# Patient Record
Sex: Female | Born: 1995 | Race: Black or African American | Hispanic: No | Smoking: Current every day smoker
Health system: Southern US, Community
[De-identification: ages and names within clinical notes are randomized; demographics above are authoritative.]

## PROBLEM LIST (undated history)

## (undated) ENCOUNTER — Inpatient Hospital Stay: Payer: Self-pay

## (undated) DIAGNOSIS — Z789 Other specified health status: Secondary | ICD-10-CM

## (undated) DIAGNOSIS — Z8759 Personal history of other complications of pregnancy, childbirth and the puerperium: Secondary | ICD-10-CM

## (undated) DIAGNOSIS — F329 Major depressive disorder, single episode, unspecified: Secondary | ICD-10-CM

## (undated) DIAGNOSIS — F32A Depression, unspecified: Secondary | ICD-10-CM

## (undated) DIAGNOSIS — K219 Gastro-esophageal reflux disease without esophagitis: Secondary | ICD-10-CM

---
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
## 2015-02-19 ENCOUNTER — Emergency Department
Admission: EM | Admit: 2015-02-19 | Discharge: 2015-02-19 | Disposition: A | Discharge status: Home / Self Care | Payer: Medicaid Other | Attending: Emergency Medicine | Admitting: Emergency Medicine

## 2015-02-19 ENCOUNTER — Encounter: Payer: Self-pay | Admitting: *Deleted

## 2015-02-19 DIAGNOSIS — R1084 Generalized abdominal pain: Secondary | ICD-10-CM | POA: Diagnosis not present

## 2015-02-19 DIAGNOSIS — Z3202 Encounter for pregnancy test, result negative: Secondary | ICD-10-CM | POA: Insufficient documentation

## 2015-02-19 DIAGNOSIS — R1110 Vomiting, unspecified: Secondary | ICD-10-CM

## 2015-02-19 DIAGNOSIS — R197 Diarrhea, unspecified: Secondary | ICD-10-CM | POA: Insufficient documentation

## 2015-02-19 DIAGNOSIS — Z79899 Other long term (current) drug therapy: Secondary | ICD-10-CM | POA: Insufficient documentation

## 2015-02-19 LAB — URINALYSIS COMPLETE WITH MICROSCOPIC (ARMC ONLY)
Bilirubin Urine: NEGATIVE
GLUCOSE, UA: NEGATIVE mg/dL
Ketones, ur: NEGATIVE mg/dL
LEUKOCYTES UA: NEGATIVE
NITRITE: NEGATIVE
PROTEIN: 30 mg/dL — AB
SPECIFIC GRAVITY, URINE: 1.029 (ref 1.005–1.030)
pH: 6 (ref 5.0–8.0)

## 2015-02-19 LAB — COMPREHENSIVE METABOLIC PANEL
ALK PHOS: 59 U/L (ref 38–126)
ALT: 20 U/L (ref 14–54)
ANION GAP: 9 (ref 5–15)
AST: 25 U/L (ref 15–41)
Albumin: 4.4 g/dL (ref 3.5–5.0)
BILIRUBIN TOTAL: 0.9 mg/dL (ref 0.3–1.2)
BUN: 13 mg/dL (ref 6–20)
CO2: 22 mmol/L (ref 22–32)
Calcium: 9.2 mg/dL (ref 8.9–10.3)
Chloride: 106 mmol/L (ref 101–111)
Creatinine, Ser: 0.74 mg/dL (ref 0.44–1.00)
Glucose, Bld: 119 mg/dL — ABNORMAL HIGH (ref 65–99)
Potassium: 3.9 mmol/L (ref 3.5–5.1)
Sodium: 137 mmol/L (ref 135–145)
TOTAL PROTEIN: 8.1 g/dL (ref 6.5–8.1)

## 2015-02-19 LAB — LIPASE, BLOOD: Lipase: 20 U/L (ref 11–51)

## 2015-02-19 LAB — CBC
HCT: 42.5 % (ref 35.0–47.0)
HEMOGLOBIN: 14 g/dL (ref 12.0–16.0)
MCH: 27.3 pg (ref 26.0–34.0)
MCHC: 32.9 g/dL (ref 32.0–36.0)
MCV: 82.9 fL (ref 80.0–100.0)
Platelets: 177 10*3/uL (ref 150–440)
RBC: 5.12 MIL/uL (ref 3.80–5.20)
RDW: 15.5 % — AB (ref 11.5–14.5)
WBC: 5.2 10*3/uL (ref 3.6–11.0)

## 2015-02-19 LAB — POCT PREGNANCY, URINE: Preg Test, Ur: NEGATIVE

## 2015-02-19 MED ORDER — ONDANSETRON 4 MG PO TBDP
4.0000 mg | ORAL_TABLET | Freq: Once | ORAL | Status: AC
  Administered 2015-02-19: 4 mg via ORAL

## 2015-02-19 MED ORDER — ONDANSETRON 4 MG PO TBDP
4.0000 mg | ORAL_TABLET | Freq: Once | ORAL | Status: DC | PRN
  Filled 2015-02-19: qty 1

## 2015-02-19 MED ORDER — LOPERAMIDE HCL 2 MG PO CAPS
4.0000 mg | ORAL_CAPSULE | Freq: Once | ORAL | Status: AC
  Administered 2015-02-19: 4 mg via ORAL
  Filled 2015-02-19 (×2): qty 2
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
## 2015-02-19 NOTE — ED Notes (Signed)
AAOx3.  Skin warm and dry.  NAD.  Ambulates with easy and steady gait.  Patient goind home with Taxi.  Voucher given to First Nurse.

## 2015-02-19 NOTE — Discharge Instructions (Signed)

## 2015-02-19 NOTE — ED Notes (Signed)
Pt states vomiting and diaherra since last night, awake and alert in no acute distress

## 2015-02-20 ENCOUNTER — Emergency Department
Admission: EM | Admit: 2015-02-20 | Discharge: 2015-02-21 | Disposition: A | Discharge status: Home / Self Care | Payer: Medicaid Other

## 2015-04-09 ENCOUNTER — Encounter: Payer: Self-pay | Admitting: Emergency Medicine

## 2015-04-09 ENCOUNTER — Emergency Department
Admission: EM | Admit: 2015-04-09 | Discharge: 2015-04-09 | Disposition: A | Discharge status: Home / Self Care | Payer: Medicaid Other | Attending: Emergency Medicine | Admitting: Emergency Medicine

## 2015-04-09 DIAGNOSIS — J020 Streptococcal pharyngitis: Secondary | ICD-10-CM | POA: Insufficient documentation

## 2015-04-09 DIAGNOSIS — J029 Acute pharyngitis, unspecified: Secondary | ICD-10-CM | POA: Diagnosis present

## 2015-04-09 LAB — POCT RAPID STREP A: Streptococcus, Group A Screen (Direct): POSITIVE — AB

## 2015-04-09 MED ORDER — ACETAMINOPHEN 325 MG PO TABS
650.0000 mg | ORAL_TABLET | Freq: Once | ORAL | Status: AC | PRN
  Administered 2015-04-09: 650 mg via ORAL
  Filled 2015-04-09: qty 2

## 2015-04-09 MED ORDER — AMOXICILLIN 250 MG PO CHEW: 500.0000 mg | CHEWABLE_TABLET | Freq: Three times a day (TID) | ORAL | Status: DC

## 2015-04-09 MED ORDER — IBUPROFEN 600 MG PO TABS: 600.0000 mg | ORAL_TABLET | Freq: Four times a day (QID) | ORAL | Status: DC | PRN

## 2015-04-09 NOTE — ED Notes (Signed)
Pt to ed with c/o sore throat, cough, congestion since last night. Pt states body aches at this time.

## 2015-04-09 NOTE — ED Notes (Signed)
AAOx3.  Skin warm and dry.  NAD 

## 2015-04-09 NOTE — ED Notes (Signed)
Sore throat since yesterday.  Swollen throat.

## 2015-04-09 NOTE — ED Provider Notes (Signed)
CSN: [id number]     Arrival date & time 04/09/15  1331 History   First MD Initiated Contact with Patient 04/09/15 1517     Chief Complaint  Patient presents with  . Sore Throat     (Consider location/radiation/quality/duration/timing/severity/associated sxs/prior Treatment) HPI 20 year old female who presents to the emergency department with a sore throat, occasional cough, and congestion since last night. She is also complaining of generalized body aches. She has had no known sick exposures. She has not taken any medications to relieve her symptoms. History reviewed. No pertinent past medical history. History reviewed. No pertinent past surgical history. No family history on file. Social History  Substance Use Topics  . Smoking status: Never Smoker   . Smokeless tobacco: Never Used  . Alcohol Use: No   OB History    Gravida Para Term Preterm AB TAB SAB Ectopic Multiple Living   1              Review of Systems  Constitutional: Positive for fever and activity change.  HENT: Positive for sore throat. Negative for ear pain, rhinorrhea and trouble swallowing.   Eyes: Negative.   Respiratory: Positive for cough. Negative for shortness of breath.   Gastrointestinal: Negative for abdominal pain.  Musculoskeletal: Positive for myalgias.  Skin: Negative.   Neurological: Negative.       Allergies  Review of patient's allergies indicates no known allergies.  Home Medications   Prior to Admission medications   Medication Sig Start Date End Date Taking? Authorizing Provider  amoxicillin (AMOXIL) 250 MG chewable tablet Chew 2 tablets (500 mg total) by mouth 3 (three) times daily. 04/09/15   Chinita Pesterari B Shaleka Brines, FNP  ibuprofen (ADVIL,MOTRIN) 600 MG tablet Take 1 tablet (600 mg total) by mouth every 6 (six) hours as needed. 04/09/15   Chinita Pesterari B Kathie Posa, FNP  Prenatal Vit-Fe Fumarate-FA (PRENATAL MULTIVITAMIN) TABS tablet Take 1 tablet by mouth daily at 12 noon.    Historical Provider, MD   BP  125/74 mmHg  Pulse 110  Temp(Src) 100.5 F (38.1 C) (Oral)  Resp 16  Ht 5\' 2"  (1.575 m)  Wt 65.772 kg  BMI 26.51 kg/m2  SpO2 98%  LMP 03/15/2014 Physical Exam  Constitutional: She is oriented to person, place, and time. She appears well-developed and well-nourished.  HENT:  Head: Atraumatic.  Right Ear: External ear normal.  Left Ear: External ear normal.  Mouth/Throat: Uvula is midline and mucous membranes are normal. Oropharyngeal exudate and posterior oropharyngeal erythema present. No posterior oropharyngeal edema or tonsillar abscesses.  Neck: Normal range of motion.  Pulmonary/Chest: Effort normal and breath sounds normal.  Abdominal: Soft. There is no tenderness.  Musculoskeletal: Normal range of motion.  Lymphadenopathy:    She has no cervical adenopathy.  Neurological: She is alert and oriented to person, place, and time.  Skin: Skin is warm and dry.  Psychiatric: Her behavior is normal. Thought content normal.    ED Course  Procedures (including critical care time) Labs Review Labs Reviewed  POCT RAPID STREP A - Abnormal; Notable for the following:    Streptococcus, Group A Screen (Direct) POSITIVE (*)    All other components within normal limits    Imaging Review No results found. I have personally reviewed and evaluated these images and lab results as part of my medical decision-making.   EKG Interpretation None      MDM   Final diagnoses:  Strep pharyngitis    Patient was given a prescription for amoxicillin and ibuprofen.  She was encouraged to take the amoxicillin until-. She was advised to follow-up with her primary care provider for symptoms that are not improving over the next 48-72 hours. She was encouraged to return to the emergency department for symptoms that change or worsen if she is unable schedule an appointment.    Chinita Pester, FNP 04/09/15 1542  Phineas Semen, MD 04/09/15 [phone]

## 2015-04-15 ENCOUNTER — Emergency Department: Payer: Medicaid Other

## 2015-04-15 ENCOUNTER — Emergency Department
Admission: EM | Admit: 2015-04-15 | Discharge: 2015-04-15 | Disposition: A | Discharge status: Home / Self Care | Payer: Medicaid Other | Attending: Emergency Medicine | Admitting: Emergency Medicine

## 2015-04-15 ENCOUNTER — Encounter: Payer: Self-pay | Admitting: Emergency Medicine

## 2015-04-15 DIAGNOSIS — W230XXA Caught, crushed, jammed, or pinched between moving objects, initial encounter: Secondary | ICD-10-CM | POA: Insufficient documentation

## 2015-04-15 DIAGNOSIS — M25572 Pain in left ankle and joints of left foot: Secondary | ICD-10-CM | POA: Diagnosis present

## 2015-04-15 DIAGNOSIS — S9002XA Contusion of left ankle, initial encounter: Secondary | ICD-10-CM | POA: Insufficient documentation

## 2015-04-15 DIAGNOSIS — Y999 Unspecified external cause status: Secondary | ICD-10-CM | POA: Insufficient documentation

## 2015-04-15 DIAGNOSIS — Y929 Unspecified place or not applicable: Secondary | ICD-10-CM | POA: Insufficient documentation

## 2015-04-15 DIAGNOSIS — S9032XA Contusion of left foot, initial encounter: Secondary | ICD-10-CM

## 2015-04-15 DIAGNOSIS — Y9389 Activity, other specified: Secondary | ICD-10-CM | POA: Diagnosis not present

## 2015-04-15 MED ORDER — IBUPROFEN 600 MG PO TABS: 600.0000 mg | ORAL_TABLET | Freq: Three times a day (TID) | ORAL | Status: DC | PRN

## 2015-04-15 NOTE — Discharge Instructions (Signed)
Contusion A contusion is a deep bruise. Contusions happen when an injury causes bleeding under the skin. Symptoms of bruising include pain, swelling, and discolored skin. The skin may turn blue, purple, or yellow. HOME CARE   Rest the injured area.  If told, put ice on the injured area.  Put ice in a plastic bag.  Place a towel between your skin and the bag.  Leave the ice on for 20 minutes, 2-3 times per day.  If told, put light pressure (compression) on the injured area using an elastic bandage. Make sure the bandage is not too tight. Remove it and put it back on as told by your doctor.  If possible, raise (elevate) the injured area above the level of your heart while you are sitting or lying down.  Take over-the-counter and prescription medicines only as told by your doctor. GET HELP IF:  Your symptoms do not get better after several days of treatment.  Your symptoms get worse.  You have trouble moving the injured area. GET HELP RIGHT AWAY IF:   You have very bad pain.  You have a loss of feeling (numbness) in a hand or foot.  Your hand or foot turns pale or cold.   This information is not intended to replace advice given to you by your health care provider. Make sure you discuss any questions you have with your health care provider.   Document Released: 06/11/2007 Document Revised: 09/13/2014 Document Reviewed: 05/10/2014 Elsevier Interactive Patient Education 2016 [employer]vier Inc.  Cryotherapy Cryotherapy is when you put ice on your injury. Ice helps lessen pain and puffiness (swelling) after an injury. Ice works the best when you start using it in the first 24 to 48 hours after an injury. HOME CARE  Put a dry or damp towel between the ice pack and your skin.  You may press gently on the ice pack.  Leave the ice on for no more than 10 to 20 minutes at a time.  Check your skin after 5 minutes to make sure your skin is okay.  Rest at least 20 minutes between ice  pack uses.  Stop using ice when your skin loses feeling (numbness).  Do not use ice on someone who cannot tell you when it hurts. This includes small children and people with memory problems (dementia). GET HELP RIGHT AWAY IF:  You have white spots on your skin.  Your skin turns blue or pale.  Your skin feels waxy or hard.  Your puffiness gets worse. MAKE SURE YOU:   Understand these instructions.  Will watch your condition.  Will get help right away if you are not doing well or get worse.   This information is not intended to replace advice given to you by your health care provider. Make sure you discuss any questions you have with your health care provider.   Document Released: 06/11/2007 Document Revised: 03/17/2011 Document Reviewed: 08/15/2010 Elsevier Interactive Patient Education 2016 [employer]   Ice and elevate if needed for pain or swelling. Take ibuprofen as needed for pain and inflammation. Follow[facility] with your primary care doctor or [facility] if any continued problems.
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
## 2015-05-07 ENCOUNTER — Encounter: Payer: Self-pay | Admitting: Emergency Medicine

## 2015-05-07 ENCOUNTER — Emergency Department
Admission: EM | Admit: 2015-05-07 | Discharge: 2015-05-07 | Disposition: A | Discharge status: Home / Self Care | Payer: Medicaid Other | Attending: Emergency Medicine | Admitting: Emergency Medicine

## 2015-05-07 ENCOUNTER — Emergency Department: Payer: Medicaid Other

## 2015-05-07 DIAGNOSIS — N939 Abnormal uterine and vaginal bleeding, unspecified: Secondary | ICD-10-CM

## 2015-05-07 DIAGNOSIS — O039 Complete or unspecified spontaneous abortion without complication: Secondary | ICD-10-CM

## 2015-05-07 DIAGNOSIS — O209 Hemorrhage in early pregnancy, unspecified: Secondary | ICD-10-CM | POA: Diagnosis present

## 2015-05-07 LAB — CBC WITH DIFFERENTIAL/PLATELET
BASOS PCT: 1 %
Basophils Absolute: 0.1 10*3/uL (ref 0–0.1)
EOS PCT: 3 %
Eosinophils Absolute: 0.2 10*3/uL (ref 0–0.7)
HCT: 41.1 % (ref 35.0–47.0)
Hemoglobin: 13.7 g/dL (ref 12.0–16.0)
LYMPHS ABS: 3.2 10*3/uL (ref 1.0–3.6)
Lymphocytes Relative: 57 %
MCH: 28.3 pg (ref 26.0–34.0)
MCHC: 33.4 g/dL (ref 32.0–36.0)
MCV: 84.7 fL (ref 80.0–100.0)
MONOS PCT: 9 %
Monocytes Absolute: 0.5 10*3/uL (ref 0.2–0.9)
NEUTROS ABS: 1.7 10*3/uL (ref 1.4–6.5)
Neutrophils Relative %: 30 %
PLATELETS: 167 10*3/uL (ref 150–440)
RBC: 4.85 MIL/uL (ref 3.80–5.20)
RDW: 14.3 % (ref 11.5–14.5)
WBC: 5.7 10*3/uL (ref 3.6–11.0)

## 2015-05-07 LAB — ABO/RH: ABO/RH(D): O POS

## 2015-05-07 LAB — HCG, QUANTITATIVE, PREGNANCY: hCG, Beta Chain, Quant, S: <1 m[IU]/mL (ref <5)

## 2015-05-07 NOTE — ED Notes (Addendum)
Pt states she is [redacted] weeks pregnant has had light bleeding for the past week and woke up this morning with heavier vaginal bleeding and lower abd cramping.the patient is in NAD on arrival..

## 2015-05-07 NOTE — Discharge Instructions (Signed)
Miscarriage  A miscarriage is the sudden loss of an unborn baby (fetus) before the 20th week of pregnancy. Most miscarriages happen in the first 3 months of pregnancy. Sometimes, it happens before a woman even knows she is pregnant. A miscarriage is also called a "spontaneous miscarriage" or "early pregnancy loss." Having a miscarriage can be an emotional experience. Talk with your caregiver about any questions you may have about miscarrying, the grieving process, and your future pregnancy plans.  CAUSES    Problems with the fetal chromosomes that make it impossible for the baby to develop normally. Problems with the baby's genes or chromosomes are most often the result of errors that occur, by chance, as the embryo divides and grows. The problems are not inherited from the parents.   Infection of the cervix or uterus.    Hormone problems.    Problems with the cervix, such as having an incompetent cervix. This is when the tissue in the cervix is not strong enough to hold the pregnancy.    Problems with the uterus, such as an abnormally shaped uterus, uterine fibroids, or congenital abnormalities.    Certain medical conditions.    Smoking, drinking alcohol, or taking illegal drugs.    Trauma.   Often, the cause of a miscarriage is unknown.   SYMPTOMS    Vaginal bleeding or spotting, with or without cramps or pain.   Pain or cramping in the abdomen or lower back.   Passing fluid, tissue, or blood clots from the vagina.  DIAGNOSIS   Your caregiver will perform a physical exam. You may also have an ultrasound to confirm the miscarriage. Blood or urine tests may also be ordered.  TREATMENT    Sometimes, treatment is not necessary if you naturally pass all the fetal tissue that was in the uterus. If some of the fetus or placenta remains in the body (incomplete miscarriage), tissue left behind may become infected and must be removed. Usually, a dilation and curettage (D and C) procedure is performed.  During a D and C procedure, the cervix is widened (dilated) and any remaining fetal or placental tissue is gently removed from the uterus.   Antibiotic medicines are prescribed if there is an infection. Other medicines may be given to reduce the size of the uterus (contract) if there is a lot of bleeding.   If you have Rh negative blood and your baby was Rh positive, you will need a Rh immunoglobulin shot. This shot will protect any future baby from having Rh blood problems in future pregnancies.  HOME CARE INSTRUCTIONS    Your caregiver may order bed rest or may allow you to continue light activity. Resume activity as directed by your caregiver.   Have someone help with home and family responsibilities during this time.    Keep track of the number of sanitary pads you use each day and how soaked (saturated) they are. Write down this information.    Do not use tampons. Do not douche or have sexual intercourse until approved by your caregiver.    Only take over-the-counter or prescription medicines for pain or discomfort as directed by your caregiver.    Do not take aspirin. Aspirin can cause bleeding.    Keep all follow-up appointments with your caregiver.    If you or your partner have problems with grieving, talk to your caregiver or seek counseling to help cope with the pregnancy loss. Allow enough time to grieve before trying to get pregnant again.     SEEK IMMEDIATE MEDICAL CARE IF:    You have severe cramps or pain in your back or abdomen.   You have a fever.   You pass large blood clots (walnut-sized or larger) ortissue from your vagina. Save any tissue for your caregiver to inspect.    Your bleeding increases.    You have a thick, bad-smelling vaginal discharge.   You become lightheaded, weak, or you faint.    You have chills.   MAKE SURE YOU:   Understand these instructions.   Will watch your condition.   Will get help right away if you are not doing well or get worse.     This  information is not intended to replace advice given to you by your health care provider. Make sure you discuss any questions you have with your health care provider.     Document Released: 06/18/2000 Document Revised: 04/19/2012 Document Reviewed: 02/11/2011  Elsevier Interactive Patient Education 2016 [employer]

## 2015-05-07 NOTE — ED Provider Notes (Signed)
[facility] Emergency Department Provider Note  ____________________________________________  Time seen: 9:20 AM  I have reviewed the triage vital signs and the nursing notes.   HISTORY  Chief Complaint Vaginal Bleeding    HPI Maria Richmond is a 20 y.o. female who reports being about [redacted] weeks pregnant. She says she's had light vaginal bleeding for the past week, which today became heavy. She also had some abdominal cramping. No chest pain shortness of breath fevers chills or dizziness. As a history of placenta previa in a previous pregnancy one year ago.     History reviewed. No pertinent past medical history.   There are no active problems to display for this patient.    Past Surgical History  Procedure Laterality Date  . Cesarean section       No current outpatient prescriptions on file. None  Allergies Review of patient's allergies indicates no known allergies.   No family history on file.  Social History Social History  Substance Use Topics  . Smoking status: Never Smoker   . Smokeless tobacco: None  . Alcohol Use: No    Review of Systems  Constitutional:   No fever or chills.  Eyes:   No vision changes.  ENT:   No sore throat. No rhinorrhea. Cardiovascular:   No chest pain. Respiratory:   No dyspnea or cough. Gastrointestinal:   Negative for abdominal pain, vomiting and diarrhea.  Genitourinary:   Negative for dysuria or difficulty urinating. Musculoskeletal:   Negative for focal pain or swelling Neurological:   Negative for headaches 10-point ROS otherwise negative.  ____________________________________________   PHYSICAL EXAM:  VITAL SIGNS: ED Triage Vitals  Enc Vitals Group     BP 05/07/15 0744 131/109 mmHg     Pulse Rate 05/07/15 0744 82     Resp 05/07/15 0744 16     Temp 05/07/15 0744 98.1 F (36.7 C)     Temp Source 05/07/15 0744 Oral     SpO2 05/07/15 0744 100 %     Weight 05/07/15 0741 140 lb (63.504  kg)     Height 05/07/15 0741 5\' 2"  (1.575 m)     Head Cir --      Peak Flow --      Pain Score 05/07/15 0741 7     Pain Loc --      Pain Edu? --      Excl. in GC? --     Vital signs reviewed, nursing assessments reviewed.   Constitutional:   Alert and oriented. Well appearing and in no distress. Eyes:   No scleral icterus. No conjunctival pallor. PERRL. EOMI.  No nystagmus. ENT   Head:   Normocephalic and atraumatic.   Nose:   No congestion/rhinnorhea. No septal hematoma   Mouth/Throat:   MMM, no pharyngeal erythema. No peritonsillar mass.    Neck:   No stridor. No SubQ emphysema. No meningismus. Hematological/Lymphatic/Immunilogical:   No cervical lymphadenopathy. Cardiovascular:   RRR. Symmetric bilateral radial and DP pulses.  No murmurs.  Respiratory:   Normal respiratory effort without tachypnea nor retractions. Breath sounds are clear and equal bilaterally. No wheezes/rales/rhonchi. Gastrointestinal:   Soft and nontender. Non distended. There is no CVA tenderness.  No rebound, rigidity, or guarding. Genitourinary:   deferred Musculoskeletal:   Nontender with normal range of motion in all extremities. No joint effusions.  No lower extremity tenderness.  No edema. Neurologic:   Normal speech and language.  CN 2-10 normal. Motor grossly intact. No gross focal neurologic  deficits are appreciated.  Skin:    Skin is warm, dry and intact. No rash noted.  No petechiae, purpura, or bullae.  ____________________________________________    LABS (pertinent positives/negatives) (all labs ordered are listed, but only abnormal results are displayed) Labs Reviewed  HCG, QUANTITATIVE, PREGNANCY  CBC WITH DIFFERENTIAL/PLATELET  URINALYSIS COMPLETEWITH MICROSCOPIC ([facility] ONLY)  ABO/RH   ____________________________________________   EKG    ____________________________________________    RADIOLOGY  Ultrasound pelvis unremarkable. No retained POC's, no evidence of  ectopic.  ____________________________________________   PROCEDURES   ____________________________________________   INITIAL IMPRESSION / ASSESSMENT AND PLAN / ED COURSE  Pertinent labs & imaging results that were available during my care of the patient were reviewed by me and considered in my medical decision making (see chart for details).  Patient well appearing no acute distress. Reports pregnancy with vaginal bleeding. HCG is negative. Ultrasound unremarkable, blood type is Rh+. Follow-up with primary care, currently stable. No evidence of ectopic. Low suspicion for STI PID or torsion. No evidence of appendicitis.     ____________________________________________   FINAL CLINICAL IMPRESSION(S) / ED DIAGNOSES  Final diagnoses:  Vaginal bleeding  Spontaneous miscarriage       Portions of this note were generated with dragon dictation software. Dictation errors may occur despite best attempts at proofreading.   Sharman Cheek, MD 05/07/15 1137

## 2015-12-09 ENCOUNTER — Emergency Department
Admission: EM | Admit: 2015-12-09 | Discharge: 2015-12-09 | Disposition: A | Discharge status: Home / Self Care | Payer: Medicaid Other | Attending: Emergency Medicine | Admitting: Emergency Medicine

## 2015-12-09 ENCOUNTER — Encounter: Payer: Self-pay | Admitting: Emergency Medicine

## 2015-12-09 DIAGNOSIS — F17210 Nicotine dependence, cigarettes, uncomplicated: Secondary | ICD-10-CM | POA: Diagnosis not present

## 2015-12-09 DIAGNOSIS — Z791 Long term (current) use of non-steroidal anti-inflammatories (NSAID): Secondary | ICD-10-CM | POA: Diagnosis not present

## 2015-12-09 DIAGNOSIS — B349 Viral infection, unspecified: Secondary | ICD-10-CM | POA: Insufficient documentation

## 2015-12-09 DIAGNOSIS — J029 Acute pharyngitis, unspecified: Secondary | ICD-10-CM | POA: Diagnosis present

## 2015-12-09 DIAGNOSIS — Z792 Long term (current) use of antibiotics: Secondary | ICD-10-CM | POA: Insufficient documentation

## 2015-12-09 LAB — INFLUENZA PANEL BY PCR (TYPE A & B)
INFLAPCR: NEGATIVE
Influenza B By PCR: NEGATIVE

## 2015-12-09 MED ORDER — KETOROLAC TROMETHAMINE 60 MG/2ML IM SOLN: 60.0000 mg | Freq: Once | INTRAMUSCULAR | Status: DC

## 2015-12-09 MED ORDER — NAPROXEN 500 MG PO TBEC: 500.0000 mg | DELAYED_RELEASE_TABLET | Freq: Two times a day (BID) | ORAL | 0 refills | Status: DC

## 2015-12-09 NOTE — ED Provider Notes (Signed)
[facility] Emergency Department Provider Note  ____________________________________________  Time seen: Approximately 10:56 PM  I have reviewed the triage vital signs and the nursing notes.   HISTORY  Chief Complaint Influenza   HPI Maria Richmond is a 20 y.o. female resenting with pharyngitis, headache, nonproductive cough and rhinorrhea for one day. Patient states that her boyfriend has the exact same symptoms. She denies chest pain, shortness of breath, hematemesis, nausea, vomiting or abdominal pain. She denies attempting alleviating factors. She denies recent travel and fever.   History reviewed. No pertinent past medical history.  Patient Active Problem List   Diagnosis Date Noted  . Preterm premature rupture of membranes (PPROM) with unknown onset of labor 07/17/2014  . Decreased amniotic fluid 07/04/2014    Past Surgical History:  Procedure Laterality Date  . CESAREAN SECTION      Prior to Admission medications   Medication Sig Start Date End Date Taking? Authorizing Provider  amoxicillin (AMOXIL) 250 MG chewable tablet Chew 2 tablets (500 mg total) by mouth 3 (three) times daily. 04/09/15   Chinita Pesterari B Triplett, FNP  ibuprofen (ADVIL,MOTRIN) 600 MG tablet Take 1 tablet (600 mg total) by mouth every 8 (eight) hours as needed. 04/15/15   Tommi Rumpshonda L Summers, PA-C  Prenatal Vit-Fe Fumarate-FA (PRENATAL MULTIVITAMIN) TABS tablet Take 1 tablet by mouth daily at 12 noon.    Historical Provider, MD    Allergies Patient has no known allergies.  No family history on file.  Social History Social History  Substance Use Topics  . Smoking status: Current Every Day Smoker    Packs/day: 1.00    Types: Cigarettes  . Smokeless tobacco: Never Used  . Alcohol use Yes    Review of Systems Constitutional: Denies fever/chills ENT: Has sore throat. Cardiovascular: Denies chest pain. Respiratory: Denies shortness of breath. Has cough. Gastrointestinal: Denies  nausea,  denies vomiting.  denies diarrhea.  Musculoskeletal: Postive for body aches Skin: Negative for rash. Neurological: Positive for headaches ____________________________________________   PHYSICAL EXAM:  VITAL SIGNS: ED Triage Vitals  Enc Vitals Group     BP 12/09/15 2115 (!) 132/94     Pulse Rate 12/09/15 2115 (!) 108     Resp 12/09/15 2115 18     Temp 12/09/15 2115 99.3 F (37.4 C)     Temp Source 12/09/15 2115 Oral     SpO2 12/09/15 2115 100 %     Weight 12/09/15 2116 140 lb (63.5 kg)     Height 12/09/15 2116 5\' 2"  (1.575 m)     Head Circumference --      Peak Flow --      Pain Score 12/09/15 2120 6     Pain Loc --      Pain Edu? --      Excl. in GC? --     Constitutional: Alert and oriented. Supine on bed. Eyes: Conjunctivae are normal. EOMI. Ears: Right tympanic membrane is injected without exudate. Left tympanic membrane is without erythema or exudate. Nose: Patient's nasal turbinates are boggy with rhinorrhea visualized on physical exam. Mouth/Throat: Posterior pharynx is mildly erythematous without petechiae or tonsillar exudate. Tonsils 2+. No deviation of the uvula. Neck: No stridor.  Lymphatic: Patient has palpable anterior cervical lymphadenopathy.  Cardiovascular: Normal rate, regular rhythm. Grossly normal heart sounds.  Good peripheral circulation. Respiratory: Normal respiratory effort.  No retractions. CTAB. Gastrointestinal: Soft and nontender.  Musculoskeletal: FROM x 4 extremities.  Neurologic:  Normal speech and language.  Skin:  Skin is warm,  dry and intact. No rash noted. Psychiatric: Mood and affect are normal. Speech and behavior are normal.  ____________________________________________   LABS (all labs ordered are listed, but only abnormal results are displayed)  Labs Reviewed  INFLUENZA PANEL BY PCR (TYPE A & B, H1N1)    PROCEDURES  Procedure(s) performed:    Critical Care performed:  No  ____________________________________________   INITIAL IMPRESSION / ASSESSMENT AND PLAN / ED COURSE  Clinical Course     Pertinent labs & imaging results that were available during my care of the patient were reviewed by me and considered in my medical decision making (see chart for details).  Assessment and Plan:  Upper respiratory tract infection:  Patient has had headache, rhinorrhea and cough for one day. Patient's rapid influenza testing was negative. An upper respiratory tract infection is likely as patient's boyfriend has similar symptoms. Patient was advised to take Tylenol as needed for discomfort. Rest and hydration were encouraged. Vital signs are stable at this time. Strict return precautions were given. ____________________________________________   FINAL CLINICAL IMPRESSION(S) / ED DIAGNOSES  Final diagnoses:  Viral syndrome    Note:  This document was prepared using Dragon voice recognition software and may include unintentional dictation errors.     Orvil FeilJaclyn M Woods, PA-C 12/09/15 2318    Sharman CheekPhillip Stafford, MD 12/13/15 [phone]

## 2015-12-09 NOTE — ED Triage Notes (Signed)
Pt presents with boyfriend to be checked for possible flu. Pt is congested at this time. Pt ambulatory to triage with NAD noted at this time.

## 2015-12-20 ENCOUNTER — Emergency Department: Payer: Medicaid Other

## 2015-12-20 ENCOUNTER — Emergency Department
Admission: EM | Admit: 2015-12-20 | Discharge: 2015-12-20 | Disposition: A | Discharge status: Home / Self Care | Payer: Medicaid Other | Attending: Emergency Medicine | Admitting: Emergency Medicine

## 2015-12-20 DIAGNOSIS — B9689 Other specified bacterial agents as the cause of diseases classified elsewhere: Secondary | ICD-10-CM

## 2015-12-20 DIAGNOSIS — F17210 Nicotine dependence, cigarettes, uncomplicated: Secondary | ICD-10-CM | POA: Insufficient documentation

## 2015-12-20 DIAGNOSIS — Z3A01 Less than 8 weeks gestation of pregnancy: Secondary | ICD-10-CM | POA: Insufficient documentation

## 2015-12-20 DIAGNOSIS — N898 Other specified noninflammatory disorders of vagina: Secondary | ICD-10-CM | POA: Diagnosis not present

## 2015-12-20 DIAGNOSIS — R102 Pelvic and perineal pain: Secondary | ICD-10-CM

## 2015-12-20 DIAGNOSIS — O418X10 Other specified disorders of amniotic fluid and membranes, first trimester, not applicable or unspecified: Secondary | ICD-10-CM

## 2015-12-20 DIAGNOSIS — O99331 Smoking (tobacco) complicating pregnancy, first trimester: Secondary | ICD-10-CM | POA: Diagnosis not present

## 2015-12-20 DIAGNOSIS — O209 Hemorrhage in early pregnancy, unspecified: Secondary | ICD-10-CM | POA: Diagnosis present

## 2015-12-20 DIAGNOSIS — O23591 Infection of other part of genital tract in pregnancy, first trimester: Secondary | ICD-10-CM | POA: Diagnosis not present

## 2015-12-20 DIAGNOSIS — O208 Other hemorrhage in early pregnancy: Secondary | ICD-10-CM | POA: Diagnosis not present

## 2015-12-20 DIAGNOSIS — N760 Acute vaginitis: Secondary | ICD-10-CM

## 2015-12-20 DIAGNOSIS — O468X1 Other antepartum hemorrhage, first trimester: Secondary | ICD-10-CM

## 2015-12-20 LAB — CBC
HCT: 42.8 % (ref 35.0–47.0)
HEMOGLOBIN: 14.3 g/dL (ref 12.0–16.0)
MCH: 30 pg (ref 26.0–34.0)
MCHC: 33.5 g/dL (ref 32.0–36.0)
MCV: 89.8 fL (ref 80.0–100.0)
Platelets: 182 10*3/uL (ref 150–440)
RBC: 4.77 MIL/uL (ref 3.80–5.20)
RDW: 12.9 % (ref 11.5–14.5)
WBC: 6.5 10*3/uL (ref 3.6–11.0)

## 2015-12-20 LAB — URINALYSIS, COMPLETE (UACMP) WITH MICROSCOPIC
BILIRUBIN URINE: NEGATIVE
Bacteria, UA: NONE SEEN
GLUCOSE, UA: NEGATIVE mg/dL
Hgb urine dipstick: NEGATIVE
KETONES UR: NEGATIVE mg/dL
LEUKOCYTES UA: NEGATIVE
Nitrite: NEGATIVE
PH: 6 (ref 5.0–8.0)
Protein, ur: NEGATIVE mg/dL
SPECIFIC GRAVITY, URINE: 1.019 (ref 1.005–1.030)

## 2015-12-20 LAB — BASIC METABOLIC PANEL
Anion gap: 8 (ref 5–15)
BUN: 9 mg/dL (ref 6–20)
CHLORIDE: 104 mmol/L (ref 101–111)
CO2: 23 mmol/L (ref 22–32)
CREATININE: 0.7 mg/dL (ref 0.44–1.00)
Calcium: 9.4 mg/dL (ref 8.9–10.3)
GFR calc Af Amer: >60 mL/min (ref >60)
GFR calc non Af Amer: >60 mL/min (ref >60)
GLUCOSE: 81 mg/dL (ref 65–99)
Potassium: 3.7 mmol/L (ref 3.5–5.1)
SODIUM: 135 mmol/L (ref 135–145)

## 2015-12-20 LAB — WET PREP, GENITAL
SPERM: NONE SEEN
Trich, Wet Prep: NONE SEEN
YEAST WET PREP: NONE SEEN

## 2015-12-20 LAB — HCG, QUANTITATIVE, PREGNANCY: HCG, BETA CHAIN, QUANT, S: 50138 m[IU]/mL — AB (ref <5)

## 2015-12-20 MED ORDER — METRONIDAZOLE 500 MG PO TABS: 500.0000 mg | ORAL_TABLET | Freq: Two times a day (BID) | ORAL | 0 refills | Status: DC

## 2015-12-20 MED ORDER — ACETAMINOPHEN 325 MG PO TABS
650.0000 mg | ORAL_TABLET | Freq: Once | ORAL | Status: AC
  Administered 2015-12-20: 650 mg via ORAL
  Filled 2015-12-20: qty 2

## 2015-12-20 NOTE — Discharge Instructions (Addendum)
Please make an appointment to follow up with Dr. [name], the OB/GYN on call.  Continue to take prenatal vitamins, drink plenty of fluid, and get plenty of rest.  Return to the emergency department if you develop severe pain, increased bleeding, lightheadedness, shortness of breath or fainting, fever, or any other symptoms concerning to you.

## 2015-12-20 NOTE — ED Notes (Addendum)
Patient transported to Ultrasound 

## 2015-12-20 NOTE — ED Provider Notes (Signed)
[facility] Emergency Department Provider Note  ____________________________________________  Time seen: Approximately 7:18 PM  I have reviewed the triage vital signs and the nursing notes.   HISTORY  Chief Complaint Vaginal Bleeding    HPI Maria Richmond is a 20 y.o. female G3 A1 P0 (one early term labor loss) approximately [redacted] weeks pregnant presenting with vaginal bleeding. The patient reports that she was napping earlier today when she awoke with pelvic cramping and associated bleeding. She describes clots but no passage of tissue. She denies lightheadedness, fever or shortness of breath. No syncope.   History reviewed. No pertinent past medical history.  Patient Active Problem List   Diagnosis Date Noted  . Preterm premature rupture of membranes (PPROM) with unknown onset of labor 07/17/2014  . Decreased amniotic fluid 07/04/2014    Past Surgical History:  Procedure Laterality Date  . CESAREAN SECTION      Current Outpatient Rx  . Order #: [id number] Class: Print  . Order #: [id number] Class: Print  . Order #: [id number] Class: Print  . Order #: [id number] Class: Historical Med    Allergies Patient has no known allergies.  No family history on file.  Social History Social History  Substance Use Topics  . Smoking status: Current Every Day Smoker    Packs/day: 1.00    Types: Cigarettes  . Smokeless tobacco: Never Used  . Alcohol use Yes    Review of Systems Constitutional: No fever/chills.No lightheadedness or syncope. Eyes: No visual changes. ENT: No sore throat. No congestion or rhinorrhea. Cardiovascular: Denies chest pain. Denies palpitations. Respiratory: Denies shortness of breath.  No cough. Gastrointestinal: Positive abdominal cramping.  No nausea, no vomiting.  No diarrhea.  No constipation. Genitourinary: Negative for dysuria. Positive vaginal bleeding. Musculoskeletal: Negative for back pain. Skin: Negative for  rash. Neurological: Negative for headaches. No focal numbness, tingling or weakness.   10-point ROS otherwise negative.  ____________________________________________   PHYSICAL EXAM:  VITAL SIGNS: ED Triage Vitals  Enc Vitals Group     BP 12/20/15 1620 123/69     Pulse Rate 12/20/15 1620 86     Resp 12/20/15 1620 16     Temp 12/20/15 1620 98.5 F (36.9 C)     Temp Source 12/20/15 1620 Oral     SpO2 12/20/15 1620 100 %     Weight 12/20/15 1622 140 lb (63.5 kg)     Height 12/20/15 1622 5\' 2"  (1.575 m)     Head Circumference --      Peak Flow --      Pain Score 12/20/15 1623 8     Pain Loc --      Pain Edu? --      Excl. in GC? --     Constitutional: Alert and oriented. Well appearing and in no acute distress. Answers questions appropriately. Eyes: Conjunctivae are normalWithout pallor.  EOMI. No scleral icterus. Head: Atraumatic. Nose: No congestion/rhinnorhea. Mouth/Throat: Mucous membranes are moist.  Neck: No stridor.  Supple.   Cardiovascular: Normal rate, regular rhythm. No murmurs, rubs or gallops.  Respiratory: Normal respiratory effort.  No accessory muscle use or retractions. Lungs CTAB.  No wheezes, rales or ronchi. Gastrointestinal: Soft, nontender and nondistended.  No guarding or rebound.  No peritoneal signs. Genitourinary: Normal-appearing external genitalia without lesions. Vaginal exam with Moderate white discharge, normal-appearing cervix, normal vaginal wall tissue. Bimanual exam is negative for CMT, adnexal tenderness to palpation, no palpable masses. Cervix is closed Musculoskeletal: No LE edema.  Neurologic:  A&Ox3.  Speech is  clear.  Face and smile are symmetric.  EOMI.  Moves all extremities well. Skin:  Skin is warm, dry and intact. No rash noted. Psychiatric: Mood and affect are normal. Speech and behavior are normal.  Normal judgement.  ____________________________________________   LABS (all labs ordered are listed, but only abnormal results  are displayed)  Labs Reviewed  WET PREP, GENITAL - Abnormal; Notable for the following:       Result Value   Clue Cells Wet Prep HPF POC PRESENT (*)    WBC, Wet Prep HPF POC FEW (*)    All other components within normal limits  HCG, QUANTITATIVE, PREGNANCY - Abnormal; Notable for the following:    hCG, Beta Chain, Quant, S 50,138 (*)    All other components within normal limits  URINALYSIS, COMPLETE (UACMP) WITH MICROSCOPIC - Abnormal; Notable for the following:    Color, Urine YELLOW (*)    APPearance CLEAR (*)    Squamous Epithelial / LPF 0-5 (*)    All other components within normal limits  CHLAMYDIA/NGC RT PCR ([facility] ONLY)  CBC  BASIC METABOLIC PANEL   ____________________________________________  EKG  Not indicated ____________________________________________  RADIOLOGY  US Ob Comp Less 14 Wks  Result Date: 12/20/2015 CLINICAL DATA:  20 year old pregnant female presents with 1 day of vaginal bleeding. Quantitative beta HCG 50,138. EDC by LMP: 07/22/2016, projecting to an expected gestational age of [redacted] weeks 2 days. EXAM: OBSTETRIC <14 WK [place] AND TRANSVAGINAL OB US TECHNIQUE: Both transabdominal and transvaginal ultrasound examinations were performed for complete evaluation of the gestation as well as the maternal uterus, adnexal regions, and pelvic cul-de-sac. Transvaginal technique was performed to assess early pregnancy. COMPARISON:  No prior scans from this gestation. FINDINGS: Intrauterine gestational sac: Single intrauterine gestational sac appears normal in size, shape and position. Yolk sac:  Not discretely visualized. Embryo: Present. Nonspecific round structure is seen protruding centrally from the embryo. Embryonic Cardiac Activity: Regular rate and rhythm. Embryonic Heart Rate: 121  bpm CRL:  7.3  mm   6 w   4 d                  [place] EDC: 08/10/2016 Subchorionic hemorrhage: Small to moderate perigestational bleed involving approximately 40% of the gestational sac  circumference. Maternal uterus/adnexae: Right ovary measures 2.4 x 2.0 x 2.0 cm. Left ovary measures 3.3 x 1.5 x 3.6 cm and contains a corpus luteum. No suspicious ovarian or adnexal masses. No uterine fibroids are demonstrated. No abnormal free fluid in the pelvis. IMPRESSION: 1. Single living intrauterine gestation at 6 weeks 4 days by crown-rump length, which is discordant with the expected gestational age of [redacted] weeks 2 days by provided menstrual dating. Normal embryonic cardiac activity. Small to moderate perigestational bleed. Consider a follow-up obstetric scan in 4 weeks to document appropriate fetal growth. 2. Nonspecific round structure protrudes centrally from the embryo. Physiologic gut herniation cannot be distinguished from an abdominal wall defect at this early gestational age and attention on follow-up obstetric scans is necessary. 3. No suspicious ovarian or adnexal findings. Electronically Signed   By: Delbert Phenix M.D.   On: 12/20/2015 19:16   US Ob Transvaginal  Result Date: 12/20/2015 CLINICAL DATA:  20 year old pregnant female presents with 1 day of vaginal bleeding. Quantitative beta HCG 50,138. EDC by LMP: 07/22/2016, projecting to an expected gestational age of [redacted] weeks 2 days. EXAM: OBSTETRIC <14 WK [place] AND TRANSVAGINAL OB US TECHNIQUE: Both transabdominal and transvaginal ultrasound examinations were performed for  complete evaluation of the gestation as well as the maternal uterus, adnexal regions, and pelvic cul-de-sac. Transvaginal technique was performed to assess early pregnancy. COMPARISON:  No prior scans from this gestation. FINDINGS: Intrauterine gestational sac: Single intrauterine gestational sac appears normal in size, shape and position. Yolk sac:  Not discretely visualized. Embryo: Present. Nonspecific round structure is seen protruding centrally from the embryo. Embryonic Cardiac Activity: Regular rate and rhythm. Embryonic Heart Rate: 121  bpm CRL:  7.3  mm   6 w   4 d                   [place] EDC: 08/10/2016 Subchorionic hemorrhage: Small to moderate perigestational bleed involving approximately 40% of the gestational sac circumference. Maternal uterus/adnexae: Right ovary measures 2.4 x 2.0 x 2.0 cm. Left ovary measures 3.3 x 1.5 x 3.6 cm and contains a corpus luteum. No suspicious ovarian or adnexal masses. No uterine fibroids are demonstrated. No abnormal free fluid in the pelvis. IMPRESSION: 1. Single living intrauterine gestation at 6 weeks 4 days by crown-rump length, which is discordant with the expected gestational age of [redacted] weeks 2 days by provided menstrual dating. Normal embryonic cardiac activity. Small to moderate perigestational bleed. Consider a follow-up obstetric scan in 4 weeks to document appropriate fetal growth. 2. Nonspecific round structure protrudes centrally from the embryo. Physiologic gut herniation cannot be distinguished from an abdominal wall defect at this early gestational age and attention on follow-up obstetric scans is necessary. 3. No suspicious ovarian or adnexal findings. Electronically Signed   By: Delbert PhenixJason A Poff M.D.   On: 12/20/2015 19:16    ____________________________________________   PROCEDURES  Procedure(s) performed: None  Procedures  Critical Care performed: No ____________________________________________   INITIAL IMPRESSION / ASSESSMENT AND PLAN / ED COURSE  Pertinent labs & imaging results that were available during my care of the patient were reviewed by me and considered in my medical decision making (see chart for details).  20 y.o. patient who is [redacted] weeks pregnant presenting with pelvic cramping and vaginal bleeding area did the patient does not have any signs or symptoms of acute abdomen or hypovolemia. Her hCG is 50,000 and I'm awaiting the results of her ultrasound. We'll perform a pelvic examination, and reevaluate the patient. I'm concerned about ectopic versus spontaneous AB, or first trimester bleeding.  The  patient is Rh+ from previous lab work, several gamma is not indicated today.  ----------------------------------------- 7:30 PM on 12/20/2015 -----------------------------------------  Patient's ultrasound has resulted with the following findings: 1. Single living intrauterine gestation at 6 weeks 4 days by crown-rump length, which is discordant with the expected gestational age of [redacted] weeks 2 days by provided menstrual dating. Normal embryonic cardiac activity. Small to moderate perigestational bleed. Consider a follow-up obstetric scan in 4 weeks to document appropriate fetal growth. 2. Nonspecific round structure protrudes centrally from the embryo. Physiologic gut herniation cannot be distinguished from an abdominal wall defect at this early gestational age and attention on follow-up obstetric scans is necessary. 3. No suspicious ovarian or adnexal findings.  I will speak to Dr. [name], the OB/GYN on-call, to ensure close follow-up.  ----------------------------------------- 8:25 PM on 12/20/2015 -----------------------------------------  I've spoken with Dr. [name], who agrees with the plan for discharge and close follow-up. He will see the patient in the clinic. The patient did test positive for bacterial vaginosis, so I have sent her home with a prescription for 7 day course of Flagyl. She understands return precautions as well as  follow-up instructions. ____________________________________________  FINAL CLINICAL IMPRESSION(S) / ED DIAGNOSES  Final diagnoses:  Subchorionic hemorrhage of placenta in first trimester, single or unspecified fetus  Pelvic cramping  Bacterial vaginosis    Clinical Course       NEW MEDICATIONS STARTED DURING THIS VISIT:  New Prescriptions   METRONIDAZOLE (FLAGYL) 500 MG TABLET    Take 1 tablet (500 mg total) by mouth 2 (two) times daily.      Rockne MenghiniAnne-Caroline Kellianne Ek, MD 12/20/15 2025

## 2015-12-20 NOTE — ED Triage Notes (Signed)
Pt c/o large amount of bright red blood from vagina associated with cramping to lower abdomen today. PT approx [redacted] weeks pregnant.

## 2015-12-21 LAB — CHLAMYDIA/NGC RT PCR (ARMC ONLY)

## 2016-01-11 ENCOUNTER — Other Ambulatory Visit: Payer: Self-pay | Admitting: Nurse Practitioner

## 2016-01-11 DIAGNOSIS — Z369 Encounter for antenatal screening, unspecified: Secondary | ICD-10-CM

## 2016-01-31 ENCOUNTER — Ambulatory Visit
Admission: RE | Admit: 2016-01-31 | Discharge: 2016-01-31 | Disposition: A | Discharge status: Home / Self Care | Payer: Medicaid Other | Source: Ambulatory Visit | Attending: Obstetrics and Gynecology | Admitting: Obstetrics and Gynecology

## 2016-01-31 ENCOUNTER — Encounter: Payer: Self-pay | Admitting: *Deleted

## 2016-01-31 ENCOUNTER — Other Ambulatory Visit: Payer: Self-pay | Admitting: *Deleted

## 2016-01-31 VITALS — BP 130/74 | HR 95 | Temp 98.7°F | Wt 139.0 lb

## 2016-01-31 DIAGNOSIS — Z369 Encounter for antenatal screening, unspecified: Secondary | ICD-10-CM

## 2016-01-31 DIAGNOSIS — Z3A13 13 weeks gestation of pregnancy: Secondary | ICD-10-CM | POA: Diagnosis not present

## 2016-01-31 DIAGNOSIS — Z3689 Encounter for other specified antenatal screening: Secondary | ICD-10-CM | POA: Insufficient documentation

## 2016-01-31 DIAGNOSIS — Z98891 History of uterine scar from previous surgery: Secondary | ICD-10-CM | POA: Diagnosis not present

## 2016-01-31 DIAGNOSIS — O42919 Preterm premature rupture of membranes, unspecified as to length of time between rupture and onset of labor, unspecified trimester: Secondary | ICD-10-CM

## 2016-01-31 HISTORY — DX: Other specified health status: Z78.9

## 2016-01-31 NOTE — Progress Notes (Signed)
[facility] Consultation   Chief Complaint: h/o perinatal loss   HPI: [name] is a 21 y.o. [phone]  Here with parnter Maria Richmond at 225w4d who presents in consultation from [facility]   for h/o preterm loss in last pregnancy . Pt says she was noted to have low fluid last pregnancy and was eventually found to have ruptured her membranes  " 21 yo G1P0 admitted at 61106w5d after PPROM.  She was started on latency antibiotics.  Received ICN consult.  She had previously received BMZ and did not received rescue course on admission.  On HD#4 she began having vaginal bleeding.  She was placed on fetal heart rate monitor to ensure fetal well-being.  She continued to have bleeding and new onset abdominal pain.  Placental abruption was suspected and she underwent urgent Classical cesarean section.  See procedure note for details.    Findings at time of delivery: A female infant in breech presentation.  Amniotic fluid - Clear.  Birth weight 870 g.  Apgars of 6 and 8. Arterial pH = 7.31, Venous pH = 7.32.  Placenta with a three-vessel cord, approximately 30% abruption with evidence of old clots in the uterus. Grossly normal uterus, tubes and ovaries bilaterally. Postpartum course complicated by neonatal demise.  Patient was taken to ICN at the time of coding and elected coding to stop and rather, to hold her baby"   Past Medical History: Patient  has a past medical history of Medical history non-contributory.  Past Surgical History: She  has a past surgical history that includes Cesarean section.  Obstetric History:  P0110  26 week loss at /[facility] 08/2014 classical cesarean  15 week loss ? Gynecologic History:  No LMP recorded (lmp unknown). Patient is pregnant.   Medications vitamins refused flu shot  Allergies: Patient has No Known Allergies.  Social History: Patient  reports that she has been smoking Cigarettes.  She has been smoking about 1.00 pack per day. She has never used smokeless  tobacco. She reports that she does not drink alcohol or use drugs.  Family History: family history is not on file.  Review of Systems A full 12 point review of systems was negative or as noted in the History of Present Illness.  Physical Exam: BP 130/74   Pulse 95   Temp 98.7 F (37.1 C)   Wt 139 lb (63 kg)   LMP  (LMP Unknown)   BMI 25.42 kg/m  U/s today   Asessement: 1. First trimester screening   2. Premature baby   3. Preterm premature rupture of membranes (PPROM) with unknown onset of labor   4 h/o classical cesarean 5 pt reports a h/o 15 week loss though no identifiable parts ?    Plan: Start baby aspirin  Request 17 P injections via [facility]  RTC in 3 weeks for cervical length though history is not typical for incompetent cervix  Consider APS work up   repeat cesarean at 37 weeks given h/o classical incision  H/o GC in 2017 repeat culture in third trimester     Total time spent with the patient was 30 minutes with greater than 50% spent in counseling and coordination of care. We appreciate this interesting consult and will be happy to be involved in the ongoing care of [name] in anyway her obstetricians desire.  Jimmey RalphLivingston, Devine Klingel, MD Maternal-Fetal Medicine [facility]

## 2016-01-31 NOTE — Progress Notes (Addendum)
Department, [place] [facility] Length of Consultation: 40 minutes   [name]  was referred to [facility] for genetic counseling to review prenatal screening and testing options.  This note summarizes the information we discussed.  [name] also had an MFM consultation and ultrasound, which are under separate cover.  We offered the following routine screening tests for this pregnancy:  First trimester screening, which includes nuchal translucency ultrasound screen and first trimester maternal serum marker screening.  The nuchal translucency has approximately an 80% detection rate for Down syndrome and can be positive for other chromosome abnormalities as well as congenital heart defects.  When combined with a maternal serum marker screening, the detection rate is up to 90% for Down syndrome and up to 97% for trisomy 18.     Maternal serum marker screening, a blood test that measures pregnancy proteins, can provide risk assessments for Down syndrome, trisomy 18, and open neural tube defects (spina bifida, anencephaly). Because it does not directly examine the fetus, it cannot positively diagnose or rule out these problems.  Targeted ultrasound uses high frequency sound waves to create an image of the developing fetus.  An ultrasound is often recommended as a routine means of evaluating the pregnancy.  It is also used to screen for fetal anatomy problems (for example, a heart defect) that might be suggestive of a chromosomal or other abnormality.   Should these screening tests indicate an increased concern, then the following additional testing options would be offered:  The chorionic villus sampling procedure is available for first trimester chromosome analysis.  This involves the withdrawal of a small amount of chorionic villi (tissue from the developing placenta).  Risk of pregnancy loss is estimated to be approximately 1 in 200 to 1 in 100 (0.5 to 1%).  There is approximately a 1% (1  in 100) chance that the CVS chromosome results will be unclear.  Chorionic villi cannot be tested for neural tube defects.     Amniocentesis involves the removal of a small amount of amniotic fluid from the sac surrounding the fetus with the use of a thin needle inserted through the maternal abdomen and uterus.  Ultrasound guidance is used throughout the procedure.  Fetal cells from amniotic fluid are directly evaluated and > 99.5% of chromosome problems and > 98% of open neural tube defects can be detected. This procedure is generally performed after the 15th week of pregnancy.  The main risks to this procedure include complications leading to miscarriage in less than 1 in 200 cases (0.5%).  As another option for information if the pregnancy is suspected to be an an increased chance for certain chromosome conditions, we also reviewed the availability of cell free fetal DNA testing from maternal blood to determine whether or not the baby may have either Down syndrome, trisomy 42, or trisomy 69.  This test utilizes a maternal blood sample and DNA sequencing technology to isolate circulating cell free fetal DNA from maternal plasma.  The fetal DNA can then be analyzed for DNA sequences that are derived from the three most common chromosomes involved in aneuploidy, chromosomes 13, 18, and 21.  If the overall amount of DNA is greater than the expected level for any of these chromosomes, aneuploidy is suspected.  While we do not consider it a replacement for invasive testing and karyotype analysis, a negative result from this testing would be reassuring, though not a guarantee of a normal chromosome complement for the baby.  An abnormal result  is certainly suggestive of an abnormal chromosome complement, though we would still recommend CVS or amniocentesis to confirm any findings from this testing.  Cystic Fibrosis and Spinal Muscular Atrophy (SMA) screening were also discussed with the patient. Both conditions are  recessive, which means that both parents must be carriers in order to have a child with the disease.  Cystic fibrosis (CF) is one of the most common genetic conditions in persons of Caucasian ancestry.  This condition occurs in approximately 1 in 2,500 Caucasian persons and results in thickened secretions in the lungs, digestive, and reproductive systems.  For a baby to be at risk for having CF, both of the parents must be carriers for this condition.  Approximately 1 in 64 Caucasian persons is a carrier for CF.  Current carrier testing looks for the most common mutations in the gene for CF and can detect approximately 90% of carriers in the Caucasian population.  This means that the carrier screening can greatly reduce, but cannot eliminate, the chance for an individual to have a child with CF.  If an individual is found to be a carrier for CF, then carrier testing would be available for the partner. As part of [place]'s newborn screening profile, all babies born in the state of [place] will have a two-tier screening process.  Specimens are first tested to determine the concentration of immunoreactive trypsinogen (IRT).  The top 5% of specimens with the highest IRT values then undergo DNA testing using a panel of over 40 common CF mutations. SMA is a neurodegenerative disorder that leads to atrophy of skeletal muscle and overall weakness.  This condition is also more prevalent in the Caucasian population, with 1 in 40-1 in 60 persons being a carrier and 1 in 6,000-1 in 10,000 children being affected.  There are multiple forms of the disease, with some causing death in infancy to other forms with survival into adulthood.  The genetics of SMA is complex, but carrier screening can detect up to 95% of carriers in the Caucasian population.  Similar to CF, a negative result can greatly reduce, but cannot eliminate, the chance to have a child with SMA.  We obtained a detailed family history and pregnancy  history.  This is the third pregnancy for [name].  Her first pregnancy was complicated by oligohydramnios and ruptured membranes which resulted in a csection at 26 weeks.  The baby passed away due to pulmonary hypoplasia.  She was followed at [facility] during the latter part of that pregnancy.  Since that time, she reports a miscarriage at approximately 15 weeks.  In the current pregnancy, there have been no complications.  See Dr. Vivien Rossetti note regarding surveillance in this pregnancy given her history.  The father of the baby is reported to have a maternal half sister with sickle cell disease.  He would have a 1/2 chance for being a carrier for sickle cell disease, which means that this pregnancy is at 1/4 chance for being a carrier.  [name] had hemoglobinopathy screening at [facility], which is reported to be negative in her records.  Her MCV is also normal (91).   Because her testing is negative, we do not expect this pregnancy to be at risk for a clinically significant hemoglobinopathy.  The remainder of the family history was reported to be unremarkable for birth defects, mental retardation, recurrent pregnancy loss or known chromosome abnormalities.  In this pregnancy, [name] had both alcohol and marijuana before she learned that she  was pregnant.  We encouraged her to continue to avoid both of these during the pregnancy, as marijuana is known to be associated with low birth weight, preterm delivery and poor pregnancy outcomes.  Alcohol consumption during pregnancy has been associated with a number of birth defects including growth delays, small head size, heart defects, eye anomalies and facial differences as well as learning disabilities and behavioral problems.  The risk of these to occur tends to increase with the amount of alcohol consumed, however, malformations have been seen with as little as two drinks per day.  Because there is no safe amount of alcohol consumption during pregnancy, we suggest  women completely avoid alcohol while pregnant.    After consideration of the options, [name] elected to proceed with first trimester screening and an ultrasound.  She declined CF and SMA carrier testing.  However, she left without having her blood drawn.  We have contacted her and she plans to come by on Monday, 02/04/2016 for the biochemical portion of the screening to be drawn.  An ultrasound was performed at the time of the visit.  The gestational age was consistent with 13 weeks.  Fetal anatomy could not be assessed due to early gestational age.  Please refer to the ultrasound report for details of that study.  [name] was encouraged to call with questions or concerns.  We can be contacted at [phone].   Cherly Andersoneborah F. Jett Kulzer, MS, CGC

## 2016-02-04 ENCOUNTER — Ambulatory Visit
Admission: RE | Admit: 2016-02-04 | Discharge: 2016-02-04 | Disposition: A | Discharge status: Home / Self Care | Payer: Medicaid Other | Source: Ambulatory Visit | Attending: Obstetrics and Gynecology | Admitting: Obstetrics and Gynecology

## 2016-02-07 ENCOUNTER — Telehealth: Payer: Self-pay | Admitting: Obstetrics and Gynecology

## 2016-02-07 NOTE — Telephone Encounter (Signed)
The results of the First Trimester Nuchal Translucency and Biochemical Screening are now available.  These results showed an increased risk for Down Syndrome.  Specifically, the risk for Down syndrome is now 1 in 79190 (the prior risk based upon age alone was 1 in 1,108).  The risk for Trisomy 13/18 is estimated to be less than 1 in 10,000.  As we discussed when we called with these results, if more definitive information is desired, we would offer chorionic villus sampling or amniocentesis.  Because we do not yet know the effectiveness of combined first and second trimester screening, we do not recommend a maternal serum screen to assess the chance for chromosome conditions.  However, if screening for neural tube defects is desired, maternal serum screening for AFP only can be performed between 15 and [redacted] weeks gestation.    We also reviewed the availability of cell free fetal DNA testing from maternal blood to determine whether or not the baby may have either Down syndrome, trisomy 6213, or trisomy 5418.  This test utilizes a maternal blood sample and DNA sequencing technology to isolate circulating cell free fetal DNA from maternal plasma.  The fetal DNA can then be analyzed for DNA sequences that are derived from the three most common chromosomes involved in aneuploidy, chromosomes 13, 18, and 21.  If the overall amount of DNA is greater than the expected level for any of these chromosomes, aneuploidy is suspected.  While we do not consider it a replacement for invasive testing and karyotype analysis, a negative result from this testing would be reassuring, though not a guarantee of a normal chromosome complement for the baby.  An abnormal result is certainly suggestive of an abnormal chromosome complement, though we would still recommend CVS or amniocentesis to confirm any findings from this testing  Of note, the PAPP-A level was at the 2.5 percentile.  PAPP-A results at or below the 5th percentile have been  associated with and increased chance for adverse obstetrical outcomes, including preeclampsia, fetal loss and preterm delivery.  For this reason, a third trimester ultrasound for growth could be considered.  Given Ms. Gillison's prior pregnancy history, increased surveillance is already planned.  The patient elected to return to clinic on 02/11/2016 for cell free DNA testing.  The patient was encouraged to contact us with any questions.  We may be reached at [phone].   Cherly Andersoneborah F. Shandreka Dante, MS, CGC

## 2016-02-14 ENCOUNTER — Encounter: Payer: Self-pay | Admitting: Emergency Medicine

## 2016-02-14 ENCOUNTER — Emergency Department
Admission: EM | Admit: 2016-02-14 | Discharge: 2016-02-14 | Disposition: A | Discharge status: Home / Self Care | Payer: Medicaid Other | Attending: Emergency Medicine | Admitting: Emergency Medicine

## 2016-02-14 DIAGNOSIS — O219 Vomiting of pregnancy, unspecified: Secondary | ICD-10-CM | POA: Diagnosis not present

## 2016-02-14 DIAGNOSIS — R112 Nausea with vomiting, unspecified: Secondary | ICD-10-CM

## 2016-02-14 DIAGNOSIS — Z79899 Other long term (current) drug therapy: Secondary | ICD-10-CM | POA: Diagnosis not present

## 2016-02-14 DIAGNOSIS — O99332 Smoking (tobacco) complicating pregnancy, second trimester: Secondary | ICD-10-CM | POA: Diagnosis not present

## 2016-02-14 DIAGNOSIS — F17210 Nicotine dependence, cigarettes, uncomplicated: Secondary | ICD-10-CM | POA: Diagnosis not present

## 2016-02-14 DIAGNOSIS — R1013 Epigastric pain: Secondary | ICD-10-CM | POA: Diagnosis not present

## 2016-02-14 DIAGNOSIS — Z3A15 15 weeks gestation of pregnancy: Secondary | ICD-10-CM | POA: Diagnosis not present

## 2016-02-14 DIAGNOSIS — Z791 Long term (current) use of non-steroidal anti-inflammatories (NSAID): Secondary | ICD-10-CM | POA: Diagnosis not present

## 2016-02-14 LAB — COMPREHENSIVE METABOLIC PANEL
ALBUMIN: 3.9 g/dL (ref 3.5–5.0)
ALT: 12 U/L — AB (ref 14–54)
AST: 18 U/L (ref 15–41)
Alkaline Phosphatase: 43 U/L (ref 38–126)
Anion gap: 6 (ref 5–15)
BILIRUBIN TOTAL: 0.5 mg/dL (ref 0.3–1.2)
BUN: 8 mg/dL (ref 6–20)
CO2: 26 mmol/L (ref 22–32)
CREATININE: 0.57 mg/dL (ref 0.44–1.00)
Calcium: 9.5 mg/dL (ref 8.9–10.3)
Chloride: 104 mmol/L (ref 101–111)
GFR calc Af Amer: >60 mL/min (ref >60)
GLUCOSE: 92 mg/dL (ref 65–99)
Potassium: 3.8 mmol/L (ref 3.5–5.1)
Sodium: 136 mmol/L (ref 135–145)
TOTAL PROTEIN: 7.8 g/dL (ref 6.5–8.1)

## 2016-02-14 LAB — CBC
HEMATOCRIT: 39.1 % (ref 35.0–47.0)
HEMOGLOBIN: 13.6 g/dL (ref 12.0–16.0)
MCH: 30.3 pg (ref 26.0–34.0)
MCHC: 34.9 g/dL (ref 32.0–36.0)
MCV: 87 fL (ref 80.0–100.0)
Platelets: 224 10*3/uL (ref 150–440)
RBC: 4.5 MIL/uL (ref 3.80–5.20)
RDW: 12.4 % (ref 11.5–14.5)
WBC: 6 10*3/uL (ref 3.6–11.0)

## 2016-02-14 LAB — LIPASE, BLOOD: Lipase: 22 U/L (ref 11–51)

## 2016-02-14 LAB — URINALYSIS, COMPLETE (UACMP) WITH MICROSCOPIC
BACTERIA UA: NONE SEEN
Bilirubin Urine: NEGATIVE
Glucose, UA: NEGATIVE mg/dL
Hgb urine dipstick: NEGATIVE
KETONES UR: 5 mg/dL — AB
Nitrite: NEGATIVE
PROTEIN: NEGATIVE mg/dL
Specific Gravity, Urine: 1.023 (ref 1.005–1.030)
pH: 6 (ref 5.0–8.0)

## 2016-02-14 LAB — INFLUENZA PANEL BY PCR (TYPE A & B)
INFLBPCR: NEGATIVE
Influenza A By PCR: NEGATIVE

## 2016-02-14 MED ORDER — SODIUM CHLORIDE 0.9 % IV BOLUS (SEPSIS)
1000.0000 mL | Freq: Once | INTRAVENOUS | Status: AC
  Administered 2016-02-14: 1000 mL via INTRAVENOUS

## 2016-02-14 MED ORDER — ONDANSETRON HCL 4 MG/2ML IJ SOLN
INTRAMUSCULAR | Status: AC
  Administered 2016-02-14: 4 mg
  Filled 2016-02-14: qty 2

## 2016-02-14 MED ORDER — ONDANSETRON 4 MG PO TBDP: 4.0000 mg | ORAL_TABLET | Freq: Three times a day (TID) | ORAL | 0 refills | Status: DC | PRN

## 2016-02-14 MED ORDER — ONDANSETRON HCL 4 MG/2ML IJ SOLN
INTRAMUSCULAR | Status: AC
  Filled 2016-02-14: qty 2

## 2016-02-14 NOTE — ED Triage Notes (Signed)
Pt reports abdominal pain and vomiting that began yesterday. Pt denies diarrhea. Pt states she is [redacted] weeks pregnant.

## 2016-02-14 NOTE — Discharge Instructions (Signed)
Please take a clear liquid diet for the next 24 hours, then advance to a bland diet as tolerated. You may take Zofran for nausea.  Make an [facility] for [facility]-up.  Return to the emergency department if you develop inability to keep down fluids, abdominal pain, fever, diarrhea, shortness of breath, vaginal bleeding, or any other symptoms concerning to you.

## 2016-02-14 NOTE — ED Notes (Signed)
Attempted twice to locate fetal heart tones without success. MD made aware and ultrasound at bedside.

## 2016-02-14 NOTE — ED Provider Notes (Signed)
[facility] Emergency Department Provider Note  ____________________________________________  Time seen: Approximately 11:47 AM  I have reviewed the triage vital signs and the nursing notes.   HISTORY  Chief Complaint Emesis and Abdominal Pain    HPI Maria Richmond is a 21 y.o. female G3 P2 approximately 15 weeks presenting with nausea and vomiting. The patient reports that since yesterday while she was at work, she has had multiple episodes of nausea and vomiting and is unable to keep anything down. After the vomiting began, she developed some epigastric discomfort. She has not had any diarrhea or constipation, fever or chills. She has had some mild cough without congestion or rhinorrhea, sore throat or ear pain. No vaginal bleeding, change in vaginal discharge. No dysuria.  No known sick contacts. Did not have flu vaccine this year.   Past Medical History:  Diagnosis Date  . Medical history non-contributory     Patient Active Problem List   Diagnosis Date Noted  . History of classical cesarean section 01/31/2016  . First trimester screening   . Preterm premature rupture of membranes (PPROM) with unknown onset of labor 07/17/2014    Past Surgical History:  Procedure Laterality Date  . CESAREAN SECTION      Current Outpatient Rx  . Order #: [id number] Class: Print  . Order #: [id number] Class: Print  . Order #: [id number] Class: Print  . Order #: [id number] Class: Print  . Order #: [id number] Class: Historical Med    Allergies Patient has no known allergies.  No family history on file.  Social History Social History  Substance Use Topics  . Smoking status: Current Every Day Smoker    Packs/day: 1.00    Types: Cigarettes  . Smokeless tobacco: Never Used  . Alcohol use No    Review of Systems Constitutional: No fever/chills.No lightheadedness or syncope. Eyes: No visual changes. ENT: No sore throat. No congestion or rhinorrhea. Cardiovascular:  Denies chest pain. Denies palpitations. Respiratory: Denies shortness of breath.  Positive dry cough. Gastrointestinal: Positive epigastric abdominal pain.  Positive nausea, positive vomiting.  No diarrhea.  No constipation. Genitourinary: Negative for dysuria. No change in vaginal discharge or gush of fluid. No vaginal bleeding. Musculoskeletal: Negative for back pain. Skin: Negative for rash. Neurological: Negative for headaches. No focal numbness, tingling or weakness.   10-point ROS otherwise negative.  ____________________________________________   PHYSICAL EXAM:  VITAL SIGNS: ED Triage Vitals [02/14/16 0958]  Enc Vitals Group     BP 126/81     Pulse Rate 88     Resp 18     Temp 98.4 F (36.9 C)     Temp Source Oral     SpO2 100 %     Weight 143 lb (64.9 kg)     Height 5\' 2"  (1.575 m)     Head Circumference      Peak Flow      Pain Score 7     Pain Loc      Pain Edu?      Excl. in GC?     Constitutional: Alert and oriented. Well appearing and in no acute distress. Answers questions appropriately. Eyes: Conjunctivae are normal.  EOMI. No scleral icterus. Head: Atraumatic. Nose: No congestion/rhinnorhea. Mouth/Throat: Mucous membranes are moist.  Neck: No stridor.  Supple.   Cardiovascular: Normal rate, regular rhythm. No murmurs, rubs or gallops.  Respiratory: Normal respiratory effort.  No accessory muscle use or retractions. Lungs CTAB.  No wheezes, rales or ronchi. Gastrointestinal: Soft, nontender and nondistended.No reproducible  tenderness on my examination. The patient has a palpable fundus approximately 5 or 6 on a meters below the umbilicus.  No guarding or rebound.  No peritoneal signs. GU: Deferred as the patient is not having any vaginal bleeding, change in vaginal discharge or other vaginal complaints. Musculoskeletal: No LE edema. No ttp in the calves or palpable cords.  Negative Homan's sign. Neurologic:  A&Ox3.  Speech is clear.  Face and smile are  symmetric.  EOMI.  Moves all extremities well. Skin:  Skin is warm, dry and intact. No rash noted. Psychiatric: Mood and affect are normal. Speech and behavior are normal.  Normal judgement.  ____________________________________________   LABS (all labs ordered are listed, but only abnormal results are displayed)  Labs Reviewed  COMPREHENSIVE METABOLIC PANEL - Abnormal; Notable for the following:       Result Value   ALT 12 (*)    All other components within normal limits  URINALYSIS, COMPLETE (UACMP) WITH MICROSCOPIC - Abnormal; Notable for the following:    Color, Urine YELLOW (*)    APPearance HAZY (*)    Ketones, ur 5 (*)    Leukocytes, UA TRACE (*)    Squamous Epithelial / LPF 6-30 (*)    All other components within normal limits  LIPASE, BLOOD  CBC  INFLUENZA PANEL BY PCR (TYPE A & B)   ____________________________________________  EKG  Not indicated ____________________________________________  RADIOLOGY  No results found.  ____________________________________________   PROCEDURES  Procedure(s) performed: None  Procedures  Critical Care performed: No ____________________________________________   INITIAL IMPRESSION / ASSESSMENT AND PLAN / ED COURSE  Pertinent labs & imaging results that were available during my care of the patient were reviewed by me and considered in my medical decision making (see chart for details).  21 y.o. female approximately [redacted] weeks pregnant presenting with 2 days of nausea and vomiting with mild epigastric discomfort. The patient also has some mild cough. We will get an influenza test, she may have a flu or flulike illness. She has a benign abdomen on examination and has not had any vaginal complaints, so an acute intra-abdominal or GU process or infection is much less likely and no imaging is warranted at this time. Will get fetal heart tones, and treat the patient symptomatically. At this time, the patient's auditory studies are  reassuring with normal electrolytes, normal lipase, normal white blood cell count, and a urinalysis which does not show any evidence of UTI. If the patient is feeling better and we are able to control her symptoms, I'll plan to discharge her home with close OB/GYN follow-up.  ----------------------------------------- 1:17 PM on 02/14/2016 -----------------------------------------  The patient is feeling much better and able to tolerate liquid. Ultrasound was performed at the bedside with normal fetal movement, and fetal heart tones around 130.  The patient's influenza testing is negative, and her blood work is reassuring. She continues to have stable vital signs and has not had any further episodes of vomiting in the ER. Plan discharge. Return precautions as well as follow-up instructions were discussed. ____________________________________________  FINAL CLINICAL IMPRESSION(S) / ED DIAGNOSES  Final diagnoses:  Nausea and vomiting, intractability of vomiting not specified, unspecified vomiting type  Epigastric pain         NEW MEDICATIONS STARTED DURING THIS VISIT:  New Prescriptions   ONDANSETRON (ZOFRAN ODT) 4 MG DISINTEGRATING TABLET    Take 1 tablet (4 mg total) by mouth every 8 (eight) hours as needed for nausea or vomiting.  Rockne MenghiniAnne-Caroline Harleyquinn Gasser, MD 02/14/16 1329

## 2016-02-14 NOTE — ED Notes (Signed)
Pt drinking gingerale w/o issue

## 2016-02-18 ENCOUNTER — Other Ambulatory Visit: Payer: Self-pay | Admitting: *Deleted

## 2016-02-18 DIAGNOSIS — Z8751 Personal history of pre-term labor: Secondary | ICD-10-CM

## 2016-02-21 ENCOUNTER — Ambulatory Visit
Admission: RE | Admit: 2016-02-21 | Discharge: 2016-02-21 | Disposition: A | Discharge status: Home / Self Care | Payer: Medicaid Other | Source: Ambulatory Visit | Attending: Obstetrics and Gynecology | Admitting: Obstetrics and Gynecology

## 2016-02-21 ENCOUNTER — Other Ambulatory Visit: Payer: Self-pay | Admitting: *Deleted

## 2016-02-21 VITALS — BP 119/74 | HR 92 | Temp 99.6°F | Wt 143.6 lb

## 2016-02-21 DIAGNOSIS — Z87891 Personal history of nicotine dependence: Secondary | ICD-10-CM | POA: Insufficient documentation

## 2016-02-21 DIAGNOSIS — Z3A16 16 weeks gestation of pregnancy: Secondary | ICD-10-CM | POA: Insufficient documentation

## 2016-02-21 DIAGNOSIS — O42919 Preterm premature rupture of membranes, unspecified as to length of time between rupture and onset of labor, unspecified trimester: Secondary | ICD-10-CM | POA: Diagnosis present

## 2016-02-21 DIAGNOSIS — Z369 Encounter for antenatal screening, unspecified: Secondary | ICD-10-CM

## 2016-02-21 DIAGNOSIS — Z8751 Personal history of pre-term labor: Secondary | ICD-10-CM

## 2016-02-27 ENCOUNTER — Encounter: Payer: Self-pay | Admitting: Emergency Medicine

## 2016-02-27 ENCOUNTER — Emergency Department
Admission: EM | Admit: 2016-02-27 | Discharge: 2016-02-27 | Disposition: A | Discharge status: Home / Self Care | Payer: Medicaid Other | Attending: Emergency Medicine | Admitting: Emergency Medicine

## 2016-02-27 DIAGNOSIS — Z79899 Other long term (current) drug therapy: Secondary | ICD-10-CM | POA: Diagnosis not present

## 2016-02-27 DIAGNOSIS — Z3A16 16 weeks gestation of pregnancy: Secondary | ICD-10-CM | POA: Diagnosis not present

## 2016-02-27 DIAGNOSIS — N760 Acute vaginitis: Secondary | ICD-10-CM

## 2016-02-27 DIAGNOSIS — O200 Threatened abortion: Secondary | ICD-10-CM | POA: Diagnosis not present

## 2016-02-27 DIAGNOSIS — O209 Hemorrhage in early pregnancy, unspecified: Secondary | ICD-10-CM | POA: Diagnosis present

## 2016-02-27 DIAGNOSIS — O23592 Infection of other part of genital tract in pregnancy, second trimester: Secondary | ICD-10-CM | POA: Insufficient documentation

## 2016-02-27 DIAGNOSIS — O4690 Antepartum hemorrhage, unspecified, unspecified trimester: Secondary | ICD-10-CM

## 2016-02-27 DIAGNOSIS — Z87891 Personal history of nicotine dependence: Secondary | ICD-10-CM | POA: Diagnosis not present

## 2016-02-27 DIAGNOSIS — B9689 Other specified bacterial agents as the cause of diseases classified elsewhere: Secondary | ICD-10-CM

## 2016-02-27 LAB — INFORMASEQ(SM) WITH XY ANALYSIS
FETAL NUMBER: 1
Fetal Fraction (%):: 9.8
Gestational Age at Collection: 16.1 weeks
WEIGHT: 143 [lb_av]

## 2016-02-27 LAB — WET PREP, GENITAL
SPERM: NONE SEEN
TRICH WET PREP: NONE SEEN
YEAST WET PREP: NONE SEEN

## 2016-02-27 LAB — CHLAMYDIA/NGC RT PCR (ARMC ONLY)
Chlamydia Tr: NOT DETECTED
N gonorrhoeae: NOT DETECTED

## 2016-02-27 LAB — URINALYSIS, COMPLETE (UACMP) WITH MICROSCOPIC
Bacteria, UA: NONE SEEN
Bilirubin Urine: NEGATIVE
GLUCOSE, UA: NEGATIVE mg/dL
Hgb urine dipstick: NEGATIVE
Ketones, ur: 20 mg/dL — AB
Leukocytes, UA: NEGATIVE
Nitrite: NEGATIVE
PROTEIN: NEGATIVE mg/dL
Specific Gravity, Urine: 1.021 (ref 1.005–1.030)
pH: 6 (ref 5.0–8.0)

## 2016-02-27 LAB — HCG, QUANTITATIVE, PREGNANCY: hCG, Beta Chain, Quant, S: 53061 m[IU]/mL — ABNORMAL HIGH (ref <5)

## 2016-02-27 LAB — ABO/RH: ABO/RH(D): O POS

## 2016-02-27 MED ORDER — METRONIDAZOLE 500 MG PO TABS: 500.0000 mg | ORAL_TABLET | Freq: Two times a day (BID) | ORAL | 0 refills | Status: DC

## 2016-02-27 MED ORDER — METRONIDAZOLE 500 MG PO TABS
500.0000 mg | ORAL_TABLET | Freq: Once | ORAL | Status: AC
  Administered 2016-02-27: 500 mg via ORAL

## 2016-02-27 MED ORDER — METRONIDAZOLE 500 MG PO TABS
ORAL_TABLET | ORAL | Status: AC
  Filled 2016-02-27: qty 1

## 2016-02-27 NOTE — ED Triage Notes (Signed)
Pt with abd cramping last night and some spotting today, light in nature. Pt is 16 weeks. Pregnant.

## 2016-02-27 NOTE — ED Provider Notes (Signed)
[facility] Emergency Department Provider Note  ____________________________________________   First MD Initiated Contact with Patient 02/27/16 1516     (approximate)  I have reviewed the triage vital signs and the nursing notes.   HISTORY  Chief Complaint Abdominal Cramping and Vaginal Bleeding   HPI Davin Sharon SellerLloyd is a 21 y.o. female who is about [redacted] weeks gestation who is presented to the emergency department today with a small amount of bleeding that occurred in the shower this morning. She says that she is also having a mild amount of left lower quadrant abdominal pain also with nausea vomiting and diarrhea this morning. Does not report any blood in the vomit or diarrhea. History of preterm labor. Is also getting hormone injections to prevent repeat preterm labor. Currently taking prenatal vitamins. When she had a similar episode previously she was diagnosed with bacterial vaginosis.   Past Medical History:  Diagnosis Date  . Medical history non-contributory     Patient Active Problem List   Diagnosis Date Noted  . Quit smoking within past year 02/21/2016  . History of classical cesarean section 01/31/2016  . First trimester screening elevated DSR    . Preterm premature rupture of membranes (PPROM) with unknown onset of labor 07/17/2014    Past Surgical History:  Procedure Laterality Date  . CESAREAN SECTION      Prior to Admission medications   Medication Sig Start Date End Date Taking? Authorizing Provider  ondansetron (ZOFRAN ODT) 4 MG disintegrating tablet Take 1 tablet (4 mg total) by mouth every 8 (eight) hours as needed for nausea or vomiting. 02/14/16   Rockne MenghiniAnne-Caroline Norman, MD  Prenatal Vit-Fe Fumarate-FA (PRENATAL MULTIVITAMIN) TABS tablet Take 1 tablet by mouth daily at 12 noon.    Historical Provider, MD    Allergies Patient has no known allergies.  No family history on file.  Social History Social History  Substance Use  Topics  . Smoking status: Former Smoker    Packs/day: 1.00    Types: Cigarettes  . Smokeless tobacco: Never Used  . Alcohol use No    Review of Systems Constitutional: No fever/chills Eyes: No visual changes. ENT: No sore throat. Cardiovascular: Denies chest pain. Respiratory: Denies shortness of breath. Gastrointestinal:  No constipation. Genitourinary: Negative for dysuria. Musculoskeletal: Negative for back pain. Skin: Negative for rash. Neurological: Negative for headaches, focal weakness or numbness.  10-point ROS otherwise negative.  ____________________________________________   PHYSICAL EXAM:  VITAL SIGNS: ED Triage Vitals  Enc Vitals Group     BP 02/27/16 1424 108/68     Pulse Rate 02/27/16 1424 92     Resp 02/27/16 1424 18     Temp 02/27/16 1424 98.8 F (37.1 C)     Temp Source 02/27/16 1424 Oral     SpO2 02/27/16 1424 99 %     Weight 02/27/16 1425 143 lb (64.9 kg)     Height --      Head Circumference --      Peak Flow --      Pain Score 02/27/16 1425 7     Pain Loc --      Pain Edu? --      Excl. in GC? --     Constitutional: Alert and oriented. Well appearing and in no acute distress. Eyes: Conjunctivae are normal. PERRL. EOMI. Head: Atraumatic. Nose: No congestion/rhinnorhea. Mouth/Throat: Mucous membranes are moist.   Neck: No stridor.   Cardiovascular: Normal rate, regular rhythm. Grossly normal heart sounds.   Respiratory:  Normal respiratory effort.  No retractions. Lungs CTAB. Gastrointestinal: Soft and nontender. No distention. Genitourinary: Normal external exams. Speculum exam a small amount of white discharge. Bimanual exam without cervical motion tenderness palpation. Mild suprapubic as well as medial left adnexal tenderness palpation without any masses palpated. No right adnexal tenderness nor masses palpable. Musculoskeletal: No lower extremity tenderness nor edema.   Neurologic:  Normal speech and language. No gross focal neurologic  deficits are appreciated.  Skin:  Skin is warm, dry and intact. No rash noted. Psychiatric: Mood and affect are normal. Speech and behavior are normal.  ____________________________________________   LABS (all labs ordered are listed, but only abnormal results are displayed)  Labs Reviewed  WET PREP, GENITAL - Abnormal; Notable for the following:       Result Value   Clue Cells Wet Prep HPF POC PRESENT (*)    WBC, Wet Prep HPF POC MODERATE (*)    All other components within normal limits  HCG, QUANTITATIVE, PREGNANCY - Abnormal; Notable for the following:    hCG, Beta Chain, Quant, S 53,061 (*)    All other components within normal limits  URINALYSIS, COMPLETE (UACMP) WITH MICROSCOPIC - Abnormal; Notable for the following:    Color, Urine YELLOW (*)    APPearance HAZY (*)    Ketones, ur 20 (*)    Squamous Epithelial / LPF 6-30 (*)    All other components within normal limits  CHLAMYDIA/NGC RT PCR ([facility] ONLY)  ABO/RH   ____________________________________________  EKG   ____________________________________________  RADIOLOGY   ____________________________________________   PROCEDURES  Procedure(s) performed:   Procedures  Critical Care performed:   ____________________________________________   INITIAL IMPRESSION / ASSESSMENT AND PLAN / ED COURSE  Pertinent labs & imaging results that were available during my care of the patient were reviewed by me and considered in my medical decision making (see chart for details).  ----------------------------------------- 5:48 PM on 02/27/2016 -----------------------------------------  Fetal heart tones in the 150s. Patient resting of the lid this time. No further episodes of nausea and vomiting. Says that she has not done any prenatal vitamins and will be following up with [facility] side OB[facility]. We will treat her for bacterial vaginosis. The patient understands not to do any lifting and also understands to practice  pelvic rest without any sex or anything in the vagina. We following up with her OB/GYN. Will be discharged home.      ____________________________________________   FINAL CLINICAL IMPRESSION(S) / ED DIAGNOSES  bacterial vaginosis. Threatened abortion.    NEW MEDICATIONS STARTED DURING THIS VISIT:  New Prescriptions   No medications on file     Note:  This document was prepared using Dragon voice recognition software and may include unintentional dictation errors.    Myrna Blazer, MD 02/27/16 [phone]

## 2016-02-27 NOTE — ED Notes (Signed)
Pt states lower abd cramping that began last night, states some vaginal bleeding in the shower this AM, at present states lower abd discomfort, states she is [redacted] weeks pregnant, awake and alert in no acute distress

## 2016-02-28 ENCOUNTER — Telehealth: Payer: Self-pay | Admitting: Obstetrics and Gynecology

## 2016-02-28 NOTE — Telephone Encounter (Signed)
[name] had cell free DNA testing [facility] on 02/21/2016 due to an abnormal first trimester screening result earlier in this pregnancy which showed a 1 in 190 chance for Down syndrome.  The patient was informed of the results of her recent InformaSeq testing (performed at [facility]) which yielded NEGATIVE results.  The patient's specimen showed DNA consistent with two copies of chromosomes 21, 18 and 13.  The sensitivity for trisomy 9321, trisomy 8918 and trisomy 7213 using this testing are reported as 99.1%, 98.3% and 98.1% respectively.  Thus, while the results of this testing are highly accurate, they are not considered diagnostic at this time.  Should more definitive information be desired, the patient may still consider amniocentesis.   As requested to know by the patient, sex chromosome analysis was included for this sample.  Results was consistent with a female (XY) fetus. This is predicted with >97% accuracy.  A maternal serum AFP only should be considered if screening for neural tube defects is desired.  We can be reached at [phone] with any questions.  Cherly Andersoneborah F. Neesa Knapik, MS, CGC

## 2016-03-06 ENCOUNTER — Other Ambulatory Visit: Payer: Self-pay | Admitting: *Deleted

## 2016-03-06 ENCOUNTER — Inpatient Hospital Stay: Admission: RE | Admit: 2016-03-06 | Payer: Medicaid Other | Source: Ambulatory Visit

## 2016-03-06 DIAGNOSIS — Z8751 Personal history of pre-term labor: Secondary | ICD-10-CM

## 2016-03-10 ENCOUNTER — Ambulatory Visit
Admission: RE | Admit: 2016-03-10 | Discharge: 2016-03-10 | Disposition: A | Discharge status: Home / Self Care | Payer: Medicaid Other | Source: Ambulatory Visit | Attending: Maternal & Fetal Medicine | Admitting: Maternal & Fetal Medicine

## 2016-03-10 DIAGNOSIS — Z3A18 18 weeks gestation of pregnancy: Secondary | ICD-10-CM | POA: Diagnosis not present

## 2016-03-10 DIAGNOSIS — Z8751 Personal history of pre-term labor: Secondary | ICD-10-CM | POA: Diagnosis not present

## 2016-03-20 ENCOUNTER — Other Ambulatory Visit: Payer: Self-pay | Admitting: *Deleted

## 2016-03-20 DIAGNOSIS — Z8751 Personal history of pre-term labor: Secondary | ICD-10-CM

## 2016-03-24 ENCOUNTER — Ambulatory Visit: Payer: Medicaid Other

## 2016-04-03 ENCOUNTER — Ambulatory Visit
Admission: RE | Admit: 2016-04-03 | Discharge: 2016-04-03 | Disposition: A | Discharge status: Home / Self Care | Payer: Medicaid Other | Source: Ambulatory Visit | Attending: Maternal & Fetal Medicine | Admitting: Maternal & Fetal Medicine

## 2016-04-03 DIAGNOSIS — Z8751 Personal history of pre-term labor: Secondary | ICD-10-CM | POA: Diagnosis present

## 2016-04-03 DIAGNOSIS — Z3A22 22 weeks gestation of pregnancy: Secondary | ICD-10-CM | POA: Diagnosis not present

## 2016-04-03 DIAGNOSIS — O42919 Preterm premature rupture of membranes, unspecified as to length of time between rupture and onset of labor, unspecified trimester: Secondary | ICD-10-CM

## 2016-04-04 ENCOUNTER — Observation Stay
Admission: EM | Admit: 2016-04-04 | Discharge: 2016-04-04 | Disposition: A | Discharge status: Home / Self Care | Payer: Medicaid Other | Attending: Obstetrics & Gynecology | Admitting: Obstetrics & Gynecology

## 2016-04-04 ENCOUNTER — Inpatient Hospital Stay: Admission: EM | Admit: 2016-04-04 | Payer: Self-pay | Admitting: Obstetrics & Gynecology

## 2016-04-04 DIAGNOSIS — O42912 Preterm premature rupture of membranes, unspecified as to length of time between rupture and onset of labor, second trimester: Secondary | ICD-10-CM

## 2016-04-04 DIAGNOSIS — R32 Unspecified urinary incontinence: Secondary | ICD-10-CM

## 2016-04-04 DIAGNOSIS — Z3A21 21 weeks gestation of pregnancy: Secondary | ICD-10-CM | POA: Insufficient documentation

## 2016-04-04 DIAGNOSIS — O26892 Other specified pregnancy related conditions, second trimester: Secondary | ICD-10-CM

## 2016-04-04 DIAGNOSIS — Z87891 Personal history of nicotine dependence: Secondary | ICD-10-CM | POA: Diagnosis not present

## 2016-04-04 DIAGNOSIS — O4290 Premature rupture of membranes, unspecified as to length of time between rupture and onset of labor, unspecified weeks of gestation: Secondary | ICD-10-CM | POA: Diagnosis present

## 2016-04-04 LAB — URINALYSIS, COMPLETE (UACMP) WITH MICROSCOPIC
BILIRUBIN URINE: NEGATIVE
Bacteria, UA: NONE SEEN
GLUCOSE, UA: NEGATIVE mg/dL
Hgb urine dipstick: NEGATIVE
KETONES UR: NEGATIVE mg/dL
LEUKOCYTES UA: NEGATIVE
Nitrite: NEGATIVE
PH: 7 (ref 5.0–8.0)
PROTEIN: NEGATIVE mg/dL
RBC / HPF: NONE SEEN RBC/hpf (ref 0–5)
Specific Gravity, Urine: 1.017 (ref 1.005–1.030)

## 2016-04-04 MED ORDER — ONDANSETRON HCL 4 MG/2ML IJ SOLN: 4.0000 mg | Freq: Four times a day (QID) | INTRAMUSCULAR | Status: DC | PRN

## 2016-04-04 MED ORDER — ACETAMINOPHEN 325 MG PO TABS: 650.0000 mg | ORAL_TABLET | ORAL | Status: DC | PRN

## 2016-04-04 NOTE — Discharge Instructions (Signed)
Premature Rupture and Preterm Premature Rupture of Membranes °A sac made up of membranes surrounds your baby in the womb (uterus). Rupture of membranes is when this sac breaks open. This is also known as your "water breaking." When this sac breaks before labor starts, it is called premature rupture of membranes (PROM). If this happens before 37 weeks of being pregnant, it is called preterm premature rupture of membranes (PPROM). PPROM is serious. It needs medical care right away. °What increases the risk of PPROM? °PPROM is more likely to happen in women who: °· Have an infection. °· Have had PPROM before. °· Have a cervix that is short. °· Have bleeding during the second or third trimester. °· Have a low BMI. This is a measure of body fat. °· Smoke. °· Use drugs. °· Have a low socioeconomic status. ° °What problems can be caused by PROM and PPROM? °This condition creates health dangers for the mother and the baby. These include: °· Giving birth to the baby too early (prematurely). °· Getting a serious infection of the placenta (chorioamnionitis). °· Having the placenta detach from the uterus early (placental abruption). °· Squeezing of the umbilical cord. °· Getting a serious infection after delivery. ° °What are the signs of PROM and PPROM? °· A sudden gush of fluid from the vagina. °· A slow leak of fluid from the vagina. °· Your underwear is wet. °What should I do if I think my water broke? °Call your doctor right away. You will need to go to the hospital to get checked right away. °What happens if I am told that I have PROM or PPROM? °You will have tests done at the hospital. °· If you have PROM, you may be given medicine to start labor (be induced). This may be done if you are not having contractions during the 24 hours after your water broke. °· If you have PPROM and are not having contractions, you may be given medicine to start labor. It will depend on how far along you are in your pregnancy. ° °If you have  PPROM: °· You and your baby will be watched closely to see if you have infections or other problems. °· You may be given: °? An antibiotic medicine. This can stop an infection from starting. °? A steroid medicine. This can help your baby's lungs develop faster. °? A medicine to help prevent cerebral palsy in your baby. °? A medicine to stop early labor (preterm labor). °· You may be told to stay in bed except to use the bathroom (bed rest). °· You may be given medicine to start labor. This may be done if there are problems with you or the baby. ° °Your treatment will depend on many factors. °Contact a doctor if: °· Your water breaks and you are not having contractions. °Get help right away if: °· Your water breaks before you are [redacted] weeks pregnant. °Summary °· When your water breaks before labor starts, it is called premature rupture of membranes (PROM). °· When PROM happens before 37 weeks of pregnancy, it is called preterm premature rupture of membranes (PPROM). °· If you are not having contractions, your labor may be started for you. °This information is not intended to replace advice given to you by your health care provider. Make sure you discuss any questions you have with your health care provider. °Document Released: 03/21/2008 Document Revised: 09/13/2015 Document Reviewed: 09/13/2015 °Elsevier Interactive Patient Education © 2017 [employer] ° °

## 2016-04-04 NOTE — Final Progress Note (Signed)
Physician Final Progress Note  Patient ID: Maria Richmond MRN: [id number] DOB/AGE: [birth year] 21 y.o.  Admit date: 04/04/2016 Admitting provider: Nadara Mustard, MD Discharge date: 04/04/2016  Admission Diagnoses: Leakage of luid  Discharge Diagnoses:  Active Problems:   Leakage of amniotic fluid   Urinary Incontinence  Consults: None  Significant Findings/ Diagnostic Studies: 21 yo G3P0110 AAF at [redacted] weeks EGA with LOF today, followed by some mild lower abdomnal pains yet not preceded by pain, fever, trauma, infection, n/v, or other sx's. No ctxs.  No VB.  Prior h/o PPROM w classical CS at 27 weeks, later infant demise.  PNC at [facility].  17P for prevention weekly. PMHx: She  has a past medical history of Medical history non-contributory. Also,  has a past surgical history that includes Cesarean section., family history is not on file.,  reports that she has quit smoking. Her smoking use included Cigarettes. She smoked 1.00 pack per day. She has never used smokeless tobacco. She reports that she does not drink alcohol or use drugs.  She takes PNV and 17P; Also, has No Known Allergies.  Review of Systems  Constitutional: Negative for chills, fever and malaise/fatigue.  HENT: Negative for congestion, sinus pain and sore throat.   Eyes: Negative for blurred vision and pain.  Respiratory: Negative for cough and wheezing.   Cardiovascular: Negative for chest pain and leg swelling.  Gastrointestinal: Negative for abdominal pain, constipation, diarrhea, heartburn, nausea and vomiting.  Genitourinary: Negative for dysuria, frequency, hematuria and urgency.  Musculoskeletal: Negative for back pain, joint pain, myalgias and neck pain.  Skin: Negative for itching and rash.  Neurological: Negative for dizziness, tremors and weakness.  Endo/Heme/Allergies: Does not bruise/bleed easily.  Psychiatric/Behavioral: Negative for depression. The patient is not nervous/anxious and does not have insomnia.    Physical Exam  Constitutional: She is oriented to person, place, and time. She appears well-developed and well-nourished. No distress.  Genitourinary: Rectum normal and vagina normal. Pelvic exam was performed with patient supine. There is no rash or lesion on the right labia. There is no rash or lesion on the left labia. Vagina exhibits no lesion. No bleeding in the vagina. Cervix does not exhibit motion tenderness or lesion.  Genitourinary Comments: Cervix not dilated  Cardiovascular: Normal rate.   Pulmonary/Chest: Effort normal.  Abdominal: Soft. Bowel sounds are normal. She exhibits no distension. There is no tenderness. There is no rebound.  Musculoskeletal: Normal range of motion.  Neurological: She is alert and oriented to person, place, and time.  Skin: Skin is warm and dry.  Psychiatric: She has a normal mood and affect.  Vitals reviewed.  Procedures: FERN Neg..  Nitrazine Neg.  Results for orders placed or performed during the hospital encounter of 04/04/16  Urinalysis, Complete w Microscopic  Result Value Ref Range   Color, Urine YELLOW (A) YELLOW   APPearance HAZY (A) CLEAR   Specific Gravity, Urine 1.017 1.005 - 1.030   pH 7.0 5.0 - 8.0   Glucose, UA NEGATIVE NEGATIVE mg/dL   Hgb urine dipstick NEGATIVE NEGATIVE   Bilirubin Urine NEGATIVE NEGATIVE   Ketones, ur NEGATIVE NEGATIVE mg/dL   Protein, ur NEGATIVE NEGATIVE mg/dL   Nitrite NEGATIVE NEGATIVE   Leukocytes, UA NEGATIVE NEGATIVE   RBC / HPF NONE SEEN 0 - 5 RBC/hpf   WBC, UA 0-5 0 - 5 WBC/hpf   Bacteria, UA NONE SEEN NONE SEEN   Squamous Epithelial / LPF 6-30 (A) NONE SEEN   Mucous PRESENT  No s/sx PPROM; home with precautions discussed.  Discharge Condition: good  Disposition: 01-Home or Self Care  Diet: Regular diet  Discharge Activity: Activity as tolerated   Allergies as of 04/04/2016   No Known Allergies     Medication List    TAKE these medications   ondansetron 4 MG disintegrating  tablet Commonly known as:  ZOFRAN ODT Take 1 tablet (4 mg total) by mouth every 8 (eight) hours as needed for nausea or vomiting.   prenatal multivitamin Tabs tablet Take 1 tablet by mouth daily at 12 noon.       Total time spent taking care of this patient: 30 minutes  Signed: Letitia Libra 04/04/2016, 3:30 PM

## 2016-04-04 NOTE — Discharge Summary (Signed)
  See FPN 

## 2016-04-14 ENCOUNTER — Observation Stay
Admission: EM | Admit: 2016-04-14 | Discharge: 2016-04-14 | Disposition: A | Discharge status: Home / Self Care | Payer: Medicaid Other | Attending: Obstetrics & Gynecology | Admitting: Obstetrics & Gynecology

## 2016-04-14 ENCOUNTER — Ambulatory Visit
Admission: RE | Admit: 2016-04-14 | Discharge: 2016-04-14 | Disposition: A | Discharge status: Home / Self Care | Payer: Medicaid Other | Source: Ambulatory Visit | Attending: Maternal & Fetal Medicine | Admitting: Maternal & Fetal Medicine

## 2016-04-14 DIAGNOSIS — Z3A23 23 weeks gestation of pregnancy: Secondary | ICD-10-CM | POA: Insufficient documentation

## 2016-04-14 DIAGNOSIS — O26892 Other specified pregnancy related conditions, second trimester: Secondary | ICD-10-CM

## 2016-04-14 DIAGNOSIS — O42919 Preterm premature rupture of membranes, unspecified as to length of time between rupture and onset of labor, unspecified trimester: Secondary | ICD-10-CM

## 2016-04-14 DIAGNOSIS — R1030 Lower abdominal pain, unspecified: Secondary | ICD-10-CM | POA: Insufficient documentation

## 2016-04-14 DIAGNOSIS — Z87891 Personal history of nicotine dependence: Secondary | ICD-10-CM | POA: Insufficient documentation

## 2016-04-14 DIAGNOSIS — R1111 Vomiting without nausea: Secondary | ICD-10-CM

## 2016-04-14 LAB — URINALYSIS, COMPLETE (UACMP) WITH MICROSCOPIC
BILIRUBIN URINE: NEGATIVE
Glucose, UA: NEGATIVE mg/dL
KETONES UR: NEGATIVE mg/dL
Nitrite: NEGATIVE
Protein, ur: NEGATIVE mg/dL
Specific Gravity, Urine: 1.011 (ref 1.005–1.030)
pH: 6 (ref 5.0–8.0)

## 2016-04-14 LAB — CHLAMYDIA/NGC RT PCR (ARMC ONLY)
Chlamydia Tr: NOT DETECTED
N gonorrhoeae: NOT DETECTED

## 2016-04-14 MED ORDER — CEPHALEXIN 500 MG PO CAPS
500.0000 mg | ORAL_CAPSULE | Freq: Once | ORAL | Status: AC
  Administered 2016-04-14: 500 mg via ORAL
  Filled 2016-04-14: qty 1

## 2016-04-14 MED ORDER — LACTATED RINGERS IV BOLUS (SEPSIS)
1000.0000 mL | Freq: Once | INTRAVENOUS | Status: AC
  Administered 2016-04-14: 1000 mL via INTRAVENOUS

## 2016-04-14 MED ORDER — ONDANSETRON HCL 4 MG/2ML IJ SOLN
4.0000 mg | Freq: Four times a day (QID) | INTRAMUSCULAR | Status: DC | PRN
  Administered 2016-04-14: 4 mg via INTRAVENOUS
  Filled 2016-04-14 (×2): qty 2

## 2016-04-14 MED ORDER — CEPHALEXIN 500 MG PO CAPS: 500.0000 mg | ORAL_CAPSULE | Freq: Two times a day (BID) | ORAL | 0 refills | Status: DC

## 2016-04-14 NOTE — OB Triage Note (Addendum)
Patient presented to L&D complaining of lower abdominal pain that "feels like contractions" Patient denies leaking of fluid or vaginal bleeding.  Patient reports she has a history of preterm labor and a fetal demise at 29 weeks and a miscarriage. Previous c-section delivery at 26 weeks due to placental abruption.

## 2016-04-14 NOTE — Discharge Summary (Signed)
Physician Final Progress Note  Patient ID: Maria Richmond MRN: [id number] DOB/AGE: [birth year] 21 y.o.  Admit date: 04/14/2016 Admitting provider: Tresea Mall, CNM Discharge date: 04/14/2016   Admission Diagnoses: lower abdominal pain, vomiting   Discharge Diagnoses:  Active Problems:   Indication for care in labor and delivery, antepartum IUP at [redacted]w[redacted]d with positive fetal heart tones, acute uti  History of Present Illness: The patient is a 21 y.o. female G3P0110 at [redacted]w[redacted]d who presents for lower abdominal pain since last night and vomiting today. She states the pain happens every 8 to 20 minutes and lasts for 5 minutes. She has been vomiting throughout today and having difficulty keeping anything down. She admits to frequent urination but denies dysuria. She admits positive fetal movement. She denies LOF, VB.   Past Medical History:  Diagnosis Date  . Medical history non-contributory     Past Surgical History:  Procedure Laterality Date  . CESAREAN SECTION      No current facility-administered medications on file prior to encounter.    Current Outpatient Prescriptions on File Prior to Encounter  Medication Sig Dispense Refill  . ondansetron (ZOFRAN ODT) 4 MG disintegrating tablet Take 1 tablet (4 mg total) by mouth every 8 (eight) hours as needed for nausea or vomiting. 15 tablet 0  . Prenatal Vit-Fe Fumarate-FA (PRENATAL MULTIVITAMIN) TABS tablet Take 1 tablet by mouth daily at 12 noon.      No Known Allergies  Social History   Social History  . Marital status: Single    Spouse name: N/A  . Number of children: N/A  . Years of education: N/A   Occupational History  . Not on file.   Social History Main Topics  . Smoking status: Former Smoker    Packs/day: 1.00    Types: Cigarettes  . Smokeless tobacco: Never Used  . Alcohol use No  . Drug use: No  . Sexual activity: Yes   Other Topics Concern  . Not on file   Social History Narrative   ** Merged History  Encounter **        Physical Exam: BP 117/62   Pulse 92   Temp 98.3 F (36.8 C) (Oral)   Resp 17   Ht 5\' 2"  (1.575 m)   Wt 153 lb (69.4 kg)   LMP  (LMP Unknown)   BMI 27.98 kg/m   Gen: NAD CV: RRR Pulm: CTAB Pelvic: deferred Toco: negative Fetal Well Being: 150 bpm with 10x10 accelerations Ext: no evidence of DVT  Consults: None  Significant Findings/ Diagnostic Studies: labs:   Results for LUISAFERNANDA, HELINSKI (MRN [id number]) as of 04/14/2016 23:16  Ref. Range 04/14/2016 20:55  Specimen source GC/Chlam Unknown URINE, RANDOM  Chlamydia Tr Latest Ref Range: NOT DETECTED  NOT DETECTED  N gonorrhoeae Latest Ref Range: NOT DETECTED  NOT DETECTED  Appearance Latest Ref Range: CLEAR  CLOUDY (A)  Bacteria, UA Latest Ref Range: NONE SEEN  RARE (A)  Bilirubin Urine Latest Ref Range: NEGATIVE  NEGATIVE  Color, Urine Latest Ref Range: YELLOW  YELLOW (A)  Glucose Latest Ref Range: NEGATIVE mg/dL NEGATIVE  Hgb urine dipstick Latest Ref Range: NEGATIVE  SMALL (A)  Ketones, ur Latest Ref Range: NEGATIVE mg/dL NEGATIVE  Leukocytes, UA Latest Ref Range: NEGATIVE  LARGE (A)  Mucous Unknown PRESENT  Nitrite Latest Ref Range: NEGATIVE  NEGATIVE  pH Latest Ref Range: 5.0 - 8.0  6.0  Protein Latest Ref Range: NEGATIVE mg/dL NEGATIVE  RBC / HPF Latest Ref Range: 0 -  5 RBC/hpf 6-30  Specific Gravity, Urine Latest Ref Range: 1.005 - 1.030  1.011  Squamous Epithelial / LPF Latest Ref Range: NONE SEEN  TOO NUMEROUS TO C... (A)  WBC, UA Latest Ref Range: 0 - 5 WBC/hpf 6-30  URINE CULTURE Unknown Rpt    Procedures: NST  Discharge Condition: good  Disposition: 01-Home or Self Care  Diet: Regular diet  Discharge Activity: Activity as tolerated  Discharge Instructions    Discharge activity:  No Restrictions    Complete by:  As directed    Discharge diet:  No restrictions    Complete by:  As directed    No sexual activity restrictions    Complete by:  As directed    Notify physician for a  general feeling that "something is not right"    Complete by:  As directed    Notify physician for increase or change in vaginal discharge    Complete by:  As directed    Notify physician for intestinal cramps, with or without diarrhea, sometimes described as "gas pain"    Complete by:  As directed    Notify physician for leaking of fluid    Complete by:  As directed    Notify physician for low, dull backache, unrelieved by heat or Tylenol    Complete by:  As directed    Notify physician for menstrual like cramps    Complete by:  As directed    Notify physician for pelvic pressure    Complete by:  As directed    Notify physician for uterine contractions.  These may be painless and feel like the uterus is tightening or the baby is  "balling up"    Complete by:  As directed    Notify physician for vaginal bleeding    Complete by:  As directed    PRETERM LABOR:  Includes any of the follwing symptoms that occur between 20 - [redacted] weeks gestation.  If these symptoms are not stopped, preterm labor can result in preterm delivery, placing your baby at risk    Complete by:  As directed      Allergies as of 04/14/2016   No Known Allergies     Medication List    TAKE these medications   cephALEXin 500 MG capsule Commonly known as:  KEFLEX Take 1 capsule (500 mg total) by mouth 2 (two) times daily.   ondansetron 4 MG disintegrating tablet Commonly known as:  ZOFRAN ODT Take 1 tablet (4 mg total) by mouth every 8 (eight) hours as needed for nausea or vomiting.   prenatal multivitamin Tabs tablet Take 1 tablet by mouth daily at 12 noon.      Follow-up Information    [facility] Department Follow up.   Why:  go to regular scheduled prenatal appointment Contact information: [address] [place] [address] [place] [address] [phone]           Total time spent taking care of this patient: 20 minutes  Signed: Tresea Mall, CNM  04/14/2016, 11:09 PM

## 2016-04-16 LAB — URINE CULTURE

## 2016-04-24 ENCOUNTER — Other Ambulatory Visit: Payer: Self-pay | Admitting: *Deleted

## 2016-04-24 DIAGNOSIS — Z8751 Personal history of pre-term labor: Secondary | ICD-10-CM

## 2016-04-28 ENCOUNTER — Ambulatory Visit: Payer: Medicaid Other

## 2016-05-08 ENCOUNTER — Other Ambulatory Visit: Payer: Self-pay | Admitting: *Deleted

## 2016-05-08 DIAGNOSIS — Z8751 Personal history of pre-term labor: Secondary | ICD-10-CM

## 2016-05-10 ENCOUNTER — Encounter: Payer: Self-pay | Admitting: *Deleted

## 2016-05-10 ENCOUNTER — Observation Stay
Admission: EM | Admit: 2016-05-10 | Discharge: 2016-05-10 | Disposition: A | Discharge status: Home / Self Care | Payer: Medicaid Other | Attending: Obstetrics & Gynecology | Admitting: Obstetrics & Gynecology

## 2016-05-10 DIAGNOSIS — R109 Unspecified abdominal pain: Secondary | ICD-10-CM | POA: Diagnosis not present

## 2016-05-10 DIAGNOSIS — Z3A26 26 weeks gestation of pregnancy: Secondary | ICD-10-CM | POA: Insufficient documentation

## 2016-05-10 DIAGNOSIS — O26899 Other specified pregnancy related conditions, unspecified trimester: Secondary | ICD-10-CM | POA: Diagnosis present

## 2016-05-10 DIAGNOSIS — O26892 Other specified pregnancy related conditions, second trimester: Principal | ICD-10-CM | POA: Insufficient documentation

## 2016-05-10 DIAGNOSIS — Z87891 Personal history of nicotine dependence: Secondary | ICD-10-CM | POA: Insufficient documentation

## 2016-05-10 MED ORDER — ACETAMINOPHEN 325 MG PO TABS: 650.0000 mg | ORAL_TABLET | ORAL | Status: DC | PRN

## 2016-05-10 MED ORDER — ONDANSETRON HCL 4 MG/2ML IJ SOLN: 4.0000 mg | Freq: Four times a day (QID) | INTRAMUSCULAR | Status: DC | PRN

## 2016-05-10 NOTE — Discharge Instructions (Signed)
Please keep your next scheduled appointment with the [facility].  If you have any questions or concerns you may call the on call physician.  You may also call the nurses' desk at the [facility] at [phone].   If you have urgent concerns please go to the nearest emergency department for evaluation.

## 2016-05-10 NOTE — OB Triage Note (Signed)
Patient to OBS 1 via EMS.  Patient complains of right side pain 6/10 that is constant.  Denies any nausea or vomiting.  Last BM 05/09/16 patient reports was normal.  [facility].  Patient states that her blood pressure was 154/96.  Today her blood pressure is 126/71 upon arrival.  EMS reported blood pressure as 124/81.

## 2016-05-10 NOTE — Final Progress Note (Signed)
Physician Final Progress Note  Patient ID: Maria Richmond MRN: [id number] DOB/AGE: 1995-10-05 21 y.o.  Admit date: 05/10/2016 Admitting provider: Nadara Mustard, MD Discharge date: 05/10/2016  Admission Diagnoses: Abdominal pain in pregnancy  Discharge Diagnoses:  Active Problems:   Abdominal pain affecting pregnancy   Consults: None  Significant Findings/ Diagnostic Studies: 21 yo G3P0110 AA F at [redacted] weeks EGA with c/o lower abdominal pain noted this am, felt weak and dizzy as well. No VB.  Good FM.  Prior classical CS with neonatal demise.  PNC at [facility].  Takes 17P this pregnancy. PMHx: She  has a past medical history of Medical history non-contributory. Also,  has a past surgical history that includes Cesarean section., family history is not on file.,  reports that she has quit smoking. Her smoking use included Cigarettes. She smoked 1.00 pack per day. She has never used smokeless tobacco. She reports that she does not drink alcohol or use drugs.  She takes 17P and PNV. Also, has No Known Allergies.  Review of Systems  Constitutional: Negative for chills, fever and malaise/fatigue.  HENT: Negative for congestion, sinus pain and sore throat.   Eyes: Negative for blurred vision and pain.  Respiratory: Negative for cough and wheezing.   Cardiovascular: Negative for chest pain and leg swelling.  Gastrointestinal: Negative for abdominal pain, constipation, diarrhea, heartburn, nausea and vomiting.  Genitourinary: Negative for dysuria, frequency, hematuria and urgency.  Musculoskeletal: Negative for back pain, joint pain, myalgias and neck pain.  Skin: Negative for itching and rash.  Neurological: Negative for dizziness, tremors and weakness.  Endo/Heme/Allergies: Does not bruise/bleed easily.  Psychiatric/Behavioral: Negative for depression. The patient is not nervous/anxious and does not have insomnia.   Physical examination Constitutional NAD, Conversant  Skin No rashes, lesions or  ulceration. Normal palpated skin turgor. No nodularity.  Lungs: Clear to auscultation.No rales or wheezes. Normal Respiratory effort, no retractions.  Heart: NSR.  No murmurs or rubs appreciated. No periferal edema  Abdomen: GRAVID no mass or distension. Scar well healed. Non-tender.  No masses.  No HSM. No hernia  Extremities: Moves all appropriately.  Normal ROM for age. No lymphadenopathy.  Neuro: Grossly intact  Psych: Oriented to PPT.  Normal mood. Normal affect.     Pelvic:   Vulva: Normal appearance.  No lesions.  Vagina: No lesions or abnormalities noted.  Support: Normal pelvic support.  Urethra No masses tenderness or scarring.  Meatus Normal size without lesions or prolapse.  Cervix: CLOSED THICK HIGH  Anus: Normal exam.  No lesions.  Perineum: Normal exam.  No lesions.        Bimanual   Uterus: ENLARGED.  Adnexae: NOT EXAMINED  Cul-de-sac: Negative for abnormality.    Procedures: FHT 140s, no decels.  Discharge Condition: good  Disposition: 01-Home or Self Care  Diet: Regular diet  Discharge Activity: Activity as tolerated   Allergies as of 05/10/2016   No Known Allergies     Medication List    TAKE these medications   cephALEXin 500 MG capsule Commonly known as:  KEFLEX Take 1 capsule (500 mg total) by mouth 2 (two) times daily.   ondansetron 4 MG disintegrating tablet Commonly known as:  ZOFRAN ODT Take 1 tablet (4 mg total) by mouth every 8 (eight) hours as needed for nausea or vomiting.   prenatal multivitamin Tabs tablet Take 1 tablet by mouth daily at 12 noon.        Total time spent taking care of this patient: 15 minutes  Signed: Letitia Libra 05/10/2016, 10:58 AM

## 2016-05-10 NOTE — Discharge Summary (Signed)
  See FPN 

## 2016-05-12 ENCOUNTER — Ambulatory Visit
Admission: RE | Admit: 2016-05-12 | Discharge: 2016-05-12 | Disposition: A | Discharge status: Home / Self Care | Payer: Medicaid Other | Source: Ambulatory Visit | Attending: Maternal & Fetal Medicine | Admitting: Maternal & Fetal Medicine

## 2016-05-12 DIAGNOSIS — Z3A27 27 weeks gestation of pregnancy: Secondary | ICD-10-CM | POA: Diagnosis not present

## 2016-05-12 DIAGNOSIS — Z8751 Personal history of pre-term labor: Secondary | ICD-10-CM | POA: Insufficient documentation

## 2016-05-12 DIAGNOSIS — O42919 Preterm premature rupture of membranes, unspecified as to length of time between rupture and onset of labor, unspecified trimester: Secondary | ICD-10-CM

## 2016-05-25 ENCOUNTER — Observation Stay
Admission: EM | Admit: 2016-05-25 | Discharge: 2016-05-26 | Disposition: A | Discharge status: Home / Self Care | Payer: Medicaid Other | Attending: Obstetrics and Gynecology | Admitting: Obstetrics and Gynecology

## 2016-05-25 DIAGNOSIS — Z3490 Encounter for supervision of normal pregnancy, unspecified, unspecified trimester: Secondary | ICD-10-CM

## 2016-05-25 DIAGNOSIS — O26893 Other specified pregnancy related conditions, third trimester: Principal | ICD-10-CM | POA: Insufficient documentation

## 2016-05-25 DIAGNOSIS — N760 Acute vaginitis: Secondary | ICD-10-CM

## 2016-05-25 DIAGNOSIS — Z3A29 29 weeks gestation of pregnancy: Secondary | ICD-10-CM | POA: Insufficient documentation

## 2016-05-25 DIAGNOSIS — Z87891 Personal history of nicotine dependence: Secondary | ICD-10-CM | POA: Diagnosis not present

## 2016-05-25 DIAGNOSIS — R1030 Lower abdominal pain, unspecified: Secondary | ICD-10-CM | POA: Insufficient documentation

## 2016-05-25 DIAGNOSIS — B9689 Other specified bacterial agents as the cause of diseases classified elsewhere: Secondary | ICD-10-CM

## 2016-05-25 LAB — URINALYSIS, ROUTINE W REFLEX MICROSCOPIC
Bilirubin Urine: NEGATIVE
Glucose, UA: NEGATIVE mg/dL
Hgb urine dipstick: NEGATIVE
Ketones, ur: 20 mg/dL — AB
LEUKOCYTES UA: NEGATIVE
NITRITE: NEGATIVE
PH: 6 (ref 5.0–8.0)
Protein, ur: NEGATIVE mg/dL
SPECIFIC GRAVITY, URINE: 1.016 (ref 1.005–1.030)

## 2016-05-25 LAB — FETAL FIBRONECTIN: Fetal Fibronectin: NEGATIVE

## 2016-05-25 LAB — COMPREHENSIVE METABOLIC PANEL
ALBUMIN: 3.1 g/dL — AB (ref 3.5–5.0)
ALK PHOS: 65 U/L (ref 38–126)
ALT: 12 U/L — ABNORMAL LOW (ref 14–54)
ANION GAP: 7 (ref 5–15)
AST: 21 U/L (ref 15–41)
BILIRUBIN TOTAL: 0.3 mg/dL (ref 0.3–1.2)
BUN: 7 mg/dL (ref 6–20)
CO2: 24 mmol/L (ref 22–32)
CREATININE: 0.48 mg/dL (ref 0.44–1.00)
Calcium: 9.1 mg/dL (ref 8.9–10.3)
Chloride: 102 mmol/L (ref 101–111)
GFR calc Af Amer: >60 mL/min (ref >60)
Glucose, Bld: 82 mg/dL (ref 65–99)
Potassium: 3.2 mmol/L — ABNORMAL LOW (ref 3.5–5.1)
Sodium: 133 mmol/L — ABNORMAL LOW (ref 135–145)
Total Protein: 6.9 g/dL (ref 6.5–8.1)

## 2016-05-25 LAB — WET PREP, GENITAL
Sperm: NONE SEEN
Trich, Wet Prep: NONE SEEN
Yeast Wet Prep HPF POC: NONE SEEN

## 2016-05-25 LAB — CBC
HEMATOCRIT: 34.4 % — AB (ref 35.0–47.0)
HEMOGLOBIN: 11.9 g/dL — AB (ref 12.0–16.0)
MCH: 29.8 pg (ref 26.0–34.0)
MCHC: 34.4 g/dL (ref 32.0–36.0)
MCV: 86.5 fL (ref 80.0–100.0)
Platelets: 163 10*3/uL (ref 150–440)
RBC: 3.98 MIL/uL (ref 3.80–5.20)
RDW: 13.2 % (ref 11.5–14.5)
WBC: 7 10*3/uL (ref 3.6–11.0)

## 2016-05-25 LAB — LIPASE, BLOOD: LIPASE: 25 U/L (ref 11–51)

## 2016-05-25 MED ORDER — ACETAMINOPHEN 325 MG PO TABS: 650.0000 mg | ORAL_TABLET | Freq: Four times a day (QID) | ORAL | Status: DC | PRN

## 2016-05-25 MED ORDER — CYCLOBENZAPRINE HCL 10 MG PO TABS
5.0000 mg | ORAL_TABLET | Freq: Once | ORAL | Status: AC
  Administered 2016-05-25: 5 mg via ORAL
  Filled 2016-05-25: qty 1

## 2016-05-25 MED ORDER — ZOLPIDEM TARTRATE 5 MG PO TABS: 5.0000 mg | ORAL_TABLET | Freq: Once | ORAL | Status: DC

## 2016-05-25 MED ORDER — FAMOTIDINE IN NACL 20-0.9 MG/50ML-% IV SOLN
20.0000 mg | Freq: Once | INTRAVENOUS | Status: AC
  Administered 2016-05-25: 20 mg via INTRAVENOUS
  Filled 2016-05-25: qty 50

## 2016-05-25 MED ORDER — LACTATED RINGERS IV SOLN
INTRAVENOUS | Status: DC
  Administered 2016-05-25: 500 mL/h via INTRAVENOUS
  Administered 2016-05-26: 03:00:00 via INTRAVENOUS

## 2016-05-25 MED ORDER — ONDANSETRON HCL 4 MG/2ML IJ SOLN: 4.0000 mg | Freq: Four times a day (QID) | INTRAMUSCULAR | Status: DC | PRN

## 2016-05-26 DIAGNOSIS — O26893 Other specified pregnancy related conditions, third trimester: Secondary | ICD-10-CM | POA: Diagnosis not present

## 2016-05-26 MED ORDER — METRONIDAZOLE 500 MG PO TABS: 500.0000 mg | ORAL_TABLET | Freq: Two times a day (BID) | ORAL | 0 refills | Status: DC

## 2016-05-26 MED ORDER — METRONIDAZOLE 500 MG PO TABS: 500.0000 mg | ORAL_TABLET | Freq: Two times a day (BID) | ORAL | Status: DC

## 2016-05-26 NOTE — Discharge Instructions (Signed)

## 2016-05-26 NOTE — Discharge Summary (Signed)
TRIAGE NOTE to rule out Preterm Labor   History of Present Illness: Maria Richmond is a 21 y.o. G3P0110 at [redacted]w[redacted]d presenting to triage for preterm pain.  Prior classical c/s for PPROM and abruption at 27wks with subsequent infant demise.  Lower abdominal pain started last night at 4pm, no dysuria, mid lower back pain. No vaginal discharge, bleeding, upper abdominal pain. Good fetal movement.   Patient Active Problem List   Diagnosis Date Noted  . Pregnancy 05/25/2016  . Abdominal pain affecting pregnancy 05/10/2016  . Indication for care in labor and delivery, antepartum 04/14/2016  . Leakage of amniotic fluid 04/04/2016  . Quit smoking within past year 02/21/2016  . History of classical cesarean section 01/31/2016  . First trimester screening elevated DSR    . Preterm premature rupture of membranes (PPROM) with unknown onset of labor 07/17/2014    Past Medical History:  Diagnosis Date  . Medical history non-contributory     Past Surgical History:  Procedure Laterality Date  . CESAREAN SECTION      OB History  Gravida Para Term Preterm AB Living  3 1 0 1 1 0  SAB TAB Ectopic Multiple Live Births  1 0 0   1    # Outcome Date GA Lbr Len/2nd Weight Sex Delivery Anes PTL Lv  3 Current           2 Preterm 07/20/14     CS-LTranv   ND     Complications: Abruptio Placenta,Preterm premature rupture of membranes (PPROM) delivered, current hospitalization  1 SAB               Social History   Social History  . Marital status: Single    Spouse name: N/A  . Number of children: N/A  . Years of education: N/A   Social History Main Topics  . Smoking status: Former Smoker    Packs/day: 1.00    Types: Cigarettes  . Smokeless tobacco: Never Used  . Alcohol use No  . Drug use: No  . Sexual activity: Yes   Other Topics Concern  . None   Social History Narrative   ** Merged History Encounter **        No family history on file.  No Known Allergies  Prescriptions  Prior to Admission  Medication Sig Dispense Refill Last Dose  . cephALEXin (KEFLEX) 500 MG capsule Take 1 capsule (500 mg total) by mouth 2 (two) times daily. (Patient not taking: Reported on 05/10/2016) 14 capsule 0 Not Taking  . ondansetron (ZOFRAN ODT) 4 MG disintegrating tablet Take 1 tablet (4 mg total) by mouth every 8 (eight) hours as needed for nausea or vomiting. 15 tablet 0 Taking  . Prenatal Vit-Fe Fumarate-FA (PRENATAL MULTIVITAMIN) TABS tablet Take 1 tablet by mouth daily at 12 noon.   Taking    Review of Systems - See HPI for OB specific ROS.   Vitals:  BP 114/72 (BP Location: Left Arm)   Pulse 94   Temp 98.2 F (36.8 C) (Oral)   Resp 16   LMP  (LMP Unknown)  Physical Examination: CONSTITUTIONAL: Well-developed, well-nourished female in no acute distress.  HENT:  Normocephalic, atraumatic EYES: Conjunctivae and EOM are normal. No scleral icterus.  NECK: Normal range of motion, supple, SKIN: Skin is warm and dry. No rash noted. Not diaphoretic. No erythema. No pallor. NEUROLGIC: Alert and oriented to person, place, and time. No gross cranial nerve deficit noted. PSYCHIATRIC: Normal mood and affect. Normal behavior. Normal judgment  and thought content. CARDIOVASCULAR: Normal heart rate noted, regular rhythm RESPIRATORY: Effort and breath sounds normal, no problems with respiration noted ABDOMEN: Soft, nontender, nondistended, gravid.  Cervix:  Membranes:intact Fetal Monitoring:Baseline: 145 bpm, Variability: Good {> 6 bpm), Accelerations: Reactive and Decelerations: Absent Tocometer: Flat  Labs:  Results for orders placed or performed during the hospital encounter of 05/25/16 (from the past 24 hour(s))  Wet prep, genital   Collection Time: 05/25/16 10:16 PM  Result Value Ref Range   Yeast Wet Prep HPF POC NONE SEEN NONE SEEN   Trich, Wet Prep NONE SEEN NONE SEEN   Clue Cells Wet Prep HPF POC PRESENT (A) NONE SEEN   WBC, Wet Prep HPF POC FEW (A) NONE SEEN   Sperm  NONE SEEN   Fetal fibronectin   Collection Time: 05/25/16 10:16 PM  Result Value Ref Range   Fetal Fibronectin NEGATIVE NEGATIVE   Appearance, FETFIB CLEAR CLEAR  CBC   Collection Time: 05/25/16 10:16 PM  Result Value Ref Range   WBC 7.0 3.6 - 11.0 K/uL   RBC 3.98 3.80 - 5.20 MIL/uL   Hemoglobin 11.9 (L) 12.0 - 16.0 g/dL   HCT 98.1 (L) 19.1 - 47.8 %   MCV 86.5 80.0 - 100.0 fL   MCH 29.8 26.0 - 34.0 pg   MCHC 34.4 32.0 - 36.0 g/dL   RDW 29.5 62.1 - 30.8 %   Platelets 163 150 - 440 K/uL  Comprehensive metabolic panel   Collection Time: 05/25/16 10:16 PM  Result Value Ref Range   Sodium 133 (L) 135 - 145 mmol/L   Potassium 3.2 (L) 3.5 - 5.1 mmol/L   Chloride 102 101 - 111 mmol/L   CO2 24 22 - 32 mmol/L   Glucose, Bld 82 65 - 99 mg/dL   BUN 7 6 - 20 mg/dL   Creatinine, Ser 6.57 0.44 - 1.00 mg/dL   Calcium 9.1 8.9 - 84.6 mg/dL   Total Protein 6.9 6.5 - 8.1 g/dL   Albumin 3.1 (L) 3.5 - 5.0 g/dL   AST 21 15 - 41 U/L   ALT 12 (L) 14 - 54 U/L   Alkaline Phosphatase 65 38 - 126 U/L   Total Bilirubin 0.3 0.3 - 1.2 mg/dL   GFR calc non Af Amer >60 >60 mL/min   GFR calc Af Amer >60 >60 mL/min   Anion gap 7 5 - 15  Urinalysis, Routine w reflex microscopic   Collection Time: 05/25/16 10:16 PM  Result Value Ref Range   Color, Urine YELLOW (A) YELLOW   APPearance CLEAR (A) CLEAR   Specific Gravity, Urine 1.016 1.005 - 1.030   pH 6.0 5.0 - 8.0   Glucose, UA NEGATIVE NEGATIVE mg/dL   Hgb urine dipstick NEGATIVE NEGATIVE   Bilirubin Urine NEGATIVE NEGATIVE   Ketones, ur 20 (A) NEGATIVE mg/dL   Protein, ur NEGATIVE NEGATIVE mg/dL   Nitrite NEGATIVE NEGATIVE   Leukocytes, UA NEGATIVE NEGATIVE  Lipase, blood   Collection Time: 05/25/16 10:16 PM  Result Value Ref Range   Lipase 25 11 - 51 U/L    Imaging Studies: [place] Mfm Ob Follow Up  Result Date: 05/12/2016 ----------------------------------------------------------------------  OBSTETRICS REPORT                      (Signed Final  05/12/2016 01:57 pm) ---------------------------------------------------------------------- PATIENT INFO:  ID #:       [id number]  D.O.B.:  [birth year] (21 yrs)  Name:       Maria Richmond                   Visit Date: 05/12/2016 12:09 pm ---------------------------------------------------------------------- PERFORMED BY:  Performed By:     Revonda Humphrey RDMS    Referred By:       Donnamarie Poag                                                              LEATH ---------------------------------------------------------------------- SERVICE(S) PROVIDED:   [place] MFM OB FOLLOW UP                                   76816.01  ---------------------------------------------------------------------- INDICATIONS:   [redacted] weeks gestation of pregnancy                Z3A.27  ---------------------------------------------------------------------- FETAL EVALUATION:  Num Of Fetuses:     1  Fetal Heart         153  Rate(bpm):  Cardiac Activity:   Present  Presentation:       Vertex  Placenta:           Posterior Grade 1, No previa  AFI Sum(cm)     %Tile       Largest Pocket(cm)  13.26           38          4.71  RUQ(cm)       RLQ(cm)       LUQ(cm)        LLQ(cm)  3.92          1.68          4.71           2.95 ---------------------------------------------------------------------- BIOMETRY:  BPD:      67.1  mm     G. Age:  27w 0d         18  %    CI:        73.33   %    70 - 86                                                          FL/HC:       20.9  %    18.8 - 20.6  HC:       249   mm     G. Age:  27w 0d          8  %    HC/AC:       1.05       1.05 - 1.21  AC:      236.4  mm     G. Age:  28w 0d         50  %    FL/BPD:      77.6  %    71 - 87  FL:       52.1  mm     G. Age:  27w 5d  37  %    FL/AC:       22.0  %    20 - 24  HUM:      49.3  mm     G. Age:  29w 0d         71  %  Est. FW:    1120   gm     2 lb 8 oz     41  % ---------------------------------------------------------------------- GESTATIONAL AGE:  LMP:            34w 0d        Date:  09/17/15                 EDD:   06/23/16  U/S Today:     27w 3d                                        EDD:   08/08/16  Best:          27w 5d     Det. By:  U/S C R L  (01/31/16)    EDD:   08/06/16 ---------------------------------------------------------------------- ANATOMY:  Cavum:                 Visualized             Aortic Arch:            Within normal limits                         previously  Ventricles:            Normal appearance      Stomach:                Seen  Cerebellum:            Visualized             Abdominal Wall:         Visualized                         previously                                     previously  Posterior Fossa:       Visualized             Cord Vessels:           3 vessels,                         previously                                     visualized previously  Face:                  Orbits visualized      Kidneys:                Normal appearance                         previously  Lips:  Visualized             Bladder:                Seen                         previously  Heart:                 4-Chamber view         Spine:                  Visualized                         appears normal                                 previously  RVOT:                  Normal appearance      Upper Extremities:      Visualized                                                                        previously  LVOT:                  Normal appearance      Lower Extremities:      Visualized                                                                        previously ---------------------------------------------------------------------- IMPRESSION:  Dear Dr. Karilyn Cota,  Thank you for referring your patient to for a fetal growth  evalaution.  Her history is significant for a 25 week preterm  delivery.  Dating is by ultrasound performed at [facility]  on 01/31/16; measurements were consitent with 13 weeks 1  day.   At 23/[redacted] weeks  gestation (in the current pregnancy), her  transvaginal cervical length measured 4.1cm.  The fetal biometry correlates with established dating.  The  estimated fetal weight is at the 41   percentile. AGA.  The  amniotic fluid volume is normal and active fetal movements  are seen. .  Thank you for allowing [place] to participate in your patient's care.  Please do not hesitate to contact us if we can be of further  assistance. ----------------------------------------------------------------------                   Consuelo Pandy, MD Electronically Signed Final Report   05/12/2016 01:57 pm ----------------------------------------------------------------------    Assessment and Plan: Patient Active Problem List   Diagnosis Date Noted  . Pregnancy 05/25/2016  . Abdominal pain affecting pregnancy 05/10/2016  . Indication for care in labor and delivery, antepartum 04/14/2016  . Leakage of amniotic fluid 04/04/2016  . Quit smoking within past year 02/21/2016  . History of classical cesarean  section 01/31/2016  . First trimester screening elevated DSR    . Preterm premature rupture of membranes (PPROM) with unknown onset of labor 07/17/2014    1. Clue cells: Flagyl 2. D.c home in stable condition, appointment tomorrow at [facility]  Cline Cools, MD, MPH

## 2016-05-27 LAB — URINE CULTURE

## 2016-06-12 ENCOUNTER — Observation Stay
Admission: EM | Admit: 2016-06-12 | Discharge: 2016-06-12 | Disposition: A | Discharge status: Home / Self Care | Payer: Medicaid Other | Attending: Obstetrics & Gynecology | Admitting: Obstetrics & Gynecology

## 2016-06-12 DIAGNOSIS — O4702 False labor before 37 completed weeks of gestation, second trimester: Secondary | ICD-10-CM | POA: Diagnosis present

## 2016-06-12 DIAGNOSIS — Z3A27 27 weeks gestation of pregnancy: Secondary | ICD-10-CM | POA: Diagnosis not present

## 2016-06-12 DIAGNOSIS — R109 Unspecified abdominal pain: Secondary | ICD-10-CM

## 2016-06-12 DIAGNOSIS — O26899 Other specified pregnancy related conditions, unspecified trimester: Secondary | ICD-10-CM | POA: Diagnosis present

## 2016-06-12 LAB — URINALYSIS, ROUTINE W REFLEX MICROSCOPIC
Bilirubin Urine: NEGATIVE
GLUCOSE, UA: NEGATIVE mg/dL
HGB URINE DIPSTICK: NEGATIVE
Ketones, ur: NEGATIVE mg/dL
Nitrite: NEGATIVE
PH: 7 (ref 5.0–8.0)
Protein, ur: NEGATIVE mg/dL
Specific Gravity, Urine: 1.013 (ref 1.005–1.030)

## 2016-06-12 LAB — FETAL FIBRONECTIN: Fetal Fibronectin: NEGATIVE

## 2016-06-12 MED ORDER — ONDANSETRON HCL 4 MG/2ML IJ SOLN: 4.0000 mg | Freq: Four times a day (QID) | INTRAMUSCULAR | Status: DC | PRN

## 2016-06-12 MED ORDER — ACETAMINOPHEN 325 MG PO TABS: 650.0000 mg | ORAL_TABLET | ORAL | Status: DC | PRN

## 2016-06-12 NOTE — Discharge Instructions (Signed)
Discharge instructions given, all questions answered. Follow up instructions given.

## 2016-06-12 NOTE — OB Triage Note (Signed)
Recvd to OBS 2 with c/o abdominal cramping and contractions.  States history of preterm delivery at 27 weeks with previous pregnancy.  States that the baby is "balled up" all day and has begun to be painful.  Changed to gown and to bed.  EFM applied.  Plan of care discussed and oriented to room.   Agrees with plan and verbalizes understanding.

## 2016-06-14 NOTE — Discharge Summary (Signed)
Patient presented for evaluation of labor.  Patient had cervical exam by RN and this was reported to me. I reviewed her vital signs and fetal tracing, both of which were reassuring.  Patient was discharge as she was not laboring.  

## 2016-06-25 ENCOUNTER — Observation Stay
Admission: EM | Admit: 2016-06-25 | Discharge: 2016-06-26 | Disposition: A | Discharge status: Home / Self Care | Payer: Medicaid Other | Attending: Obstetrics and Gynecology | Admitting: Obstetrics and Gynecology

## 2016-06-25 ENCOUNTER — Observation Stay: Payer: Medicaid Other

## 2016-06-25 DIAGNOSIS — O99343 Other mental disorders complicating pregnancy, third trimester: Secondary | ICD-10-CM | POA: Diagnosis not present

## 2016-06-25 DIAGNOSIS — M7989 Other specified soft tissue disorders: Secondary | ICD-10-CM

## 2016-06-25 DIAGNOSIS — O1203 Gestational edema, third trimester: Secondary | ICD-10-CM | POA: Diagnosis not present

## 2016-06-25 DIAGNOSIS — O26893 Other specified pregnancy related conditions, third trimester: Secondary | ICD-10-CM | POA: Diagnosis not present

## 2016-06-25 DIAGNOSIS — F329 Major depressive disorder, single episode, unspecified: Secondary | ICD-10-CM | POA: Diagnosis not present

## 2016-06-25 DIAGNOSIS — Z87891 Personal history of nicotine dependence: Secondary | ICD-10-CM | POA: Insufficient documentation

## 2016-06-25 DIAGNOSIS — M79606 Pain in leg, unspecified: Secondary | ICD-10-CM

## 2016-06-25 DIAGNOSIS — O09899 Supervision of other high risk pregnancies, unspecified trimester: Secondary | ICD-10-CM

## 2016-06-25 DIAGNOSIS — O09213 Supervision of pregnancy with history of pre-term labor, third trimester: Secondary | ICD-10-CM

## 2016-06-25 DIAGNOSIS — Z7982 Long term (current) use of aspirin: Secondary | ICD-10-CM | POA: Diagnosis not present

## 2016-06-25 DIAGNOSIS — R600 Localized edema: Secondary | ICD-10-CM | POA: Diagnosis not present

## 2016-06-25 DIAGNOSIS — Z8759 Personal history of other complications of pregnancy, childbirth and the puerperium: Secondary | ICD-10-CM

## 2016-06-25 DIAGNOSIS — O280 Abnormal hematological finding on antenatal screening of mother: Secondary | ICD-10-CM

## 2016-06-25 DIAGNOSIS — Z3A34 34 weeks gestation of pregnancy: Secondary | ICD-10-CM

## 2016-06-25 HISTORY — DX: Depression, unspecified: F32.A

## 2016-06-25 HISTORY — DX: Major depressive disorder, single episode, unspecified: F32.9

## 2016-06-25 LAB — URINALYSIS, COMPLETE (UACMP) WITH MICROSCOPIC
BACTERIA UA: NONE SEEN
BILIRUBIN URINE: NEGATIVE
Glucose, UA: NEGATIVE mg/dL
Hgb urine dipstick: NEGATIVE
KETONES UR: NEGATIVE mg/dL
LEUKOCYTES UA: NEGATIVE
Nitrite: NEGATIVE
PROTEIN: NEGATIVE mg/dL
RBC / HPF: NONE SEEN RBC/hpf (ref 0–5)
Specific Gravity, Urine: 1.015 (ref 1.005–1.030)
pH: 7 (ref 5.0–8.0)

## 2016-06-25 LAB — PROTEIN / CREATININE RATIO, URINE
Creatinine, Urine: 79 mg/dL
Protein Creatinine Ratio: 0.09 mg/mg{Cre} (ref 0.00–0.15)
Total Protein, Urine: 7 mg/dL

## 2016-06-25 LAB — CBC
HCT: 31.8 % — ABNORMAL LOW (ref 35.0–47.0)
HEMOGLOBIN: 10.9 g/dL — AB (ref 12.0–16.0)
MCH: 29.4 pg (ref 26.0–34.0)
MCHC: 34.3 g/dL (ref 32.0–36.0)
MCV: 85.8 fL (ref 80.0–100.0)
PLATELETS: 168 10*3/uL (ref 150–440)
RBC: 3.71 MIL/uL — AB (ref 3.80–5.20)
RDW: 13.1 % (ref 11.5–14.5)
WBC: 8.3 10*3/uL (ref 3.6–11.0)

## 2016-06-25 LAB — COMPREHENSIVE METABOLIC PANEL
ALBUMIN: 3.1 g/dL — AB (ref 3.5–5.0)
ALT: 15 U/L (ref 14–54)
ANION GAP: 6 (ref 5–15)
AST: 23 U/L (ref 15–41)
Alkaline Phosphatase: 84 U/L (ref 38–126)
BUN: 6 mg/dL (ref 6–20)
CHLORIDE: 106 mmol/L (ref 101–111)
CO2: 24 mmol/L (ref 22–32)
Calcium: 9 mg/dL (ref 8.9–10.3)
Creatinine, Ser: 0.51 mg/dL (ref 0.44–1.00)
GFR calc Af Amer: >60 mL/min (ref >60)
GFR calc non Af Amer: >60 mL/min (ref >60)
GLUCOSE: 104 mg/dL — AB (ref 65–99)
Potassium: 3.5 mmol/L (ref 3.5–5.1)
SODIUM: 136 mmol/L (ref 135–145)
Total Bilirubin: 0.2 mg/dL — ABNORMAL LOW (ref 0.3–1.2)
Total Protein: 6.7 g/dL (ref 6.5–8.1)

## 2016-06-25 MED ORDER — ACETAMINOPHEN 325 MG PO TABS
650.0000 mg | ORAL_TABLET | ORAL | Status: DC | PRN
  Administered 2016-06-25: 650 mg via ORAL
  Filled 2016-06-25: qty 2

## 2016-06-25 MED ORDER — ONDANSETRON 4 MG PO TBDP
ORAL_TABLET | ORAL | Status: AC
  Administered 2016-06-25: 4 mg via ORAL
  Filled 2016-06-25: qty 1

## 2016-06-25 MED ORDER — ONDANSETRON 4 MG PO TBDP
4.0000 mg | ORAL_TABLET | Freq: Four times a day (QID) | ORAL | Status: DC | PRN
  Administered 2016-06-25: 4 mg via ORAL

## 2016-06-25 NOTE — OB Triage Note (Signed)
Patient arrived by EMS to OBS 4 for evaluation of swelling in bilateral hands and feet x 2days.  Denies headache, visual disturbances, epigastric pain. Reports positive fetal movement, denies leaking of fluid, vaginal bleeding or contractions. Abdomen palpates soft, fetal movement palpated.  EFM explained and applied. Discussed plan of care. Patient verbalized understanding.

## 2016-06-25 NOTE — H&P (Addendum)
Obstetric H&P   Chief Complaint: Swelling  Prenatal Care Provider: [facility] - Delivery 07/16/16 [facility]   History of Present Illness: 21 y.o. G3P0110 [redacted]w[redacted]d by 13 week [place] derived EDC of  8/12018, presenting to L&D via EMS for generalized edema, and lower extremity pain bilaterally.  She states the swelling is not dependent on time of day, does not improve with elevation of her extremities.  She does report occasional scotomata, no headaches, RUQ or epigastric pain (although she was seen for abdominal pain with negative work up on 05/25/16).  She reports +FM, no LOF, no VB, no ctx.  Does have sharp bilateral and positional groin pain consistent with round ligament pain.   The patient obstetric history is notable for a prior 26 week loss secondary to PPROM and subsequent abruption resulting in demise of the infant.  Delivery was via classical cesarean section and she is scheduled for repeat C-section at 37 week this pregnancy.  1st trimester screening this pregnancy revealed an elevated DSR 1:190 with decreased PAPP-a level <2.5%ile.   The patient is receiving weekly Makena [facility] secondary to her history of PPROM.  Last growth scan 05/12/16 at DP 27 weeks AFI 13.26cm, EFW 2lbs 8oz c/w 41%ile  Clinic [facility] Prenatal Labs  Dating 13 week [place] Blood type: --/--/O POS (02/21 1428)   Genetic Screen 1 Screen: POS 1/190   NIPS: Negative 02/12/16 Antibody:negative  Anatomic [place]  Rubella: Immune Varicella: Non-Immune  GTT Third trimester: 88 RPR: NR  Rhogam N/A HBsAg: negative  TDaP vaccine 05/13/16   Flu Shot: Declined HIV: negative  Baby Food Breast                            GBS: unknown  Contraception Depo Provera Pap:   Patient Active Problem List   Diagnosis Date Noted  . Labor and delivery indication for care or intervention 06/25/2016  . High risk pregnancy with low PAPPA 06/25/2016  . High risk pregnancy due to history of preterm labor, third trimester 06/25/2016  . History of  placenta abruption 06/25/2016  . Quit smoking within past year 02/21/2016  . History of classical cesarean section 01/31/2016  . First trimester screening elevated DSR    . Preterm premature rupture of membranes (PPROM) with unknown onset of labor 07/17/2014   Review of Systems: 10 point review of systems negative unless otherwise noted in HPI  Past Medical History: Past Medical History:  Diagnosis Date  . Depression   . Medical history non-contributory   . Preterm labor     Past Surgical History: Past Surgical History:  Procedure Laterality Date  . CESAREAN SECTION     Family History: History reviewed. No pertinent family history.  Social History: Social History   Social History  . Marital status: Single    Spouse name: N/A  . Number of children: N/A  . Years of education: N/A   Occupational History  . Not on file.   Social History Main Topics  . Smoking status: Former Smoker    Packs/day: 1.00    Types: Cigarettes  . Smokeless tobacco: Never Used  . Alcohol use No  . Drug use: No     Comment: positive uds for MJ, states no use recently  . Sexual activity: Yes   Other Topics Concern  . Not on file   Social History Narrative   ** Merged History Encounter **  Medications: Prior to Admission medications   Medication Sig Start Date End Date Taking? Authorizing Provider  aspirin EC 81 MG tablet Take 81 mg by mouth daily.   Yes [provider]  hydroxyprogesterone caproate (MAKENA) 250 mg/mL OIL injection Inject 250 mg into the muscle once.   Yes [provider]  Prenatal Vit-Fe Fumarate-FA (PRENATAL MULTIVITAMIN) TABS tablet Take 1 tablet by mouth daily at 12 noon.   Yes [provider]  metroNIDAZOLE (FLAGYL) 500 MG tablet Take 1 tablet (500 mg total) by mouth every 12 (twelve) hours. Patient not taking: Reported on 06/25/2016 05/26/16   Christeen DouglasBeasley, Bethany, MD  ondansetron (ZOFRAN ODT) 4 MG disintegrating tablet Take 1 tablet (4  mg total) by mouth every 8 (eight) hours as needed for nausea or vomiting. Patient not taking: Reported on 06/25/2016 02/14/16   Rockne MenghiniNorman, Anne-Caroline, MD    Allergies: No Known Allergies  Physical Exam: Vitals: Blood pressure 129/82, pulse (!) 109, temperature 98.4 F (36.9 C), temperature source Oral, resp. rate 18, height 5\' 2"  (1.575 m), weight 165 lb (74.8 kg), unknown if currently breastfeeding.  Urine Dip Protein: negative on UA  FHT: 140, moderate, +accels, no decels Toco: occasional irritability   General: NAD HEENT: normocephalic, anicteric Pulmonary: No increased work of breathing Cardiovascular: RRR, distal pulses 2+ Abdomen: Gravid, non-tender Genitourinary: deferred Extremities: no edema, erythema, or tenderness Neurologic: Grossly intact Psychiatric: mood appropriate, affect full  Labs: Results for orders placed or performed during the hospital encounter of 06/25/16 (from the past 24 hour(s))  Urinalysis, Complete w Microscopic     Status: Abnormal   Collection Time: 06/25/16  8:02 PM  Result Value Ref Range   Color, Urine YELLOW YELLOW   APPearance CLEAR CLEAR   Specific Gravity, Urine 1.015 1.005 - 1.030   pH 7.0 5.0 - 8.0   Glucose, UA NEGATIVE NEGATIVE mg/dL   Hgb urine dipstick NEGATIVE NEGATIVE   Bilirubin Urine NEGATIVE NEGATIVE   Ketones, ur NEGATIVE NEGATIVE mg/dL   Protein, ur NEGATIVE NEGATIVE mg/dL   Nitrite NEGATIVE NEGATIVE   Leukocytes, UA NEGATIVE NEGATIVE   Squamous Epithelial / LPF 0-5 (A) NONE SEEN   WBC, UA 0-5 0 - 5 WBC/hpf   RBC / HPF NONE SEEN 0 - 5 RBC/hpf   Bacteria, UA NONE SEEN NONE SEEN   Mucous PRESENT     Assessment: 21 y.o. G3P0110 6574w0d by 13 week US derived EDC of  8/12018, generalized swelling  Plan: 1)Lower extremity swelling - normotensive, bilateral.  Weight has been stable in the third trimester per [facility] records.  Given increased risk of preeclampsia with low PAPP-a will obtain baseline preeclampsia labs.  Given  symmetric I have overall low concern for  2) Fetus - cat I tracing  3) PNL - see above  4) TDAP - UTD  5) Low PAPP-A - associated with poor fetal outcome including growth restriction, IUFD, aburption and preeclampsia.  One of the highest risk factors for abruption is a history of prior abruption, although her underlying etiology seems to have been PPROM which increased risk of abruption.  She has not had a repeat growth scan in the third trimester, and she would likely benefit from starting at least weekly antepartum testing now that she is 34 weeks.  She has follow up at [facility] next week will add on growth scan. - continue low dose ASA at >[redacted] weeks gestation as per USPTF recommendation "Low-Dose Aspirin Use for the Prevention of Morbidity and Mortality From Preeclampsia: Preventive  Medicine"  furthermore endorsed by ACOG, WHO, and NIH based on evidence level B for the prevention of preeclampsia  In women deemed high risk  (diabetes, renal disease, chronic hypertension, history of preeclampsia in prior gestation, autoimmune diseases, or multifetal gestations)  6) Disposition - pending labs and dopplers

## 2016-06-26 DIAGNOSIS — R600 Localized edema: Secondary | ICD-10-CM | POA: Diagnosis not present

## 2016-06-26 DIAGNOSIS — O26893 Other specified pregnancy related conditions, third trimester: Secondary | ICD-10-CM | POA: Diagnosis not present

## 2016-06-26 DIAGNOSIS — Z3A34 34 weeks gestation of pregnancy: Secondary | ICD-10-CM | POA: Diagnosis not present

## 2016-06-26 MED ORDER — METOCLOPRAMIDE HCL 5 MG/ML IJ SOLN
INTRAMUSCULAR | Status: AC
  Filled 2016-06-26: qty 2

## 2016-06-26 MED ORDER — ONDANSETRON HCL 4 MG/2ML IJ SOLN
INTRAMUSCULAR | Status: AC
  Filled 2016-06-26: qty 2

## 2016-06-26 MED ORDER — TERBUTALINE SULFATE 1 MG/ML IJ SOLN
INTRAMUSCULAR | Status: AC
  Filled 2016-06-26: qty 1

## 2016-06-26 NOTE — Final Progress Note (Signed)
Physician Final Progress Note  Patient ID: Maria Richmond MRN: [id number] DOB/AGE: April 17, 1995 21 y.o.  Admit date: 06/25/2016 Admitting provider: Vena Austria, MD Discharge date: 06/26/2016   Admission Diagnoses: Swelling  Discharge Diagnoses:  Active Problems:   Labor and delivery indication for care or intervention   High risk pregnancy with low PAPPA   High risk pregnancy due to history of preterm labor, third trimester   History of placenta abruption   Consults: None  Significant Findings/ Diagnostic Studies: Results for orders placed or performed during the hospital encounter of 06/25/16 (from the past 24 hour(s))  Protein / creatinine ratio, urine     Status: None   Collection Time: 06/25/16  8:02 PM  Result Value Ref Range   Creatinine, Urine 79 mg/dL   Total Protein, Urine 7 mg/dL   Protein Creatinine Ratio 0.09 0.00 - 0.15 mg/mg[Cre]  Urinalysis, Complete w Microscopic     Status: Abnormal   Collection Time: 06/25/16  8:02 PM  Result Value Ref Range   Color, Urine YELLOW YELLOW   APPearance CLEAR CLEAR   Specific Gravity, Urine 1.015 1.005 - 1.030   pH 7.0 5.0 - 8.0   Glucose, UA NEGATIVE NEGATIVE mg/dL   Hgb urine dipstick NEGATIVE NEGATIVE   Bilirubin Urine NEGATIVE NEGATIVE   Ketones, ur NEGATIVE NEGATIVE mg/dL   Protein, ur NEGATIVE NEGATIVE mg/dL   Nitrite NEGATIVE NEGATIVE   Leukocytes, UA NEGATIVE NEGATIVE   Squamous Epithelial / LPF 0-5 (A) NONE SEEN   WBC, UA 0-5 0 - 5 WBC/hpf   RBC / HPF NONE SEEN 0 - 5 RBC/hpf   Bacteria, UA NONE SEEN NONE SEEN   Mucous PRESENT   CBC     Status: Abnormal   Collection Time: 06/25/16  9:02 PM  Result Value Ref Range   WBC 8.3 3.6 - 11.0 K/uL   RBC 3.71 (L) 3.80 - 5.20 MIL/uL   Hemoglobin 10.9 (L) 12.0 - 16.0 g/dL   HCT 62.9 (L) 52.8 - 41.3 %   MCV 85.8 80.0 - 100.0 fL   MCH 29.4 26.0 - 34.0 pg   MCHC 34.3 32.0 - 36.0 g/dL   RDW 24.4 01.0 - 27.2 %   Platelets 168 150 - 440 K/uL  Comprehensive metabolic  panel     Status: Abnormal   Collection Time: 06/25/16  9:02 PM  Result Value Ref Range   Sodium 136 135 - 145 mmol/L   Potassium 3.5 3.5 - 5.1 mmol/L   Chloride 106 101 - 111 mmol/L   CO2 24 22 - 32 mmol/L   Glucose, Bld 104 (H) 65 - 99 mg/dL   BUN 6 6 - 20 mg/dL   Creatinine, Ser 5.36 0.44 - 1.00 mg/dL   Calcium 9.0 8.9 - 64.4 mg/dL   Total Protein 6.7 6.5 - 8.1 g/dL   Albumin 3.1 (L) 3.5 - 5.0 g/dL   AST 23 15 - 41 U/L   ALT 15 14 - 54 U/L   Alkaline Phosphatase 84 38 - 126 U/L   Total Bilirubin 0.2 (L) 0.3 - 1.2 mg/dL   GFR calc non Af Amer >60 >60 mL/min   GFR calc Af Amer >60 >60 mL/min   Anion gap 6 5 - 15   US Venous Img Lower Bilateral  Result Date: 06/26/2016 CLINICAL DATA:  21 year old female with bilateral lower extremity pain and swelling. EXAM: BILATERAL LOWER EXTREMITY VENOUS DOPPLER ULTRASOUND TECHNIQUE: Gray-scale sonography with graded compression, as well as color Doppler and duplex ultrasound were  performed to evaluate the lower extremity deep venous systems from the level of the common femoral vein and including the common femoral, femoral, profunda femoral, popliteal and calf veins including the posterior tibial, peroneal and gastrocnemius veins when visible. The superficial great saphenous vein was also interrogated. Spectral Doppler was utilized to evaluate flow at rest and with distal augmentation maneuvers in the common femoral, femoral and popliteal veins. COMPARISON:  None. FINDINGS: RIGHT LOWER EXTREMITY Common Femoral Vein: No evidence of thrombus. Normal compressibility, respiratory phasicity and response to augmentation. Saphenofemoral Junction: No evidence of thrombus. Normal compressibility and flow on color Doppler imaging. Profunda Femoral Vein: No evidence of thrombus. Normal compressibility and flow on color Doppler imaging. Femoral Vein: No evidence of thrombus. Normal compressibility, respiratory phasicity and response to augmentation. Popliteal Vein: No  evidence of thrombus. Normal compressibility, respiratory phasicity and response to augmentation. Calf Veins: No evidence of thrombus. Normal compressibility and flow on color Doppler imaging. Superficial Great Saphenous Vein: No evidence of thrombus. Normal compressibility and flow on color Doppler imaging. Venous Reflux:  None. Other Findings:  None. LEFT LOWER EXTREMITY Common Femoral Vein: No evidence of thrombus. Normal compressibility, respiratory phasicity and response to augmentation. Saphenofemoral Junction: No evidence of thrombus. Normal compressibility and flow on color Doppler imaging. Profunda Femoral Vein: No evidence of thrombus. Normal compressibility and flow on color Doppler imaging. Femoral Vein: No evidence of thrombus. Normal compressibility, respiratory phasicity and response to augmentation. Popliteal Vein: No evidence of thrombus. Normal compressibility, respiratory phasicity and response to augmentation. Calf Veins: No evidence of thrombus. Normal compressibility and flow on color Doppler imaging. Superficial Great Saphenous Vein: No evidence of thrombus. Normal compressibility and flow on color Doppler imaging. Venous Reflux:  None. Other Findings:  None. IMPRESSION: No evidence of DVT within either lower extremity. Electronically Signed   By: Elgie Collard M.D.   On: 06/26/2016 00:13    Procedures: NST  Discharge Condition: good  Disposition: 01-Home or Self Care  Diet: Regular diet  Discharge Activity: Activity as tolerated  Discharge Instructions    Discharge activity:  No Restrictions    Complete by:  As directed    Discharge diet:  No restrictions    Complete by:  As directed    Fetal Kick Count:  Lie on our left side for one hour after a meal, and count the number of times your baby kicks.  If it is less than 5 times, get up, move around and drink some juice.  Repeat the test 30 minutes later.  If it is still less than 5 kicks in an hour, notify your doctor.     Complete by:  As directed    LABOR:  When conractions begin, you should start to time them from the beginning of one contraction to the beginning  of the next.  When contractions are 5 - 10 minutes apart or less and have been regular for at least an hour, you should call your health care provider.    Complete by:  As directed    No sexual activity restrictions    Complete by:  As directed    Notify physician for bleeding from the vagina    Complete by:  As directed    Notify physician for blurring of vision or spots before the eyes    Complete by:  As directed    Notify physician for chills or fever    Complete by:  As directed    Notify physician for fainting spells, "black  outs" or loss of consciousness    Complete by:  As directed    Notify physician for increase in vaginal discharge    Complete by:  As directed    Notify physician for leaking of fluid    Complete by:  As directed    Notify physician for pain or burning when urinating    Complete by:  As directed    Notify physician for pelvic pressure (sudden increase)    Complete by:  As directed    Notify physician for severe or continued nausea or vomiting    Complete by:  As directed    Notify physician for sudden gushing of fluid from the vagina (with or without continued leaking)    Complete by:  As directed    Notify physician for sudden, constant, or occasional abdominal pain    Complete by:  As directed    Notify physician if baby moving less than usual    Complete by:  As directed      Allergies as of 06/26/2016   No Known Allergies     Medication List    STOP taking these medications   metroNIDAZOLE 500 MG tablet Commonly known as:  FLAGYL     TAKE these medications   aspirin EC 81 MG tablet Take 81 mg by mouth daily.   hydroxyprogesterone caproate 250 mg/mL Oil injection Commonly known as:  MAKENA Inject 250 mg into the muscle once.   ondansetron 4 MG disintegrating tablet Commonly known as:  ZOFRAN  ODT Take 1 tablet (4 mg total) by mouth every 8 (eight) hours as needed for nausea or vomiting.   prenatal multivitamin Tabs tablet Take 1 tablet by mouth daily at 12 noon.      Follow-up Information    Conard Novak, MD. Go on 07/01/2016.   Specialty:  Obstetrics and Gynecology Contact information: [address] [place] [address] [phone]           Total time spent taking care of this patient: 60 minutes  Signed: Vena Austria 06/26/2016, 12:28 AM

## 2016-06-26 NOTE — OB Triage Note (Signed)
Patient given discharge instructions including follow up instructions, fetal kick counts, and preterm labor precautions. Patient and significant other verbalized understanding. Discharged, ambulatory in stable condition.

## 2016-06-27 ENCOUNTER — Telehealth: Payer: Self-pay | Admitting: Obstetrics and Gynecology

## 2016-06-27 ENCOUNTER — Other Ambulatory Visit: Payer: Self-pay | Admitting: Obstetrics and Gynecology

## 2016-06-27 DIAGNOSIS — Z3A34 34 weeks gestation of pregnancy: Secondary | ICD-10-CM

## 2016-06-27 DIAGNOSIS — O09899 Supervision of other high risk pregnancies, unspecified trimester: Secondary | ICD-10-CM

## 2016-06-27 DIAGNOSIS — O280 Abnormal hematological finding on antenatal screening of mother: Principal | ICD-10-CM

## 2016-06-27 NOTE — Telephone Encounter (Signed)
Order in.

## 2016-06-27 NOTE — Telephone Encounter (Signed)
Pt is schedule Monday, June 25 at 4:30 for Growth scan and will follow up with Dr. [name] for Delivery plans appt. Dr. Bonney AidStaebler would you please put in an order for the Growth Scan.

## 2016-06-27 NOTE — Telephone Encounter (Signed)
-----   Message from Vena AustriaAndreas Staebler, MD sent at 06/26/2016 12:24 AM EDT ----- Regarding: Growth scan Patient has follow up 6/26 with [name] needs growth scan and NST with that visit

## 2016-06-30 ENCOUNTER — Ambulatory Visit (INDEPENDENT_AMBULATORY_CARE_PROVIDER_SITE_OTHER): Payer: Medicaid Other

## 2016-06-30 ENCOUNTER — Other Ambulatory Visit: Payer: Self-pay | Admitting: Nurse Practitioner

## 2016-06-30 DIAGNOSIS — O09899 Supervision of other high risk pregnancies, unspecified trimester: Secondary | ICD-10-CM | POA: Diagnosis not present

## 2016-06-30 DIAGNOSIS — Z3A34 34 weeks gestation of pregnancy: Secondary | ICD-10-CM | POA: Diagnosis not present

## 2016-06-30 DIAGNOSIS — O280 Abnormal hematological finding on antenatal screening of mother: Secondary | ICD-10-CM

## 2016-06-30 DIAGNOSIS — Z3689 Encounter for other specified antenatal screening: Secondary | ICD-10-CM

## 2016-07-01 ENCOUNTER — Telehealth: Payer: Self-pay | Admitting: Obstetrics and Gynecology

## 2016-07-01 ENCOUNTER — Ambulatory Visit (INDEPENDENT_AMBULATORY_CARE_PROVIDER_SITE_OTHER): Payer: Medicaid Other | Admitting: Obstetrics and Gynecology

## 2016-07-01 VITALS — BP 126/74 | Wt 178.0 lb

## 2016-07-01 DIAGNOSIS — Z3A34 34 weeks gestation of pregnancy: Secondary | ICD-10-CM

## 2016-07-01 DIAGNOSIS — O42919 Preterm premature rupture of membranes, unspecified as to length of time between rupture and onset of labor, unspecified trimester: Secondary | ICD-10-CM

## 2016-07-01 DIAGNOSIS — O280 Abnormal hematological finding on antenatal screening of mother: Secondary | ICD-10-CM | POA: Diagnosis not present

## 2016-07-01 DIAGNOSIS — Z98891 History of uterine scar from previous surgery: Secondary | ICD-10-CM

## 2016-07-01 DIAGNOSIS — O09213 Supervision of pregnancy with history of pre-term labor, third trimester: Secondary | ICD-10-CM

## 2016-07-01 DIAGNOSIS — Z8759 Personal history of other complications of pregnancy, childbirth and the puerperium: Secondary | ICD-10-CM

## 2016-07-01 DIAGNOSIS — O09899 Supervision of other high risk pregnancies, unspecified trimester: Secondary | ICD-10-CM

## 2016-07-01 NOTE — Telephone Encounter (Signed)
-----  Message from Conard Novak, MD sent at 07/01/2016 11:26 AM EDT ----- Regarding: Schedule Surgery Surgery Booking Request Patient Full Name: Maria Richmond  MRN: [id number]  DOB: 27-Nov-1995  Surgeon: Thomasene Mohair, MD  Requested Surgery Date and Time: 07/17/16 Primary Diagnosis and Code: history of cesarean Secondary Diagnosis and Code:  Surgical Procedure: Cesarean Section L&D Notification: Yes Admission Status: surgery admit Length of Surgery: 60 minutes Special Case Needs: none H&P: tbd (date) Phone Interview or Office Pre-Admit: office Interpreter: n/a Language: english Medical Clearance: n/a Special Scheduling Instructions: none

## 2016-07-01 NOTE — Telephone Encounter (Signed)
Lmtrc

## 2016-07-01 NOTE — Progress Notes (Signed)
OB History & Physical   History of Present Illness:  Chief Complaint: referral from [facility] for delivery planning  HPI:  Maria Richmond is a 21 y.o. G26P0110 female at [redacted]w[redacted]d dated by 13 week ultrasound.  Her pregnancy has been complicated by a history of a prior classical cesarean delivery at 72 weeks (PPROM with abruption, neonatal demise), bacterial vaginosis, positive marijuana screen, abnormal first trimester screening (neg NIPT) with low PAPPA, history of depression.    The patient has also had numerous presentations to Labor and Delivery during her pregnancy for assorted complaints.   She denies contractions.   She denies leakage of fluid.   She denies vaginal bleeding.   She reports fetal movement.    Maternal Medical History:   Past Medical History:  Diagnosis Date  . Depression   . Medical history non-contributory   . Preterm labor     Past Surgical History:  Procedure Laterality Date  . CESAREAN SECTION     Allergies: No Known Allergies    Medication Sig Start Date End Date Taking? Authorizing Provider  hydroxyprogesterone caproate (MAKENA) 250 mg/mL OIL injection Inject 250 mg into the muscle once.   Yes [provider]  Prenatal Vit-Fe Fumarate-FA (PRENATAL MULTIVITAMIN) TABS tablet Take 1 tablet by mouth daily at 12 noon.   Yes [provider]  aspirin EC 81 MG tablet Take 81 mg by mouth daily.    [provider]  ondansetron (ZOFRAN ODT) 4 MG disintegrating tablet Take 1 tablet (4 mg total) by mouth every 8 (eight) hours as needed for nausea or vomiting. Patient not taking: Reported on 06/25/2016 02/14/16   Rockne Menghini, MD    OB History  Gravida Para Term Preterm AB Living  3 1 0 1 1 0  SAB TAB Ectopic Multiple Live Births  1 0 0   1    # Outcome Date GA Lbr Len/2nd Weight Sex Delivery Anes PTL Lv  3 Current           2 Preterm 07/20/14    F CS-Classical   ND     Complications: Abruptio Placenta,Preterm premature rupture of  membranes (PPROM) delivered, current hospitalization  1 SAB               Prenatal care site: [facility]  Social History: She  reports that she has quit smoking. Her smoking use included Cigarettes. She smoked 1.00 pack per day. She has never used smokeless tobacco. She reports that she does not drink alcohol or use drugs.  Family History: She denies a family of gynecologic cancers  Review of Systems  Constitutional: Negative.   HENT: Negative.   Eyes: Negative.   Respiratory: Negative.   Cardiovascular: Negative.   Gastrointestinal: Negative.   Genitourinary: Negative.   Musculoskeletal: Negative.   Skin: Negative.   Neurological: Negative.   Psychiatric/Behavioral: Negative.       Physical Exam:  Vital Signs: BP 126/74   Wt 178 lb (80.7 kg)   LMP  (LMP Unknown)   BMI 32.56 kg/m  Constitutional: Well nourished, well developed female in no acute distress.  HEENT: normal Skin: Warm and dry.  Cardiovascular: Regular rate and rhythm.   Extremity: no edema  Respiratory: Clear to auscultation bilateral. Normal respiratory effort Abdomen: FHT present and gravid, NT Back: no CVAT Neuro: DTRs 2+, Cranial nerves grossly intact Psych: Alert and Oriented x3. No memory deficits. Normal mood and affect.  MS: normal gait, normal bilateral lower extremity ROM/strength/stability.  Non-Stress Test  Baseline FHR: 145 beats/min Variability: moderate Accelerations: present Decelerations: absent Tocometry: not done  Interpretation:  INDICATIONS: history of previous fetal demise RESULTS:  A NST procedure was performed with FHR monitoring and a normal baseline established, appropriate time of 20-40 minutes of evaluation, and accels >2 seen w 15x15 characteristics.  Results show a REACTIVE NST.    Pertinent Results:  Prenatal Labs: Blood type/Rh O positive  Antibody screen negative  Rubella Immune  Varicella Not immune    RPR NR  HBsAg negative  HIV negative  GC negative  Chlamydia  negative  Genetic screening Abnormal first trim screen (also, low PAPPA), normal NIPT. Normal msAFP  1 hour GTT 88  3 hour GTT n/a  GBS N/a - given gestational age   Growth ultrasound here today. Growth scan 18th %ile, AFI 11cm  Assessment:  Maria Richmond is a 21 y.o. 813P0110 female at 4119w6d with a history of PPROM with placental abruption at 26 weeks with resultant classical cesarean section, here for delivery planning.   Plan:  1. History of preterm delivery with classical cesarean section 1. Continue 17-OHPC weekly injections 2. Scheduled c-section on 07/17/16 at 37 weeks 3. Daily bASA 4. Weekly AP testing with AFI/NST (can be accomplished here or as arranged by [facility]) 2. Low PAPPA 1. Growth ultrasound today normal with normal AFI 2. S/p negative NIPT and normal msAFP 3. History of depression: early postpartum follow up 4. History of marijuana use: positive UDS screens  1. Discussed potential fetal effects of marijuana use 2. UDS on admission for c-section 5. Fetwal well-being: reassuring today 6. I have arranged follow up here for AFI/NST for patient and [facility] convenience.  However, if [facility] wishes to accomplish this in another way, we will just see her pre-operatively.     Maria MohairStephen Naleah Kofoed, MD 07/03/2016 1:23 PM   CC: [facility]

## 2016-07-02 NOTE — Telephone Encounter (Signed)
Patient is aware of H&P on 07/15/16 @ 8:50am with Dr [name], Pre-admit Testing on 07/16/16, and OR on 07/17/16.

## 2016-07-07 ENCOUNTER — Ambulatory Visit: Payer: Medicaid Other

## 2016-07-08 ENCOUNTER — Ambulatory Visit (INDEPENDENT_AMBULATORY_CARE_PROVIDER_SITE_OTHER): Payer: Medicaid Other | Admitting: Advanced Practice Midwife

## 2016-07-08 ENCOUNTER — Ambulatory Visit (INDEPENDENT_AMBULATORY_CARE_PROVIDER_SITE_OTHER): Payer: Medicaid Other

## 2016-07-08 VITALS — BP 130/90 | Wt 177.0 lb

## 2016-07-08 DIAGNOSIS — O09213 Supervision of pregnancy with history of pre-term labor, third trimester: Secondary | ICD-10-CM | POA: Diagnosis not present

## 2016-07-08 DIAGNOSIS — Z8759 Personal history of other complications of pregnancy, childbirth and the puerperium: Secondary | ICD-10-CM | POA: Diagnosis not present

## 2016-07-08 DIAGNOSIS — Z3A35 35 weeks gestation of pregnancy: Secondary | ICD-10-CM | POA: Diagnosis not present

## 2016-07-08 DIAGNOSIS — Z3689 Encounter for other specified antenatal screening: Secondary | ICD-10-CM

## 2016-07-08 DIAGNOSIS — O09293 Supervision of pregnancy with other poor reproductive or obstetric history, third trimester: Secondary | ICD-10-CM | POA: Diagnosis not present

## 2016-07-08 NOTE — Progress Notes (Signed)
AFI/NST today  

## 2016-07-08 NOTE — Progress Notes (Signed)
AFI 10.22 cm today. NST reactive: 150 bpm baseline, moderate variability, + accelerations, a couple of variables noted. Did not collect GBS/aptima today- will need to collect at nv. No LOF, VB, Ctx's.

## 2016-07-15 ENCOUNTER — Encounter: Payer: Medicaid Other | Admitting: Obstetrics and Gynecology

## 2016-07-15 ENCOUNTER — Ambulatory Visit (INDEPENDENT_AMBULATORY_CARE_PROVIDER_SITE_OTHER): Payer: Medicaid Other | Admitting: Obstetrics and Gynecology

## 2016-07-15 VITALS — BP 124/70 | Ht 62.0 in | Wt 165.0 lb

## 2016-07-15 DIAGNOSIS — Z369 Encounter for antenatal screening, unspecified: Secondary | ICD-10-CM

## 2016-07-15 DIAGNOSIS — O280 Abnormal hematological finding on antenatal screening of mother: Secondary | ICD-10-CM

## 2016-07-15 DIAGNOSIS — O09899 Supervision of other high risk pregnancies, unspecified trimester: Secondary | ICD-10-CM

## 2016-07-15 DIAGNOSIS — Z3A36 36 weeks gestation of pregnancy: Secondary | ICD-10-CM

## 2016-07-15 DIAGNOSIS — Z8759 Personal history of other complications of pregnancy, childbirth and the puerperium: Secondary | ICD-10-CM

## 2016-07-15 DIAGNOSIS — O42919 Preterm premature rupture of membranes, unspecified as to length of time between rupture and onset of labor, unspecified trimester: Secondary | ICD-10-CM

## 2016-07-15 DIAGNOSIS — O09213 Supervision of pregnancy with history of pre-term labor, third trimester: Secondary | ICD-10-CM

## 2016-07-15 DIAGNOSIS — Z98891 History of uterine scar from previous surgery: Secondary | ICD-10-CM

## 2016-07-15 DIAGNOSIS — Z8751 Personal history of pre-term labor: Secondary | ICD-10-CM

## 2016-07-15 NOTE — Progress Notes (Signed)
  Preoperative History and Physical  Maria Richmond is a 21 y.o. G3P0110 here for her cesarean section.   No significant preoperative concerns.  History of Present Illness: 21 y.o. G3P0110 female at [redacted]w[redacted]d whose pregnancy is dated by a 13 week ultrasound.  Pregnancy has been complicated by a history of prior classical cesarean delivery at 26 weeks (PPROM with abruption, neonatal demise), bacterial vaginosis, positive marijuana screen, abnormal first trimester screening (neg NIPT) with low PAPPA, history of depression. She has been taking Makena during this pregnancy.  She had a growth ultrasound on June 26. She has been having weekly antepartum testing with AFI and NST.  Denies vaginal bleeding, leakage of fluid, contractions. She notes +FM.   Proposed surgery: repeat cesarean delivery.   Past Medical History:  Diagnosis Date  . Depression   . Medical history non-contributory   . Preterm labor    Past Surgical History:  Procedure Laterality Date  . CESAREAN SECTION     OB History  Gravida Para Term Preterm AB Living  3 1 0 1 1 0  SAB TAB Ectopic Multiple Live Births  1 0 0   1    # Outcome Date GA Lbr Len/2nd Weight Sex Delivery Anes PTL Lv  3 Current           2 Preterm 07/20/14    F CS-Classical   ND     Complications: Abruptio Placenta,Preterm premature rupture of membranes (PPROM) delivered, current hospitalization  1 SAB             Patient denies any other pertinent gynecologic issues.   Current Outpatient Prescriptions on File Prior to Visit  Medication Sig Dispense Refill  . OVER THE COUNTER MEDICATION Take 1 tablet by mouth daily. Iron tablet    . Prenatal Vit-Fe Fumarate-FA (PRENATAL MULTIVITAMIN) TABS tablet Take 1 tablet by mouth daily at 12 noon.     No current facility-administered medications on file prior to visit.    No Known Allergies  Social History:   reports that she has quit smoking. Her smoking use included Cigarettes. She smoked 1.00 pack per  day. She has never used smokeless tobacco. She reports that she does not drink alcohol or use drugs.  No family history on file.  Review of Systems: Noncontributory  PHYSICAL EXAM: Blood pressure 124/70, height 5' 2" (1.575 m), weight 165 lb (74.8 kg), unknown if currently breastfeeding. CONSTITUTIONAL: Well-developed, well-nourished female in no acute distress.  HENT:  Normocephalic, atraumatic, External right and left ear normal. Oropharynx is clear and moist EYES: Conjunctivae and EOM are normal. Pupils are equal, round, and reactive to light. No scleral icterus.  NECK: Normal range of motion, supple, no masses SKIN: Skin is warm and dry. No rash noted. Not diaphoretic. No erythema. No pallor. NEUROLGIC: Alert and oriented to person, place, and time. Normal reflexes, muscle tone coordination. No cranial nerve deficit noted. PSYCHIATRIC: Normal mood and affect. Normal behavior. Normal judgment and thought content. CARDIOVASCULAR: Normal heart rate noted, regular rhythm RESPIRATORY: Effort and breath sounds normal, no problems with respiration noted ABDOMEN: Soft, nontender, nondistended. PELVIC: Deferred MUSCULOSKELETAL: Normal range of motion. No edema and no tenderness. 2+ distal pulses. +FHR today.   Assessment: Maria Richmond is a 21 y.o. G3P0110 female at [redacted]w[redacted]d here for preoperative visit for repeat cesarean delivery with history of classical cesarean delivery in prior pregnancy.  Plan: The risks of surgery were discussed in detail with the patient including but not limited to: bleeding   which may require transfusion or reoperation; infection which may require antibiotics; injury to surrounding organs which may involve bowel, bladder, ureters ; need for additional procedures including laparoscopy or laparotomy; thromboembolic phenomenon, surgical site problems and other postoperative/anesthesia complications. Likelihood of success in alleviating the patient's condition was  discussed. Routine postoperative instructions will be reviewed with the patient and her family in detail after surgery.  The patient concurred with the proposed plan, giving informed written consent for the surgery.  Preoperative prophylactic antibiotics and SCDs ordered on call to the OR.    Luiscarlos Kaczmarczyk, MD 07/15/2016 10:22 AM    

## 2016-07-16 ENCOUNTER — Ambulatory Visit (INDEPENDENT_AMBULATORY_CARE_PROVIDER_SITE_OTHER): Payer: Medicaid Other

## 2016-07-16 ENCOUNTER — Ambulatory Visit (INDEPENDENT_AMBULATORY_CARE_PROVIDER_SITE_OTHER): Payer: Medicaid Other | Admitting: Advanced Practice Midwife

## 2016-07-16 ENCOUNTER — Encounter
Admission: RE | Admit: 2016-07-16 | Discharge: 2016-07-16 | Disposition: A | Discharge status: Home / Self Care | Payer: Medicaid Other | Source: Ambulatory Visit | Attending: Obstetrics and Gynecology | Admitting: Obstetrics and Gynecology

## 2016-07-16 VITALS — BP 130/88 | Wt 179.0 lb

## 2016-07-16 DIAGNOSIS — O0992 Supervision of high risk pregnancy, unspecified, second trimester: Secondary | ICD-10-CM

## 2016-07-16 DIAGNOSIS — O09213 Supervision of pregnancy with history of pre-term labor, third trimester: Secondary | ICD-10-CM | POA: Diagnosis not present

## 2016-07-16 DIAGNOSIS — Z3A37 37 weeks gestation of pregnancy: Secondary | ICD-10-CM

## 2016-07-16 DIAGNOSIS — Z3689 Encounter for other specified antenatal screening: Secondary | ICD-10-CM

## 2016-07-16 LAB — TYPE AND SCREEN
ABO/RH(D): O POS
ANTIBODY SCREEN: NEGATIVE
EXTEND SAMPLE REASON: UNDETERMINED

## 2016-07-16 LAB — CBC
HCT: 34.5 % — ABNORMAL LOW (ref 35.0–47.0)
HEMOGLOBIN: 11.5 g/dL — AB (ref 12.0–16.0)
MCH: 28.8 pg (ref 26.0–34.0)
MCHC: 33.4 g/dL (ref 32.0–36.0)
MCV: 86.3 fL (ref 80.0–100.0)
PLATELETS: 149 10*3/uL — AB (ref 150–440)
RBC: 4 MIL/uL (ref 3.80–5.20)
RDW: 13.6 % (ref 11.5–14.5)
WBC: 6.6 10*3/uL (ref 3.6–11.0)

## 2016-07-16 LAB — RAPID HIV SCREEN (HIV 1/2 AB+AG)
HIV 1/2 Antibodies: NONREACTIVE
HIV-1 P24 Antigen - HIV24: NONREACTIVE

## 2016-07-16 MED ORDER — CEFAZOLIN SODIUM-DEXTROSE 2-4 GM/100ML-% IV SOLN
2.0000 g | INTRAVENOUS | Status: AC
  Administered 2016-07-17: 2 g via INTRAVENOUS
  Filled 2016-07-16: qty 100

## 2016-07-16 NOTE — Patient Instructions (Addendum)
Your procedure is scheduled on: Thursday, July 17, 2016 Report to the Birth Place on the 3rd floor at 5:30 am; go through the emergency room doors as the other doors would be locked.   REMEMBER: Instructions that are not followed completely may result in serious medical risk up to and including death; or upon the discretion of your surgeon and anesthesiologist your surgery may need to be rescheduled.  Do not eat food or drink liquids after midnight. No gum chewing or hard candies  No Alcohol for 24 hours before or after surgery.  No Smoking for 24 hours prior to surgery.  Notify your doctor if there is any change in your medical condition (cold, fever, infection).  Do not wear jewelry, make-up, hairpins, clips or nail polish.  Do not wear lotions, powders, or perfumes.   Do not shave 48 hours prior to surgery.  Contacts and dentures may not be worn into surgery.  Do not bring valuables to the hospital. [facility] is not responsible for any belongings or valuables.  TAKE THESE MEDICATIONS THE MORNING OF SURGERY WITH A SIP OF WATER:  NONE  Use CHG Soap or wipes as directed on instruction sheet (AVOID BREASTS AND NIPPLES)  Stop Anti-inflammatories such as Advil, Aleve, Ibuprofen, Motrin, Naproxen, Naprosyn, Goodie powder, or aspirin products. (May take Tylenol or Acetaminophen and Celebrex if needed.)  Stop supplements until after surgery.   If you are being discharged the day of surgery, you will not be allowed to drive home. You will need someone to drive you home and stay with you that night.   If you are taking public transportation, you will need to have a responsible adult to with you.

## 2016-07-16 NOTE — Progress Notes (Signed)
AFI: 7.68 cm today NST reactive: 130 bpm baseline, moderate variability, + accelerations, - decelerations Patient questions answered   *EPIC was down today prior to this visit so note to do GBS was not seen/remembered. Will need GBS collected at time of c/s for status if needed.*

## 2016-07-16 NOTE — Progress Notes (Signed)
Pt c/o numbness in feet. No vb. No lof. Afi/nst today

## 2016-07-17 ENCOUNTER — Inpatient Hospital Stay: Payer: Medicaid Other | Admitting: Anesthesiology

## 2016-07-17 ENCOUNTER — Inpatient Hospital Stay
Admission: RE | Admit: 2016-07-17 | Discharge: 2016-07-19 | DRG: 765 | Disposition: A | Discharge status: Home / Self Care | Payer: Medicaid Other | Source: Ambulatory Visit | Attending: Obstetrics and Gynecology | Admitting: Obstetrics and Gynecology

## 2016-07-17 ENCOUNTER — Encounter
Admission: RE | Disposition: A | Discharge status: Home / Self Care | Payer: Self-pay | Source: Ambulatory Visit | Attending: Obstetrics and Gynecology

## 2016-07-17 DIAGNOSIS — Z87891 Personal history of nicotine dependence: Secondary | ICD-10-CM | POA: Diagnosis not present

## 2016-07-17 DIAGNOSIS — O9081 Anemia of the puerperium: Secondary | ICD-10-CM | POA: Diagnosis not present

## 2016-07-17 DIAGNOSIS — O280 Abnormal hematological finding on antenatal screening of mother: Secondary | ICD-10-CM

## 2016-07-17 DIAGNOSIS — Z98891 History of uterine scar from previous surgery: Secondary | ICD-10-CM

## 2016-07-17 DIAGNOSIS — O09899 Supervision of other high risk pregnancies, unspecified trimester: Secondary | ICD-10-CM

## 2016-07-17 DIAGNOSIS — O34211 Maternal care for low transverse scar from previous cesarean delivery: Principal | ICD-10-CM | POA: Diagnosis present

## 2016-07-17 DIAGNOSIS — O09213 Supervision of pregnancy with history of pre-term labor, third trimester: Secondary | ICD-10-CM

## 2016-07-17 DIAGNOSIS — Z3A36 36 weeks gestation of pregnancy: Secondary | ICD-10-CM

## 2016-07-17 DIAGNOSIS — D62 Acute posthemorrhagic anemia: Secondary | ICD-10-CM | POA: Diagnosis not present

## 2016-07-17 DIAGNOSIS — Z8759 Personal history of other complications of pregnancy, childbirth and the puerperium: Secondary | ICD-10-CM

## 2016-07-17 LAB — URINE DRUG SCREEN, QUALITATIVE (ARMC ONLY)
Amphetamines, Ur Screen: NOT DETECTED
BARBITURATES, UR SCREEN: NOT DETECTED
Benzodiazepine, Ur Scrn: NOT DETECTED
CANNABINOID 50 NG, UR ~~LOC~~: NOT DETECTED
Cocaine Metabolite,Ur ~~LOC~~: NOT DETECTED
MDMA (ECSTASY) UR SCREEN: NOT DETECTED
Methadone Scn, Ur: NOT DETECTED
Opiate, Ur Screen: NOT DETECTED
PHENCYCLIDINE (PCP) UR S: NOT DETECTED
Tricyclic, Ur Screen: NOT DETECTED

## 2016-07-17 LAB — RPR: RPR: NONREACTIVE

## 2016-07-17 SURGERY — Surgical Case
Anesthesia: Spinal

## 2016-07-17 MED ORDER — OXYCODONE-ACETAMINOPHEN 5-325 MG PO TABS
2.0000 | ORAL_TABLET | ORAL | Status: DC | PRN
  Filled 2016-07-17: qty 2

## 2016-07-17 MED ORDER — NALBUPHINE HCL 10 MG/ML IJ SOLN
5.0000 mg | INTRAMUSCULAR | Status: DC | PRN
  Administered 2016-07-17: 5 mg via INTRAVENOUS

## 2016-07-17 MED ORDER — LACTATED RINGERS IV SOLN
INTRAVENOUS | Status: DC
  Administered 2016-07-17: 08:00:00 via INTRAVENOUS

## 2016-07-17 MED ORDER — SIMETHICONE 80 MG PO CHEW
80.0000 mg | CHEWABLE_TABLET | Freq: Three times a day (TID) | ORAL | Status: DC
  Administered 2016-07-17 – 2016-07-18 (×5): 80 mg via ORAL
  Filled 2016-07-17 (×6): qty 1

## 2016-07-17 MED ORDER — SENNOSIDES-DOCUSATE SODIUM 8.6-50 MG PO TABS
2.0000 | ORAL_TABLET | ORAL | Status: DC
  Administered 2016-07-17: 2 via ORAL
  Filled 2016-07-17: qty 2

## 2016-07-17 MED ORDER — MORPHINE SULFATE (PF) 0.5 MG/ML IJ SOLN
INTRAMUSCULAR | Status: AC
  Filled 2016-07-17: qty 10

## 2016-07-17 MED ORDER — OXYTOCIN 40 UNITS IN LACTATED RINGERS INFUSION - SIMPLE MED
2.5000 [IU]/h | INTRAVENOUS | Status: DC
  Administered 2016-07-17: 2.5 [IU]/h via INTRAVENOUS

## 2016-07-17 MED ORDER — COCONUT OIL OIL
1.0000 | TOPICAL_OIL | Status: DC | PRN
Start: 2016-07-17 — End: 2016-07-19

## 2016-07-17 MED ORDER — NALBUPHINE HCL 10 MG/ML IJ SOLN: 5.0000 mg | Freq: Once | INTRAMUSCULAR | Status: DC | PRN

## 2016-07-17 MED ORDER — VARICELLA VIRUS VACCINE LIVE 1350 PFU/0.5ML IJ SUSR: 0.5000 mL | INTRAMUSCULAR | Status: DC | PRN

## 2016-07-17 MED ORDER — BUPIVACAINE 0.25 % ON-Q PUMP DUAL CATH 400 ML
400.0000 mL | INJECTION | Status: DC
Start: 2016-07-17 — End: 2016-07-17
  Filled 2016-07-17: qty 400

## 2016-07-17 MED ORDER — MEPERIDINE HCL 25 MG/ML IJ SOLN
6.2500 mg | INTRAMUSCULAR | Status: DC | PRN
Start: 2016-07-17 — End: 2016-07-17

## 2016-07-17 MED ORDER — SOD CITRATE-CITRIC ACID 500-334 MG/5ML PO SOLN
30.0000 mL | ORAL | Status: AC
  Administered 2016-07-17: 30 mL via ORAL
  Filled 2016-07-17: qty 15

## 2016-07-17 MED ORDER — PHENYLEPHRINE HCL 10 MG/ML IJ SOLN
INTRAMUSCULAR | Status: DC | PRN
  Administered 2016-07-17 (×7): 100 ug via INTRAVENOUS

## 2016-07-17 MED ORDER — BUPIVACAINE IN DEXTROSE 0.75-8.25 % IT SOLN
INTRATHECAL | Status: AC
  Filled 2016-07-17: qty 2

## 2016-07-17 MED ORDER — ONDANSETRON HCL 4 MG/2ML IJ SOLN
INTRAMUSCULAR | Status: DC | PRN
  Administered 2016-07-17: 4 mg via INTRAVENOUS

## 2016-07-17 MED ORDER — OXYCODONE-ACETAMINOPHEN 5-325 MG PO TABS
1.0000 | ORAL_TABLET | ORAL | Status: DC | PRN
Start: 2016-07-18 — End: 2016-07-19
  Administered 2016-07-18 (×2): 1 via ORAL
  Filled 2016-07-17 (×2): qty 1

## 2016-07-17 MED ORDER — FERROUS SULFATE 325 (65 FE) MG PO TABS
325.0000 mg | ORAL_TABLET | Freq: Two times a day (BID) | ORAL | Status: DC
  Administered 2016-07-17 – 2016-07-19 (×4): 325 mg via ORAL
  Filled 2016-07-17 (×4): qty 1

## 2016-07-17 MED ORDER — OXYTOCIN 40 UNITS IN LACTATED RINGERS INFUSION - SIMPLE MED
INTRAVENOUS | Status: DC | PRN
  Administered 2016-07-17: 1000 mL via INTRAVENOUS

## 2016-07-17 MED ORDER — ONDANSETRON HCL 4 MG/2ML IJ SOLN
INTRAMUSCULAR | Status: AC
  Filled 2016-07-17: qty 2

## 2016-07-17 MED ORDER — NALBUPHINE HCL 10 MG/ML IJ SOLN: 5.0000 mg | INTRAMUSCULAR | Status: DC | PRN

## 2016-07-17 MED ORDER — BUPIVACAINE HCL (PF) 0.5 % IJ SOLN
5.0000 mL | Freq: Once | INTRAMUSCULAR | Status: DC
  Filled 2016-07-17: qty 30

## 2016-07-17 MED ORDER — BUPIVACAINE HCL (PF) 0.5 % IJ SOLN: 5.0000 mL | Freq: Once | INTRAMUSCULAR | Status: DC

## 2016-07-17 MED ORDER — PHENYLEPHRINE 40 MCG/ML (10ML) SYRINGE FOR IV PUSH (FOR BLOOD PRESSURE SUPPORT): PREFILLED_SYRINGE | INTRAVENOUS | Status: DC | PRN

## 2016-07-17 MED ORDER — DIPHENHYDRAMINE HCL 25 MG PO CAPS
25.0000 mg | ORAL_CAPSULE | Freq: Four times a day (QID) | ORAL | Status: DC | PRN
  Administered 2016-07-17: 25 mg via ORAL
  Filled 2016-07-17: qty 1

## 2016-07-17 MED ORDER — PRENATAL MULTIVITAMIN CH
1.0000 | ORAL_TABLET | Freq: Every day | ORAL | Status: DC
  Filled 2016-07-17: qty 1

## 2016-07-17 MED ORDER — DEXAMETHASONE SODIUM PHOSPHATE 10 MG/ML IJ SOLN
INTRAMUSCULAR | Status: AC
  Filled 2016-07-17: qty 1

## 2016-07-17 MED ORDER — CEFAZOLIN SODIUM-DEXTROSE 2-3 GM-% IV SOLR
INTRAVENOUS | Status: DC | PRN
  Administered 2016-07-17: 2 g via INTRAVENOUS

## 2016-07-17 MED ORDER — NALOXONE HCL 0.4 MG/ML IJ SOLN: 0.4000 mg | INTRAMUSCULAR | Status: DC | PRN

## 2016-07-17 MED ORDER — FENTANYL CITRATE (PF) 100 MCG/2ML IJ SOLN
INTRAMUSCULAR | Status: DC | PRN
  Administered 2016-07-17: 15 ug via INTRATHECAL

## 2016-07-17 MED ORDER — IBUPROFEN 600 MG PO TABS
600.0000 mg | ORAL_TABLET | Freq: Four times a day (QID) | ORAL | Status: DC
  Administered 2016-07-17 – 2016-07-18 (×5): 600 mg via ORAL
  Filled 2016-07-17 (×7): qty 1

## 2016-07-17 MED ORDER — DEXAMETHASONE SODIUM PHOSPHATE 10 MG/ML IJ SOLN
INTRAMUSCULAR | Status: DC | PRN
  Administered 2016-07-17: 10 mg via INTRAVENOUS

## 2016-07-17 MED ORDER — DIBUCAINE 1 % RE OINT: 1.0000 "application " | TOPICAL_OINTMENT | RECTAL | Status: DC | PRN

## 2016-07-17 MED ORDER — OXYTOCIN 40 UNITS IN LACTATED RINGERS INFUSION - SIMPLE MED
INTRAVENOUS | Status: AC
  Filled 2016-07-17: qty 1000

## 2016-07-17 MED ORDER — LACTATED RINGERS IV SOLN
INTRAVENOUS | Status: DC
  Administered 2016-07-17 (×2): via INTRAVENOUS

## 2016-07-17 MED ORDER — LACTATED RINGERS IV SOLN
Freq: Once | INTRAVENOUS | Status: AC
  Administered 2016-07-17: 07:00:00 via INTRAVENOUS

## 2016-07-17 MED ORDER — DEXTROSE 5 % IV SOLN
1.0000 ug/kg/h | INTRAVENOUS | Status: DC | PRN
  Filled 2016-07-17: qty 2

## 2016-07-17 MED ORDER — ONDANSETRON HCL 4 MG/2ML IJ SOLN
4.0000 mg | Freq: Three times a day (TID) | INTRAMUSCULAR | Status: DC | PRN
  Administered 2016-07-17 (×2): 4 mg via INTRAVENOUS
  Filled 2016-07-17: qty 2

## 2016-07-17 MED ORDER — FENTANYL CITRATE (PF) 100 MCG/2ML IJ SOLN
INTRAMUSCULAR | Status: AC
  Filled 2016-07-17: qty 2

## 2016-07-17 MED ORDER — MIDAZOLAM HCL 2 MG/2ML IJ SOLN
INTRAMUSCULAR | Status: AC
  Filled 2016-07-17: qty 2

## 2016-07-17 MED ORDER — BUPIVACAINE HCL 0.5 % IJ SOLN
INTRAMUSCULAR | Status: DC | PRN
  Administered 2016-07-17: 10 mL

## 2016-07-17 MED ORDER — BUPIVACAINE IN DEXTROSE 0.75-8.25 % IT SOLN
INTRATHECAL | Status: DC | PRN
  Administered 2016-07-17: 1.6 mL via INTRATHECAL

## 2016-07-17 MED ORDER — MORPHINE SULFATE (PF) 0.5 MG/ML IJ SOLN
INTRAMUSCULAR | Status: DC | PRN
  Administered 2016-07-17: .1 mg via INTRATHECAL

## 2016-07-17 MED ORDER — SODIUM CHLORIDE 0.9% FLUSH: 3.0000 mL | INTRAVENOUS | Status: DC | PRN

## 2016-07-17 MED ORDER — MENTHOL 3 MG MT LOZG
1.0000 | LOZENGE | OROMUCOSAL | Status: DC | PRN
  Filled 2016-07-17: qty 9

## 2016-07-17 MED ORDER — NALBUPHINE HCL 10 MG/ML IJ SOLN
INTRAMUSCULAR | Status: AC
  Filled 2016-07-17: qty 1

## 2016-07-17 MED ORDER — WITCH HAZEL-GLYCERIN EX PADS: 1.0000 "application " | MEDICATED_PAD | CUTANEOUS | Status: DC | PRN

## 2016-07-17 SURGICAL SUPPLY — 34 items
CANISTER SUCT 3000ML PPV (MISCELLANEOUS) ×3 IMPLANT
CATH KIT ON-Q SILVERSOAK 5IN (CATHETERS) ×6 IMPLANT
CLOSURE WOUND 1/2 X4 (GAUZE/BANDAGES/DRESSINGS) ×1
DERMABOND ADVANCED (GAUZE/BANDAGES/DRESSINGS) ×4
DERMABOND ADVANCED .7 DNX12 (GAUZE/BANDAGES/DRESSINGS) ×2 IMPLANT
DRSG OPSITE POSTOP 4X10 (GAUZE/BANDAGES/DRESSINGS) ×3 IMPLANT
DRSG TELFA 3X8 NADH (GAUZE/BANDAGES/DRESSINGS) ×6 IMPLANT
ELECT CAUTERY BLADE 6.4 (BLADE) ×3 IMPLANT
ELECT REM PT RETURN 9FT ADLT (ELECTROSURGICAL) ×3
ELECTRODE REM PT RTRN 9FT ADLT (ELECTROSURGICAL) ×1 IMPLANT
GAUZE SPONGE 4X4 12PLY STRL (GAUZE/BANDAGES/DRESSINGS) ×6 IMPLANT
GLOVE BIO SURGEON STRL SZ7 (GLOVE) ×3 IMPLANT
GLOVE INDICATOR 7.5 STRL GRN (GLOVE) ×3 IMPLANT
GOWN STRL REUS W/ TWL LRG LVL3 (GOWN DISPOSABLE) ×3 IMPLANT
GOWN STRL REUS W/TWL LRG LVL3 (GOWN DISPOSABLE) ×6
NS IRRIG 1000ML POUR BTL (IV SOLUTION) ×3 IMPLANT
PACK C SECTION AR (MISCELLANEOUS) ×3 IMPLANT
PAD ABD DERMACEA PRESS 5X9 (GAUZE/BANDAGES/DRESSINGS) ×3 IMPLANT
PAD OB MATERNITY 4.3X12.25 (PERSONAL CARE ITEMS) ×6 IMPLANT
PAD PREP 24X41 OB/GYN DISP (PERSONAL CARE ITEMS) ×3 IMPLANT
SPONGE LAP 18X18 5 PK (GAUZE/BANDAGES/DRESSINGS) IMPLANT
STRIP CLOSURE SKIN 1/2X4 (GAUZE/BANDAGES/DRESSINGS) ×2 IMPLANT
SUT CHROMIC GUT BROWN 0 54 (SUTURE) IMPLANT
SUT CHROMIC GUT BROWN 0 54IN (SUTURE)
SUT MNCRL 4-0 (SUTURE) ×2
SUT MNCRL 4-0 27XMFL (SUTURE) ×1
SUT PDS AB 1 TP1 96 (SUTURE) ×3 IMPLANT
SUT PLAIN GUT 0 (SUTURE) IMPLANT
SUT VIC AB 0 CTX 36 (SUTURE) ×4
SUT VIC AB 0 CTX36XBRD ANBCTRL (SUTURE) ×2 IMPLANT
SUT VIC AB 3-0 SH 27 (SUTURE) ×2
SUT VIC AB 3-0 SH 27X BRD (SUTURE) ×1 IMPLANT
SUTURE MNCRL 4-0 27XMF (SUTURE) ×1 IMPLANT
SWABSTK COMLB BENZOIN TINCTURE (MISCELLANEOUS) ×3 IMPLANT

## 2016-07-17 NOTE — Anesthesia Post-op Follow-up Note (Cosign Needed)
Anesthesia QCDR form completed.        

## 2016-07-17 NOTE — H&P (View-Only) (Signed)
Preoperative History and Physical  Maria Richmond is a 21 y.o. G3P0110 here for her cesarean section.   No significant preoperative concerns.  History of Present Illness: 21 y.o. G35P0110 female at [redacted]w[redacted]d whose pregnancy is dated by a 13 week ultrasound.  Pregnancy has been complicated by a history of prior classical cesarean delivery at 26 weeks (PPROM with abruption, neonatal demise), bacterial vaginosis, positive marijuana screen, abnormal first trimester screening (neg NIPT) with low PAPPA, history of depression. She has been taking Makena during this pregnancy.  She had a growth ultrasound on June 26. She has been having weekly antepartum testing with AFI and NST.  Denies vaginal bleeding, leakage of fluid, contractions. She notes +FM.   Proposed surgery: repeat cesarean delivery.   Past Medical History:  Diagnosis Date  . Depression   . Medical history non-contributory   . Preterm labor    Past Surgical History:  Procedure Laterality Date  . CESAREAN SECTION     OB History  Gravida Para Term Preterm AB Living  3 1 0 1 1 0  SAB TAB Ectopic Multiple Live Births  1 0 0   1    # Outcome Date GA Lbr Len/2nd Weight Sex Delivery Anes PTL Lv  3 Current           2 Preterm 07/20/14    F CS-Classical   ND     Complications: Abruptio Placenta,Preterm premature rupture of membranes (PPROM) delivered, current hospitalization  1 SAB             Patient denies any other pertinent gynecologic issues.   Current Outpatient Prescriptions on File Prior to Visit  Medication Sig Dispense Refill  . OVER THE COUNTER MEDICATION Take 1 tablet by mouth daily. Iron tablet    . Prenatal Vit-Fe Fumarate-FA (PRENATAL MULTIVITAMIN) TABS tablet Take 1 tablet by mouth daily at 12 noon.     No current facility-administered medications on file prior to visit.    No Known Allergies  Social History:   reports that she has quit smoking. Her smoking use included Cigarettes. She smoked 1.00 pack per  day. She has never used smokeless tobacco. She reports that she does not drink alcohol or use drugs.  No family history on file.  Review of Systems: Noncontributory  PHYSICAL EXAM: Blood pressure 124/70, height 5\' 2"  (1.575 m), weight 165 lb (74.8 kg), unknown if currently breastfeeding. CONSTITUTIONAL: Well-developed, well-nourished female in no acute distress.  HENT:  Normocephalic, atraumatic, External right and left ear normal. Oropharynx is clear and moist EYES: Conjunctivae and EOM are normal. Pupils are equal, round, and reactive to light. No scleral icterus.  NECK: Normal range of motion, supple, no masses SKIN: Skin is warm and dry. No rash noted. Not diaphoretic. No erythema. No pallor. NEUROLGIC: Alert and oriented to person, place, and time. Normal reflexes, muscle tone coordination. No cranial nerve deficit noted. PSYCHIATRIC: Normal mood and affect. Normal behavior. Normal judgment and thought content. CARDIOVASCULAR: Normal heart rate noted, regular rhythm RESPIRATORY: Effort and breath sounds normal, no problems with respiration noted ABDOMEN: Soft, nontender, nondistended. PELVIC: Deferred MUSCULOSKELETAL: Normal range of motion. No edema and no tenderness. 2+ distal pulses. +FHR today.   Assessment: Maria Richmond is a 21 y.o. G51P0110 female at [redacted]w[redacted]d here for preoperative visit for repeat cesarean delivery with history of classical cesarean delivery in prior pregnancy.  Plan: The risks of surgery were discussed in detail with the patient including but not limited to: bleeding  which may require transfusion or reoperation; infection which may require antibiotics; injury to surrounding organs which may involve bowel, bladder, ureters ; need for additional procedures including laparoscopy or laparotomy; thromboembolic phenomenon, surgical site problems and other postoperative/anesthesia complications. Likelihood of success in alleviating the patient's condition was  discussed. Routine postoperative instructions will be reviewed with the patient and her family in detail after surgery.  The patient concurred with the proposed plan, giving informed written consent for the surgery.  Preoperative prophylactic antibiotics and SCDs ordered on call to the OR.    Thomasene MohairStephen Elfa Wooton, MD 07/15/2016 10:22 AM

## 2016-07-17 NOTE — Anesthesia Preprocedure Evaluation (Signed)
Anesthesia Evaluation  Patient identified by MRN, date of birth, ID band Patient awake    Reviewed: Allergy & Precautions, H&P , NPO status , Patient's Chart, lab work & pertinent test results, reviewed documented beta blocker date and time   History of Anesthesia Complications Negative for: history of anesthetic complications  Airway Mallampati: II  TM Distance: >3 FB Neck ROM: full    Dental  (+) Dental Advidsory Given, Teeth Intact   Pulmonary neg pulmonary ROS, former smoker,           Cardiovascular Exercise Tolerance: Good negative cardio ROS       Neuro/Psych PSYCHIATRIC DISORDERS (Depression) negative neurological ROS     GI/Hepatic Neg liver ROS, GERD  Controlled,  Endo/Other  negative endocrine ROS  Renal/GU negative Renal ROS  negative genitourinary   Musculoskeletal   Abdominal   Peds  Hematology negative hematology ROS (+)   Anesthesia Other Findings Past Medical History: No date: Depression No date: Medical history non-contributory No date: Preterm labor   Reproductive/Obstetrics (+) Pregnancy                             Anesthesia Physical Anesthesia Plan  ASA: II  Anesthesia Plan: General   Post-op Pain Management:    Induction:   PONV Risk Score and Plan: 2 and Ondansetron and Dexamethasone  Airway Management Planned:   Additional Equipment:   Intra-op Plan:   Post-operative Plan:   Informed Consent: I have reviewed the patients History and Physical, chart, labs and discussed the procedure including the risks, benefits and alternatives for the proposed anesthesia with the patient or authorized representative who has indicated his/her understanding and acceptance.   Dental Advisory Given  Plan Discussed with: Anesthesiologist, CRNA and Surgeon  Anesthesia Plan Comments:         Anesthesia Quick Evaluation

## 2016-07-17 NOTE — Lactation Note (Signed)
This note was copied from a baby's chart. Lactation Consultation Note  Patient Name: Maria Delila Pereyrafiniti Mings ONGEX'BToday's Date: 07/17/2016 Reason for consult: Initial assessment   Maternal Data Has patient been taught Hand Expression?: Yes  Feeding Feeding Type: Breast Fed Length of feed: 10 min (infant sleepy; baby not interested in feeding at this time)  [facility] Latch: Repeated attempts needed to sustain latch, nipple held in mouth throughout feeding, stimulation needed to elicit sucking reflex. Intervention(s): Adjust position;Assist with latch  Audible Swallowing: A few with stimulation Intervention(s): Skin to skin;Hand expression (able to express colostrum in babys mouth)  Type of Nipple: Everted at rest and after stimulation  Comfort (Breast/Nipple): Soft / non-tender     Hold (Positioning): Assistance needed to correctly position infant at breast and maintain latch. Intervention(s): Breastfeeding basics reviewed;Support Pillows;Position options  LATCH Score: 7  Lactation Tools Discussed/Used     Consult Status Consult Status: Follow-up Follow-up type: In-patient    Trudee GripCarolyn P Ahsley Attwood 07/17/2016, 4:36 PM

## 2016-07-17 NOTE — Interval H&P Note (Signed)
History and Physical Interval Note:  07/17/2016 6:33 AM  Maria Richmond  has presented today for surgery, with the diagnosis of history of cesarean  The various methods of treatment have been discussed with the patient and family. After consideration of risks, benefits and other options for treatment, the patient has consented to  Procedure(s): CESAREAN SECTION (N/A) as a surgical intervention .  The patient's history has been reviewed, patient examined, no change in status, stable for surgery.  I have reviewed the patient's chart and labs.  Questions were answered to the patient's satisfaction.    Thomasene MohairStephen Averi Cacioppo, MD 07/17/2016 6:33 AM

## 2016-07-17 NOTE — Discharge Summary (Signed)
OB Discharge Summary     Patient Name: Maria Richmond DOB: [birth year] MRN: [id number]  Date of admission: 07/17/2016 Delivering MD: Thomasene Mohair, MD  Date of Delivery: 07/17/2016  Date of discharge: 07/19/2016  Admitting diagnosis:  1) intrauterine pregnancy at [redacted]w[redacted]d 2) history of classical cesarean section  Intrauterine pregnancy: [redacted]w[redacted]d      Secondary diagnosis: Anemia     Discharge diagnosis: Term Pregnancy Delivered                                                                                                Post partum procedures:varivax  Augmentation: n/a  Complications: None  Hospital course:  Sceduled C/S   21 y.o. yo [id number] at [redacted]w[redacted]d was admitted to the hospital 07/17/2016 for scheduled cesarean section with the following indication:Elective Repeat and history of classical cesarean section.  Membrane Rupture Time/Date:   ,    Patient delivered a Viable infant.07/17/2016  Details of operation can be found in separate operative note.  Pateint had an uncomplicated postpartum course.  She is ambulating, tolerating a regular diet, passing flatus, and urinating well. Patient is discharged home in stable condition on  07/19/16         Physical exam  Vitals:   07/18/16 1938 07/18/16 2307 07/19/16 0300 07/19/16 0811  BP: (!) 143/85 128/79  131/77  Pulse: (!) 102 100  93  Resp: 18 18  17   Temp: 98.1 F (36.7 C) 98.3 F (36.8 C) 98.3 F (36.8 C) 98.4 F (36.9 C)  TempSrc: Oral Oral Oral Oral  SpO2: 100% 99%  100%  Weight:      Height:       General: alert Lochia: appropriate Uterine Fundus: firm Incision: Healing well with no significant drainage DVT Evaluation: No evidence of DVT seen on physical exam.  Labs: Lab Results  Component Value Date   WBC 12.2 (H) 07/18/2016   HGB 8.8 (L) 07/18/2016   HCT 25.7 (L) 07/18/2016   MCV 85.2 07/18/2016   PLT 144 (L) 07/18/2016    Discharge instruction: per After Visit Summary.  Medications:  Allergies as of  07/19/2016   No Known Allergies     Medication List    TAKE these medications   ibuprofen 600 MG tablet Commonly known as:  ADVIL,MOTRIN Take 1 tablet (600 mg total) by mouth every 6 (six) hours as needed for mild pain or moderate pain.   OVER THE COUNTER MEDICATION Take 65 mg by mouth daily. Iron tablet   oxyCODONE-acetaminophen 5-325 MG tablet Commonly known as:  PERCOCET/ROXICET Take 1 tablet by mouth every 4 (four) hours as needed for moderate pain or severe pain.   prenatal multivitamin Tabs tablet Take 1 tablet by mouth daily at 12 noon.       Diet: routine diet  Activity: Advance as tolerated. Pelvic rest for 6 weeks.   Outpatient follow up: Follow-up Information    Conard Novak, MD Follow up in 1 week(s).   Specialty:  Obstetrics and Gynecology Why:  postop incision check Contact information: [address] [place] [address] [phone]  Postpartum contraception: Not Discussed Rhogam Given postpartum: no Rubella vaccine given postpartum: no Varicella vaccine given postpartum: yes TDaP given antepartum or postpartum: 05/13/16  Newborn Data: Live born female  Birth Weight: 6 lb 6 oz (2892 g) APGAR: 9, 9   Baby Feeding: Breast  Disposition:home with mother  SIGNED: Vena Austria, MD

## 2016-07-17 NOTE — Anesthesia Procedure Notes (Signed)
Date/Time: 07/17/2016 7:45 AM Performed by: Henrietta HooverPOPE, Chontel Warning Pre-anesthesia Checklist: Patient identified, Emergency Drugs available, Suction available, Patient being monitored and Timeout performed Patient Re-evaluated:Patient Re-evaluated prior to induction Oxygen Delivery Method: Nasal cannula Placement Confirmation: positive ETCO2

## 2016-07-17 NOTE — Anesthesia Procedure Notes (Addendum)
Spinal  Patient location during procedure: OR Start time: 07/17/2016 7:55 AM End time: 07/17/2016 8:12 AM Staffing Anesthesiologist: Martha Clan Resident/CRNA: Arrie Borrelli Performed: resident/CRNA and anesthesiologist  Preanesthetic Checklist Completed: patient identified, site marked, surgical consent, pre-op evaluation, timeout performed, IV checked, risks and benefits discussed and monitors and equipment checked Spinal Block Patient position: sitting Prep: ChloraPrep Patient monitoring: heart rate, continuous pulse ox, blood pressure and cardiac monitor Approach: midline Location: L3-4 Injection technique: single-shot Needle Needle type: Whitacre and Introducer  Needle gauge: 25 G Needle length: 9 cm Assessment Sensory level: T10 Additional Notes Negative paresthesia. Negative blood return. Positive free-flowing CSF. Expiration date of kit checked and confirmed. Patient tolerated procedure well, without complications.

## 2016-07-17 NOTE — Op Note (Signed)
Cesarean Section Operative Note    Maria Richmond   07/17/2016   Pre-operative Diagnosis:  1) intrauterine pregnancy at 4065w1d  2) history of classical cesarean section  Post-operative Diagnosis:  1) intrauterine pregnancy at 9265w1d  2) history of classical cesarean section  Procedure: Repeat low transverse cesarean section via pfannenstiel skin incision with double layer uterine closure  Surgeon: Surgeon(s) and Role:    Conard Novak* Jackson, Stephen D, MD - Primary    * Nadara MustardHarris, Robert P, MD - Assisting   Anesthesia: spinal   Findings:  1) normal appearing gravid uterus, fallopian tubes, and ovaries, with mild adhesions of omentum to anterior uterus 2) viable female infant with APGARs of 9 and 9 with a weight of 2,892 grams   Estimated Blood Loss: 750 mL  Total IV Fluids: 1,200 ml crystalloid  Urine Output: 200 mL clear urine  Specimens: None  Complications: no complications  Disposition: PACU - hemodynamically stable.   Maternal Condition: stable   Baby condition / location:  Couplet care / Skin to Skin  Procedure Details:  The patient was seen in the Holding Room. The risks, benefits, complications, treatment options, and expected outcomes were discussed with the patient. The patient concurred with the proposed plan, giving informed consent. identified as Maria Richmond and the procedure verified as C-Section Delivery. A Time Out was held and the above information confirmed.   After induction of anesthesia, the patient was draped and prepped in the usual sterile manner. A Pfannenstiel incision was made and carried down through the subcutaneous tissue to the fascia. Fascial incision was made and extended transversely. The fascia was separated from the underlying rectus tissue superiorly and inferiorly. The peritoneum was identified and entered. Peritoneal incision was extended longitudinally. The bladder flap was sharply freed from the lower uterine segment. A low transverse  uterine incision was made and the hysterotomy was extended with cranial-caudal tension. Delivered from cephalic presentation was a 2,892 gram Living newborn infant(s) or Female with Apgar scores of 9 at one minute and 9 at five minutes. Cord ph was not sent the umbilical cord was clamped and cut cord blood was obtained for evaluation. The placenta was removed Intact and appeared normal. The uterine outline, tubes and ovaries appeared normal. The uterine incision was closed with running locked sutures of 0 Vicryl.  A second layer of the same suture was thrown in an imbricating fashion.  Hemostasis was assured.  The uterus was returned to the abdomen and the paracolic gutters were cleared of all clots and debris.  The rectus muscles were inspected and found to be hemostatic.  The On-Q catheter pumps were inserted in accordance with the manufacturer's recommendations.  The catheters were inserted approximately 4cm cephelad to the incision line, approximately 1cm apart, straddling the midline.  They were inserted to a depth of the 4th mark. They were positioned superficial to the rectus abdominus muscles and deep to the rectus fascia.    The fascia was then reapproximated with running sutures of 1-0 PDS, looped. There interrupted 3-0 vicryl stitches were thrown along the subcutaneous tissue to provide support to the skin closure. The subcuticular closure was performed using 4-0 monocryl. The skin closure was reinforced using surgical skin glue.  The On-Q catheters were bolused with 5 mL of 0.5% marcaine plain for a total of 10 mL.  The catheters were affixed to the skin with surgical skin glue, steri-strips, and tegaderm.    Instrument, sponge, and needle counts were correct prior the  abdominal closure and were correct at the conclusion of the case.  The patient received Ancef 2 gram IV prior to skin incision (within 30 minutes). For VTE prophylaxis she was wearing SCDs throughout the case.  Signed: Conard Novak, MD 07/17/2016 9:26 AM

## 2016-07-17 NOTE — Transfer of Care (Signed)
Immediate Anesthesia Transfer of Care Note  Patient: Maria Richmond  Procedure(s) Performed: Procedure(s): CESAREAN SECTION, Female, 6lb, 6oz (N/A)  Patient Location: PACU  Anesthesia Type:Spinal  Level of Consciousness: awake, alert  and oriented  Airway & Oxygen Therapy: Patient Spontanous Breathing  Post-op Assessment: Report given to RN and Post -op Vital signs reviewed and stable  Post vital signs: Reviewed and stable  Last Vitals:  Vitals:   07/17/16 0708 07/17/16 0939  BP: 131/89 132/82  Pulse: 98 85  Resp: 14 (!) 7  Temp: 36.7 C (!) 36.4 C    Last Pain:  Vitals:   07/17/16 0939  TempSrc: Oral  PainSc:          Complications: No apparent anesthesia complications

## 2016-07-18 LAB — CBC
HEMATOCRIT: 25.7 % — AB (ref 35.0–47.0)
Hemoglobin: 8.8 g/dL — ABNORMAL LOW (ref 12.0–16.0)
MCH: 29 pg (ref 26.0–34.0)
MCHC: 34.1 g/dL (ref 32.0–36.0)
MCV: 85.2 fL (ref 80.0–100.0)
Platelets: 144 10*3/uL — ABNORMAL LOW (ref 150–440)
RBC: 3.02 MIL/uL — ABNORMAL LOW (ref 3.80–5.20)
RDW: 13.4 % (ref 11.5–14.5)
WBC: 12.2 10*3/uL — ABNORMAL HIGH (ref 3.6–11.0)

## 2016-07-18 NOTE — Anesthesia Post-op Follow-up Note (Signed)
  Anesthesia Pain Follow-up Note  Patient: Maria Richmond  Day #: 1  Date of Follow-up: 07/18/2016 Time: 7:47 AM  Last Vitals:  Vitals:   07/17/16 2254 07/18/16 0431  BP: 136/74 125/73  Pulse: 86 (!) 110  Resp: 20 20  Temp: 36.6 C 37.1 C    Level of Consciousness: alert  Pain: none   Side Effects:None  Catheter Site Exam:clean, dry, no drainage     Plan: Continue current therapy of postop epidural at surgeon's request  Karoline Caldwelleana Talaysha Freeberg

## 2016-07-18 NOTE — Lactation Note (Signed)
This note was copied from a baby's chart. Lactation Consultation Note  Patient Name: Maria Richmond [id number]'s Date: 07/18/2016 Reason for consult: Follow-up assessment   Maternal Data  Mother frustrated with breastfeeding after several tries, concerned about baby's intake, mom decided to bottle feed similac formula, encouraged her to let LC know if she wants to try breastfeeding again, mom given bottles of similac formula   Feeding Feeding Type: Breast Fed Length of feed: 10 min (approx. 5 min each side, no swallows heard, off and on) Difficult to get baby to open mouth, well, sucks on tongue  LATCH Score/Interventions Latch: Repeated attempts needed to sustain latch, nipple held in mouth throughout feeding, stimulation needed to elicit sucking reflex. (sucks on tongue doesn' t open mouth well) Intervention(s): Adjust position;Assist with latch;Breast massage;Breast compression  Audible Swallowing: None Intervention(s): Skin to skin;Hand expression  Type of Nipple: Everted at rest and after stimulation (right nipple shaped different than left)  Comfort (Breast/Nipple): Soft / non-tender     Hold (Positioning): Assistance needed to correctly position infant at breast and maintain latch.  LATCH Score: 6  Lactation Tools Discussed/Used WIC Program: Yes   Consult Status Consult Status: Follow-up Date: 07/18/16 Follow-up type: In-patient    Dyann KiefMarsha D Betzaida Cremeens 07/18/2016, 6:31 PM

## 2016-07-18 NOTE — Anesthesia Postprocedure Evaluation (Signed)
Anesthesia Post Note  Patient: Maria Richmond  Procedure(s) Performed: Procedure(s) (LRB): CESAREAN SECTION, Female, 6lb, 6oz (N/A)  Patient location during evaluation: [facility] Anesthesia Type: Spinal Level of consciousness: awake, awake and alert and oriented Pain management: pain level controlled Vital Signs Assessment: post-procedure vital signs reviewed and stable Respiratory status: spontaneous breathing Cardiovascular status: blood pressure returned to baseline Postop Assessment: no headache, no backache, no signs of nausea or vomiting and adequate PO intake Anesthetic complications: no     Last Vitals:  Vitals:   07/17/16 2254 07/18/16 0431  BP: 136/74 125/73  Pulse: 86 (!) 110  Resp: 20 20  Temp: 36.6 C 37.1 C    Last Pain:  Vitals:   07/18/16 0431  TempSrc: Oral  PainSc:                  Maria Caldwelleana Kanaan Richmond

## 2016-07-18 NOTE — Progress Notes (Signed)
POD #1 s/p repeat CS Subjective:   Tired. Denies lightheadedness when OOB. Tolerating a regular diet. Passing flatus. Voided after foley discontinued this AM. Breast feeding  Objective:  Blood pressure 134/66, pulse (!) 106, temperature 98.6 F (37 C), temperature source Oral, resp. rate 18, height 5\' 2"  (1.575 m), weight 81.2 kg (179 lb), SpO2 99 %, unknown if currently breastfeeding.  Urine output 2500ml  in the past 24 hours General: NAD Heart: RRR with Grade II/VI systolic murmur best heard at pulmonic area Pulmonary: no increased work of breathing/ CTAB Abdomen: non-distended, non-tender, fundus firm at level of umbilicus-1, NABS Incision: dressing C+D+I, ON Q intact Extremities: no edema, no erythema, no tenderness  Results for orders placed or performed during the hospital encounter of 07/17/16 (from the past 72 hour(s))  Urine Drug Screen, Qualitative ([facility] only)     Status: None   Collection Time: 07/17/16  5:53 AM  Result Value Ref Range   Tricyclic, Ur Screen NONE DETECTED NONE DETECTED   Amphetamines, Ur Screen NONE DETECTED NONE DETECTED   MDMA (Ecstasy)Ur Screen NONE DETECTED NONE DETECTED   Cocaine Metabolite,Ur [place] NONE DETECTED NONE DETECTED   Opiate, Ur Screen NONE DETECTED NONE DETECTED   Phencyclidine (PCP) Ur S NONE DETECTED NONE DETECTED   Cannabinoid 50 Ng, Ur [place] NONE DETECTED NONE DETECTED   Barbiturates, Ur Screen NONE DETECTED NONE DETECTED   Benzodiazepine, Ur Scrn NONE DETECTED NONE DETECTED   Methadone Scn, Ur NONE DETECTED NONE DETECTED    Comment: (NOTE) 100  Tricyclics, urine               Cutoff 1000 ng/mL 200  Amphetamines, urine             Cutoff 1000 ng/mL 300  MDMA (Ecstasy), urine           Cutoff 500 ng/mL 400  Cocaine Metabolite, urine       Cutoff 300 ng/mL 500  Opiate, urine                   Cutoff 300 ng/mL 600  Phencyclidine (PCP), urine      Cutoff 25 ng/mL 700  Cannabinoid, urine              Cutoff 50 ng/mL 800  Barbiturates,  urine             Cutoff 200 ng/mL 900  Benzodiazepine, urine           Cutoff 200 ng/mL 1000 Methadone, urine                Cutoff 300 ng/mL 1100 1200 The urine drug screen provides only a preliminary, unconfirmed 1300 analytical test result and should not be used for non-medical 1400 purposes. Clinical consideration and professional judgment should 1500 be applied to any positive drug screen result due to possible 1600 interfering substances. A more specific alternate chemical method 1700 must be used in order to obtain a confirmed analytical result.  1800 Gas chromato graphy / mass spectrometry (GC/MS) is the preferred 1900 confirmatory method.   CBC     Status: Abnormal   Collection Time: 07/18/16  4:39 AM  Result Value Ref Range   WBC 12.2 (H) 3.6 - 11.0 K/uL   RBC 3.02 (L) 3.80 - 5.20 MIL/uL   Hemoglobin 8.8 (L) 12.0 - 16.0 g/dL    Comment: RESULT REPEATED AND VERIFIED   HCT 25.7 (L) 35.0 - 47.0 %   MCV 85.2 80.0 - 100.0 fL  MCH 29.0 26.0 - 34.0 pg   MCHC 34.1 32.0 - 36.0 g/dL   RDW 16.1 09.6 - 04.5 %   Platelets 144 (L) 150 - 440 K/uL     Assessment:   21 y.o. [id number] postoperativeday # 1-stable Continue postoperative/ postpartum care Ambulate Regular diet   Plan:  1) Acute blood loss anemia - hemodynamically stable and asymptomatic - po iron and vitamins  2) O POS/ RI/ VNI-vaccinate with varivax prior to discharge  3) TDAP given 05/13/2016  4) Breast/ Contraception?  5) Disposition-probably POD #3  Farrel Conners, CNM

## 2016-07-19 LAB — CHLAMYDIA/NGC RT PCR (ARMC ONLY)
CHLAMYDIA TR: NOT DETECTED
N GONORRHOEAE: NOT DETECTED

## 2016-07-19 MED ORDER — OXYCODONE-ACETAMINOPHEN 5-325 MG PO TABS: 1.0000 | ORAL_TABLET | ORAL | 0 refills | Status: DC | PRN

## 2016-07-19 MED ORDER — IBUPROFEN 600 MG PO TABS: 600.0000 mg | ORAL_TABLET | Freq: Four times a day (QID) | ORAL | 0 refills | Status: DC | PRN

## 2016-07-19 NOTE — Progress Notes (Signed)
Patient discharged home with infant and significant other. Discharge instructions, prescriptions, hygiene kit and follow up appointment given to and reviewed with patient and significant other. Patient verbalized understanding. Escorted out via wheelchair by auxiliary.

## 2016-11-19 ENCOUNTER — Emergency Department
Admission: EM | Admit: 2016-11-19 | Discharge: 2016-11-19 | Disposition: A | Discharge status: Home / Self Care | Payer: Medicaid Other | Attending: Emergency Medicine | Admitting: Emergency Medicine

## 2016-11-19 ENCOUNTER — Encounter: Payer: Self-pay | Admitting: Intensive Care

## 2016-11-19 ENCOUNTER — Emergency Department: Payer: Medicaid Other

## 2016-11-19 DIAGNOSIS — Z87891 Personal history of nicotine dependence: Secondary | ICD-10-CM | POA: Insufficient documentation

## 2016-11-19 DIAGNOSIS — J029 Acute pharyngitis, unspecified: Secondary | ICD-10-CM | POA: Diagnosis present

## 2016-11-19 DIAGNOSIS — R05 Cough: Secondary | ICD-10-CM | POA: Insufficient documentation

## 2016-11-19 DIAGNOSIS — Z79899 Other long term (current) drug therapy: Secondary | ICD-10-CM | POA: Insufficient documentation

## 2016-11-19 DIAGNOSIS — J069 Acute upper respiratory infection, unspecified: Secondary | ICD-10-CM | POA: Diagnosis not present

## 2016-11-19 DIAGNOSIS — B349 Viral infection, unspecified: Secondary | ICD-10-CM | POA: Insufficient documentation

## 2016-11-19 DIAGNOSIS — J028 Acute pharyngitis due to other specified organisms: Secondary | ICD-10-CM

## 2016-11-19 DIAGNOSIS — B9789 Other viral agents as the cause of diseases classified elsewhere: Secondary | ICD-10-CM

## 2016-11-19 LAB — POCT RAPID STREP A: Streptococcus, Group A Screen (Direct): NEGATIVE

## 2016-11-19 MED ORDER — MAGIC MOUTHWASH W/LIDOCAINE
5.0000 mL | Freq: Four times a day (QID) | ORAL | 0 refills | Status: DC
Start: 2016-11-19 — End: 2017-05-27

## 2016-11-19 MED ORDER — PSEUDOEPH-BROMPHEN-DM 30-2-10 MG/5ML PO SYRP: 5.0000 mL | ORAL_SOLUTION | Freq: Four times a day (QID) | ORAL | 0 refills | Status: DC | PRN

## 2016-11-19 MED ORDER — DIPHENHYDRAMINE HCL 12.5 MG/5ML PO ELIX
25.0000 mg | ORAL_SOLUTION | Freq: Once | ORAL | Status: AC
  Administered 2016-11-19: 25 mg via ORAL
  Filled 2016-11-19: qty 10

## 2016-11-19 MED ORDER — METHYLPREDNISOLONE 4 MG PO TBPK: ORAL_TABLET | ORAL | 0 refills | Status: DC

## 2016-11-19 MED ORDER — LIDOCAINE VISCOUS 2 % MT SOLN
15.0000 mL | Freq: Once | OROMUCOSAL | Status: AC
  Administered 2016-11-19: 15 mL via OROMUCOSAL
  Filled 2016-11-19: qty 15

## 2016-11-19 NOTE — Discharge Instructions (Signed)
.   Rapid strep test was negative although cultures pending. He will be treated for viral pharyngitis and upper rest or infection. Take medication as directed and [facility] no improvement in one week.

## 2016-11-19 NOTE — ED Provider Notes (Signed)
[facility] Emergency Department Provider Note   ____________________________________________   First MD Initiated Contact with Patient 11/19/16 1212     (approximate)  I have reviewed the triage vital signs and the nursing notes.   HISTORY  Chief Complaint URI    HPI Maria Richmond is a 21 y.o. female patient presents 1 week of head congestion, runny nose, sore throat, cough. Patient state no relief from over-the-counter cough preparations. Patient denies fever/chills. Patient denies nausea vomiting diarrhea. Patient has history of strep.   Past Medical History:  Diagnosis Date  . Depression   . Medical history non-contributory   . Preterm labor     Patient Active Problem List   Diagnosis Date Noted  . Status post cesarean delivery 07/17/2016  . Labor and delivery indication for care or intervention 06/25/2016  . High risk pregnancy with low PAPPA 06/25/2016  . High risk pregnancy due to history of preterm labor, third trimester 06/25/2016  . History of placenta abruption 06/25/2016  . Quit smoking within past year 02/21/2016  . History of classical cesarean section 01/31/2016  . First trimester screening elevated DSR    . History of preterm delivery 09/06/2014  . Preterm premature rupture of membranes (PPROM) with unknown onset of labor 07/17/2014    Past Surgical History:  Procedure Laterality Date  . CESAREAN SECTION      Prior to Admission medications   Medication Sig Start Date End Date Taking? Authorizing Provider  brompheniramine-pseudoephedrine-DM 30-2-10 MG/5ML syrup Take 5 mLs 4 (four) times daily as needed by mouth. 11/19/16   Joni ReiningSmith, Ronald K, PA-C  ibuprofen (ADVIL,MOTRIN) 600 MG tablet Take 1 tablet (600 mg total) by mouth every 6 (six) hours as needed for mild pain or moderate pain. 07/19/16   Vena AustriaStaebler, Andreas, MD  magic mouthwash w/lidocaine SOLN Take 5 mLs 4 (four) times daily by mouth. 11/19/16   Joni ReiningSmith, Ronald K,  PA-C  methylPREDNISolone (MEDROL DOSEPAK) 4 MG TBPK tablet Take Tapered dose as directed 11/19/16   Joni ReiningSmith, Ronald K, PA-C  OVER THE COUNTER MEDICATION Take 65 mg by mouth daily. Iron tablet     [provider]  oxyCODONE-acetaminophen (PERCOCET/ROXICET) 5-325 MG tablet Take 1 tablet by mouth every 4 (four) hours as needed for moderate pain or severe pain. 07/19/16   Vena AustriaStaebler, Andreas, MD  Prenatal Vit-Fe Fumarate-FA (PRENATAL MULTIVITAMIN) TABS tablet Take 1 tablet by mouth daily at 12 noon.    [provider]    Allergies Patient has no known allergies.  History reviewed. No pertinent family history.  Social History Social History   Tobacco Use  . Smoking status: Former Smoker    Packs/day: 1.00    Types: Cigarettes  . Smokeless tobacco: Never Used  Substance Use Topics  . Alcohol use: No  . Drug use: No    Comment: positive uds for MJ, states no use recently    Review of Systems Constitutional: No fever/chills Eyes: No visual changes. ENT: Sore throat and nasal congestion  Cardiovascular: Denies chest pain. Respiratory: Denies shortness of breath. Nonproductive cough Gastrointestinal: No abdominal pain.  No nausea, no vomiting.  No diarrhea.  No constipation. Genitourinary: Negative for dysuria. Musculoskeletal: Negative for back pain. Skin: Negative for rash. Neurological: Negative for headaches, focal weakness or numbness. Psychiatric:Depression ___________   PHYSICAL EXAM:  VITAL SIGNS: ED Triage Vitals [11/19/16 1136]  Enc Vitals Group     BP 131/78     Pulse Rate 85     Resp 16  Temp 97.8 F (36.6 C)     Temp Source Oral     SpO2 100 %     Weight 164 lb 14.5 oz (74.8 kg)     Height 5\' 2"  (1.575 m)     Head Circumference      Peak Flow      Pain Score 5     Pain Loc      Pain Edu?      Excl. in GC?    Constitutional: Alert and oriented. Well appearing and in no acute distress. Nose: Edematous nasal turbinates  Mouth/Throat:  Mucous membranes are moist.  Oropharynx non-erythematous. Postnasal drainage with bilateral edematous tonsils Neck: No stridor.  Bilateral cervical spine tenderness to palpation. Hematological/Lymphatic/Immunilogical: No cervical lymphadenopathy. Cardiovascular: Normal rate, regular rhythm. Grossly normal heart sounds.  Good peripheral circulation. Respiratory: Normal respiratory effort.  No retractions. Lungs CTAB. Neurologic:  Normal speech and language. No gross focal neurologic deficits are appreciated. No gait instability. Skin:  Skin is warm, dry and intact. No rash noted. Psychiatric: Mood and affect are normal. Speech and behavior are normal.  ____________________________________________   LABS (all labs ordered are listed, but only abnormal results are displayed)  Labs Reviewed  POCT RAPID STREP A   ____________________________________________  EKG   ____________________________________________  RADIOLOGY  Dg Chest 2 View  Result Date: 11/19/2016 CLINICAL DATA:  21 year old female with productive cough for 1 week. EXAM: CHEST  2 VIEW COMPARISON:  CT Abdomen and Pelvis 02/04/2013 FINDINGS: The heart size and mediastinal contours are within normal limits. Both lungs are clear. The visualized skeletal structures are unremarkable. Negative visible bowel gas pattern. IMPRESSION: Negative.  No acute cardiopulmonary abnormality. Electronically Signed   By: Odessa FlemingH  Hall M.D.   On: 11/19/2016 13:58    ____________________________________________   PROCEDURES  Procedure(s) performed: None  Procedures  Critical Care performed: No  ____________________________________________   INITIAL IMPRESSION / ASSESSMENT AND PLAN / ED COURSE  As part of my medical decision making, I reviewed the following data within the electronic MEDICAL RECORD NUMBER    Patient presents with head congestion, runny nose, sore throat and cough one. Physical exam is consistent with viral infection.  Discussed negative rapid strep and chest x-ray finding with patient. Patient given discharge Instructions. Patient advised to take medications as directed and [facility].      ____________________________________________   FINAL CLINICAL IMPRESSION(S) / ED DIAGNOSES  Final diagnoses:  Viral URI with cough  Acute viral pharyngitis     ED Discharge Orders        Ordered    magic mouthwash w/lidocaine SOLN  4 times daily    Comments:  Mix 30 ml of Benadryl, 30 ml of Alum& Mag Hydroxide-Simeth, 30 ml of Nystatin, and 30 ml of Viscous Lidocaine.   11/19/16 1410    brompheniramine-pseudoephedrine-DM 30-2-10 MG/5ML syrup  4 times daily PRN     11/19/16 1410    methylPREDNISolone (MEDROL DOSEPAK) 4 MG TBPK tablet     11/19/16 1410       Note:  This document was prepared using Dragon voice recognition software and may include unintentional dictation errors.    Joni ReiningSmith, Ronald K, PA-C 11/19/16 1415    Emily FilbertWilliams, Jonathan E, MD 11/19/16 [phone]

## 2016-11-19 NOTE — ED Notes (Signed)
Pt verbalized understanding of discharge instructions. NAD at this time. 

## 2016-11-19 NOTE — ED Triage Notes (Signed)
Patient c/o head congestion, runny nose, sore throat and cough X1 week. Reports taking dayquil/nyquil at home with no relief. No respiratory distress in triage. Ambulatory with no problems. Reports HX of strep

## 2017-05-19 ENCOUNTER — Other Ambulatory Visit: Payer: Self-pay | Admitting: Family Medicine

## 2017-05-19 DIAGNOSIS — Z3480 Encounter for supervision of other normal pregnancy, unspecified trimester: Secondary | ICD-10-CM

## 2017-05-22 ENCOUNTER — Ambulatory Visit: Admission: RE | Admit: 2017-05-22 | Payer: Self-pay | Source: Ambulatory Visit

## 2017-05-27 ENCOUNTER — Observation Stay
Admission: EM | Admit: 2017-05-27 | Discharge: 2017-05-27 | Disposition: A | Discharge status: Home / Self Care | Payer: Medicaid Other | Attending: Obstetrics and Gynecology | Admitting: Obstetrics and Gynecology

## 2017-05-27 ENCOUNTER — Other Ambulatory Visit: Payer: Self-pay

## 2017-05-27 ENCOUNTER — Observation Stay: Payer: Medicaid Other

## 2017-05-27 DIAGNOSIS — Z87891 Personal history of nicotine dependence: Secondary | ICD-10-CM | POA: Diagnosis not present

## 2017-05-27 DIAGNOSIS — O219 Vomiting of pregnancy, unspecified: Secondary | ICD-10-CM | POA: Diagnosis not present

## 2017-05-27 DIAGNOSIS — Z3A01 Less than 8 weeks gestation of pregnancy: Secondary | ICD-10-CM

## 2017-05-27 DIAGNOSIS — O99342 Other mental disorders complicating pregnancy, second trimester: Secondary | ICD-10-CM | POA: Insufficient documentation

## 2017-05-27 DIAGNOSIS — F329 Major depressive disorder, single episode, unspecified: Secondary | ICD-10-CM

## 2017-05-27 DIAGNOSIS — Z3490 Encounter for supervision of normal pregnancy, unspecified, unspecified trimester: Secondary | ICD-10-CM

## 2017-05-27 LAB — PREGNANCY, URINE: Preg Test, Ur: POSITIVE — AB

## 2017-05-27 LAB — HCG, QUANTITATIVE, PREGNANCY: HCG, BETA CHAIN, QUANT, S: 84012 m[IU]/mL — AB (ref <5)

## 2017-05-27 NOTE — Discharge Summary (Signed)
Physician Final Progress Note  Patient ID: Maria Richmond MRN: [id number] DOB/AGE: 1995-08-21 22 y.o.  Admit date: 05/27/2017 Admitting provider: Conard Novak, MD Discharge date: 05/27/2017   Admission Diagnoses: nausea and vomiting, decreased fetal movement  Discharge Diagnoses:  Active Problems:   Indication for care in labor and delivery, antepartum 22 yo G4 P1111 at 7 weeks 5 days by ultrasound today, nausea of pregnancy  History of Present Illness: The patient is a 22 y.o. female [phone] at [redacted]w[redacted]d who presents for nausea and vomiting for the past few days. She has been to one prenatal visit at [facility] about 2 weeks ago and was told she was 18 weeks at that time based on her LMP. She also said today that she hadn't felt the baby move in a couple of days. Beta Hcg and ultrasound today date the pregnancy at 7 weeks 5 days. She prefers to transfer her care to [facility] and plans to call to schedule an initial prenatal appointment.   Past Medical History:  Diagnosis Date  . Depression   . Medical history non-contributory   . Preterm labor     Past Surgical History:  Procedure Laterality Date  . CESAREAN SECTION    . CESAREAN SECTION N/A 07/17/2016   Procedure: CESAREAN SECTION, Female, 6lb, 6oz;  Surgeon: Conard Novak, MD;  Location: [facility] ORS;  Service: Obstetrics;  Laterality: N/A;    No current facility-administered medications on file prior to encounter.    Current Outpatient Medications on File Prior to Encounter  Medication Sig Dispense Refill  . Prenatal Vit-Fe Fumarate-FA (PRENATAL MULTIVITAMIN) TABS tablet Take 1 tablet by mouth daily at 12 noon.      No Known Allergies  Social History   Socioeconomic History  . Marital status: Single    Spouse name: Not on file  . Number of children: Not on file  . Years of education: Not on file  . Highest education level: Not on file  Occupational History  . Not on file  Social Needs  . Financial resource  strain: Not on file  . Food insecurity:    Worry: Not on file    Inability: Not on file  . Transportation needs:    Medical: Not on file    Non-medical: Not on file  Tobacco Use  . Smoking status: Former Smoker    Packs/day: 1.00    Types: Cigarettes  . Smokeless tobacco: Never Used  Substance and Sexual Activity  . Alcohol use: No  . Drug use: No    Comment: positive uds for MJ, states no use recently  . Sexual activity: Yes  Lifestyle  . Physical activity:    Days per week: Not on file    Minutes per session: Not on file  . Stress: Not on file  Relationships  . Social connections:    Talks on phone: Not on file    Gets together: Not on file    Attends religious service: Not on file    Active member of club or organization: Not on file    Attends meetings of clubs or organizations: Not on file    Relationship status: Not on file  . Intimate partner violence:    Fear of current or ex partner: Not on file    Emotionally abused: Not on file    Physically abused: Not on file    Forced sexual activity: Not on file  Other Topics Concern  . Not on file  Social History Narrative   **  Merged History Encounter **        Physical Exam: BP 124/70 (BP Location: Left Arm)   Pulse 85   Temp 98.5 F (36.9 C) (Oral)   Resp 17   Ht 5\' 2"  (1.575 m)   Wt 164 lb (74.4 kg)   LMP 01/07/2017   BMI 30.00 kg/m   Gen: NAD CV: RRR Pulm: CTAB Pelvic: deferred Ext: no evidence of DVT  Consults: None  Significant Findings/ Diagnostic Studies: labs and ultrasound  Results for Maria, Richmond [facility] (MRN [id number]) as of 05/27/2017 19:18  Ref. Range 05/27/2017 15:59 05/27/2017 16:41 05/27/2017 17:46  HCG, Beta Chain, Quant, S Latest Ref Range: <5 mIU/mL  84,012 (H)   Preg Test, Ur Latest Ref Range: NEGATIVE  POSITIVE (A)    US OB LESS THAN 14 WEEKS WITH OB TRANSVAGINAL Unknown   Rpt   CLINICAL DATA:  Nausea and vomiting, quantitative HCG of 84,000  EXAM: OBSTETRIC <14 WK [place] AND  TRANSVAGINAL OB US  TECHNIQUE: Both transabdominal and transvaginal ultrasound examinations were performed for complete evaluation of the gestation as well as the maternal uterus, adnexal regions, and pelvic cul-de-sac. Transvaginal technique was performed to assess early pregnancy.  COMPARISON:  None.  FINDINGS: Intrauterine gestational sac: Visible  Yolk sac:  Visible  Embryo:  Visible  Cardiac Activity: Visible  Heart Rate: 158 bpm  CRL: 14.2 mm   7 w   5 d                  [place] EDC: 01/08/2018  Subchorionic hemorrhage:  None visualized.  Maternal uterus/adnexae: Ovaries are within normal limits. The left ovary measures 2.1 x 1.6 x 2.1 cm. Right ovary measures 2.4 x 2.1 x 2.1 cm. Small amount of free fluid.  IMPRESSION: 1. Single viable intrauterine pregnancy as above 2. Small amount of free fluid in the pelvis   Electronically Signed   By: Jasmine Pang M.D.   On: 05/27/2017 18:40  Procedures: none  Discharge Condition: good  Disposition: Discharge disposition: 01-Home or Self Care       Diet: Regular diet  Discharge Activity: Activity as tolerated  Discharge Instructions    Discharge activity:  No Restrictions   Complete by:  As directed    Discharge diet:  No restrictions   Complete by:  As directed    No sexual activity restrictions   Complete by:  As directed    Notify physician for a general feeling that "something is not right"   Complete by:  As directed    Notify physician for increase or change in vaginal discharge   Complete by:  As directed    Notify physician for intestinal cramps, with or without diarrhea, sometimes described as "gas pain"   Complete by:  As directed    Notify physician for leaking of fluid   Complete by:  As directed    Notify physician for low, dull backache, unrelieved by heat or Tylenol   Complete by:  As directed    Notify physician for menstrual like cramps   Complete by:  As directed    Notify  physician for pelvic pressure   Complete by:  As directed    Notify physician for uterine contractions.  These may be painless and feel like the uterus is tightening or the baby is  "balling up"   Complete by:  As directed    Notify physician for vaginal bleeding   Complete by:  As directed    PRETERM  LABOR:  Includes any of the follwing symptoms that occur between 20 - [redacted] weeks gestation.  If these symptoms are not stopped, preterm labor can result in preterm delivery, placing your baby at risk   Complete by:  As directed      Allergies as of 05/27/2017   No Known Allergies     Medication List    STOP taking these medications   brompheniramine-pseudoephedrine-DM 30-2-10 MG/5ML syrup   ibuprofen 600 MG tablet Commonly known as:  ADVIL,MOTRIN   magic mouthwash w/lidocaine Soln   methylPREDNISolone 4 MG Tbpk tablet Commonly known as:  MEDROL DOSEPAK   OVER THE COUNTER MEDICATION   oxyCODONE-acetaminophen 5-325 MG tablet Commonly known as:  PERCOCET/ROXICET     TAKE these medications   prenatal multivitamin Tabs tablet Take 1 tablet by mouth daily at 12 noon.      Follow-up Information    [facility]. Schedule an appointment as soon as possible for a visit.   Specialty:  Obstetrics and Gynecology Why:  prenatal care Contact information: [address] [place] [address] [phone]          Total time spent taking care of this patient: 30 minutes  Signed: Tresea Mall, CNM  05/27/2017, 7:08 PM

## 2017-05-27 NOTE — OB Triage Note (Signed)
Patient presented to L&D with complaints of nausea/ vomiting since 2 days ago.  States she has thrown up 3 times today and has only been able to keep foods like crackers or bread down.  Denies vaginal bleeding or leaking fluid. States she hasn't felt any fetal movement since Saturday. States she was feeling fetal movement regularly before then.

## 2017-05-27 NOTE — Progress Notes (Signed)
Patient given due date of 10/14/17 by [facility].  Unable to obtain FHT with external fetal monitor or handheld doppler.  J.Gledhill, CNM notified of findings, coming to assess patient/ perform a bedside US.

## 2017-06-03 ENCOUNTER — Encounter: Payer: Self-pay | Admitting: Maternal Newborn

## 2017-06-03 ENCOUNTER — Ambulatory Visit (INDEPENDENT_AMBULATORY_CARE_PROVIDER_SITE_OTHER): Payer: Medicaid Other | Admitting: Maternal Newborn

## 2017-06-03 VITALS — BP 130/90 | Wt 158.0 lb

## 2017-06-03 DIAGNOSIS — Z3A08 8 weeks gestation of pregnancy: Secondary | ICD-10-CM

## 2017-06-03 DIAGNOSIS — Z113 Encounter for screening for infections with a predominantly sexual mode of transmission: Secondary | ICD-10-CM

## 2017-06-03 DIAGNOSIS — O09211 Supervision of pregnancy with history of pre-term labor, first trimester: Secondary | ICD-10-CM

## 2017-06-03 DIAGNOSIS — O99331 Smoking (tobacco) complicating pregnancy, first trimester: Secondary | ICD-10-CM

## 2017-06-03 DIAGNOSIS — O09219 Supervision of pregnancy with history of pre-term labor, unspecified trimester: Secondary | ICD-10-CM | POA: Insufficient documentation

## 2017-06-03 DIAGNOSIS — F419 Anxiety disorder, unspecified: Secondary | ICD-10-CM

## 2017-06-03 DIAGNOSIS — F329 Major depressive disorder, single episode, unspecified: Secondary | ICD-10-CM

## 2017-06-03 DIAGNOSIS — O09891 Supervision of other high risk pregnancies, first trimester: Secondary | ICD-10-CM

## 2017-06-03 NOTE — Progress Notes (Signed)
06/04/2017   Chief Complaint: Desires prenatal care.  Transfer of Care Patient: yes, began prenatal care at [facility], seen for one visit  History of Present Illness: Ms. Finder is a 22 y.o. [phone] at [redacted]w[redacted]d based on Ultrasound with an Estimated Date of Delivery: 01/08/18, with the above CC.   Her periods were: irregular periods  She was using no method when she conceived.  She has Positive signs or symptoms of nausea/vomiting of pregnancy. She has Negative signs or symptoms of miscarriage or preterm labor She identifies Negative Zika risk factors for her and her partner On any different medications around the time she conceived/early pregnancy: No  History of varicella: Yes   ROS: She endorses fatigue, breast tenderness, headaches, dizziness, hot flashes, anxiety, and depression. Review of systems otherwise negative, except as stated in the above HPI.  OBGYN History: As per HPI. OB History  Gravida Para Term Preterm AB Living  SAB TAB Ectopic Multiple Live Births  1 0 0 0 2    # Outcome Date GA Lbr Len/2nd Weight Sex Delivery Anes PTL Lv  4 Current           3 Term 07/17/16 [redacted]w[redacted]d  6 lb 6 oz (2.892 kg) M CS-LTranv Spinal  LIV  2 Preterm 07/20/14    F CS-Classical   ND     Complications: Abruptio Placenta, Preterm premature rupture of membranes (PPROM) delivered, current hospitalization  1 SAB             Any issues with any prior pregnancies: yes, placental abruption and preterm labor and delivery via classical Cesarean at 26 weeks with G2 and SAB with G1. Any prior children are healthy, doing well, without any problems or issues: Healthy son born in 2018, preterm birth at 58 weeks with neonatal demise in 2016 History of pap smears: Yes. Last pap smear 2018 at [facility]. Abnormal: no  History of STIs: No   Past Medical History: Past Medical History:  Diagnosis Date  . Depression   . Medical history non-contributory   . Preterm labor     Past Surgical  History: Past Surgical History:  Procedure Laterality Date  . CESAREAN SECTION    . CESAREAN SECTION N/A 07/17/2016   Procedure: CESAREAN SECTION, Female, 6lb, 6oz;  Surgeon: Conard Novak, MD;  Location: [facility] ORS;  Service: Obstetrics;  Laterality: N/A;    Family History:  Family History  Problem Relation Age of Onset  . Diabetes Maternal Grandmother   . Hypertension Maternal Grandmother   . Heart Problems Maternal Grandmother   . Stroke Maternal Grandmother   . Thyroid disease Maternal Grandmother    She denies any female cancers, bleeding or blood clotting disorders.  She denies any history of intellectual disability, birth defects or genetic disorders in her or the FOB's history. FOB has diabetes.  Social History:  Social History   Socioeconomic History  . Marital status: Single    Spouse name: Not on file  . Number of children: 1  . Years of education: 61  . Highest education level: Not on file  Occupational History  . Occupation: UNEMPLOYED  Social Needs  . Financial resource strain: Not on file  . Food insecurity:    Worry: Not on file    Inability: Not on file  . Transportation needs:    Medical: Not on file    Non-medical: Not on file  Tobacco Use  . Smoking status: Former Smoker  Packs/day: 1.00    Types: Cigarettes  . Smokeless tobacco: Never Used  Substance and Sexual Activity  . Alcohol use: No  . Drug use: No    Comment: positive uds for MJ, states no use recently  . Sexual activity: Yes    Birth control/protection: None  Lifestyle  . Physical activity:    Days per week: Not on file    Minutes per session: Not on file  . Stress: Not on file  Relationships  . Social connections:    Talks on phone: Not on file    Gets together: Not on file    Attends religious service: Not on file    Active member of club or organization: Not on file    Attends meetings of clubs or organizations: Not on file    Relationship status: Not on file  . Intimate  partner violence:    Fear of current or ex partner: Not on file    Emotionally abused: Not on file    Physically abused: Not on file    Forced sexual activity: Not on file  Other Topics Concern  . Not on file  Social History Narrative   ** Merged History Encounter **       Any cats in the household: no Denies history of and current domestic violence. Social issues (living with grandfather but wants to move out). Saw Dorena Dew today.  Allergy: No Known Allergies  Current Outpatient Medications:  Current Outpatient Medications:  .  Prenatal Vit-Fe Fumarate-FA (PRENATAL MULTIVITAMIN) TABS tablet, Take 1 tablet by mouth daily at 12 noon., Disp: , Rfl:    Physical Exam:   BP 130/90   Wt 158 lb (71.7 kg)   LMP 01/07/2017   Breastfeeding? Unknown   BMI 28.90 kg/m  Body mass index is 28.9 kg/m. Constitutional: Well nourished, well developed female in no acute distress.  Neck:  Supple, normal appearance, and no thyromegaly  Cardiovascular: S1, S2 normal, no murmur, rub or gallop, regular rate and rhythm Respiratory:  Clear to auscultation bilaterally. Normal respiratory effort Abdomen: no masses, hernias; diffusely non tender to palpation, non distended Breasts: patient declines to have breast exam. Neuro/Psych:  Normal mood and affect.  Skin:  Warm and dry.  Lymphatic:  No inguinal lymphadenopathy.   Pelvic exam: is not limited by body habitus External genitalia, Bartholin's glands, Urethra, Skene's glands: within normal limits Vagina: within normal limits and with no blood in the vault  Cervix: normal appearing cervix without discharge or lesions, closed/long/high Uterus:  enlarged, c/w 9 week size Adnexa:  not evaluated  Assessment: Ms. Watton is a 22 y.o. [id number] [redacted]w[redacted]d based on Ultrasound with an Estimated Date of Delivery: 01/08/18, presenting for prenatal care.  Plan:  1) Avoid alcoholic beverages. 2) Patient encouraged not to smoke. Currently smoking about 3  cigarettes daily. 3) Discontinue the use of all non-medicinal drugs and chemicals. Admitted former substance use to Dorena Dew, limited details given. 4) Take prenatal vitamins daily.  5) Seatbelt use advised 6) Nutrition, food safety (fish, cheese advisories, and high nitrite foods) and exercise discussed. 7) Hospital and practice style delivering at [facility] discussed  8) Patient is asked about travel to areas at risk for the Zika virus, and counseled to avoid travel and exposure to mosquitoes or sexual partners who may have themselves been exposed to the virus. Testing is discussed, and will be ordered as appropriate.  9) Childbirth classes at [facility] advised 10) Genetic Screening, such as with 1st Trimester Screening,  cell free fetal DNA, AFP testing, and Ultrasound, as well as with amniocentesis and CVS as appropriate, is discussed with patient. She plans to have genetic testing this pregnancy. 11) Ultrasound on 05/27/17 showed singleton IUP with FHR 158 bpm and GA 7w 5d. 12) Patient disclosed to Dorena Dew that she is seeing a Haematologist at [facility] and considering starting medication for symptoms of anxiety and depression. 13) Bonjesta sample given, will call for Rx if effective.  Problem list reviewed and updated.  Marcelyn Bruins, CNM [facility], [facility] 06/03/2017  1:52 PM

## 2017-06-03 NOTE — Progress Notes (Signed)
No concerns, rj 

## 2017-06-04 ENCOUNTER — Other Ambulatory Visit: Payer: Self-pay | Admitting: Maternal Newborn

## 2017-06-04 DIAGNOSIS — F329 Major depressive disorder, single episode, unspecified: Secondary | ICD-10-CM | POA: Insufficient documentation

## 2017-06-04 DIAGNOSIS — O09891 Supervision of other high risk pregnancies, first trimester: Secondary | ICD-10-CM | POA: Insufficient documentation

## 2017-06-04 DIAGNOSIS — F419 Anxiety disorder, unspecified: Secondary | ICD-10-CM

## 2017-06-04 DIAGNOSIS — Z8751 Personal history of pre-term labor: Secondary | ICD-10-CM

## 2017-06-04 DIAGNOSIS — O99331 Smoking (tobacco) complicating pregnancy, first trimester: Secondary | ICD-10-CM | POA: Insufficient documentation

## 2017-06-04 DIAGNOSIS — F32A Depression, unspecified: Secondary | ICD-10-CM | POA: Insufficient documentation

## 2017-06-04 MED ORDER — HYDROXYPROGESTERONE CAPROATE 250 MG/ML IM OIL: 250.0000 mg | TOPICAL_OIL | INTRAMUSCULAR | 20 refills | Status: DC

## 2017-06-04 NOTE — Progress Notes (Signed)
Ordered Makena for patient.

## 2017-06-05 LAB — RPR+RH+ABO+RUB AB+AB SCR+CB...
ANTIBODY SCREEN: NEGATIVE
HIV SCREEN 4TH GENERATION: NONREACTIVE
Hematocrit: 35.3 % (ref 34.0–46.6)
Hemoglobin: 11.7 g/dL (ref 11.1–15.9)
Hepatitis B Surface Ag: NEGATIVE
MCH: 29.1 pg (ref 26.6–33.0)
MCHC: 33.1 g/dL (ref 31.5–35.7)
MCV: 88 fL (ref 79–97)
PLATELETS: 220 10*3/uL (ref 150–450)
RBC: 4.02 x10E6/uL (ref 3.77–5.28)
RDW: 14.1 % (ref 12.3–15.4)
RPR: NONREACTIVE
Rh Factor: POSITIVE
Rubella Antibodies, IGG: <0.9 index — ABNORMAL LOW (ref >0.99)
Varicella zoster IgG: <135 index — ABNORMAL LOW (ref >165)
WBC: 5.3 10*3/uL (ref 3.4–10.8)

## 2017-06-05 LAB — HEMOGLOBINOPATHY EVALUATION
HEMOGLOBIN A2 QUANTITATION: 2.4 % (ref 1.8–3.2)
HEMOGLOBIN F QUANTITATION: 0 % (ref 0.0–2.0)
HGB C: 0 %
HGB S: 0 %
HGB VARIANT: 0 %
Hgb A: 97.6 % (ref 96.4–98.8)

## 2017-06-09 LAB — URINE DRUG PANEL 7
Amphetamines, Urine: NEGATIVE ng/mL
BENZODIAZEPINE QUANT UR: NEGATIVE ng/mL
Barbiturate Quant, Ur: NEGATIVE ng/mL
CANNABINOID QUANT UR: POSITIVE — AB
COCAINE (METAB.): NEGATIVE ng/mL
OPIATE QUANT UR: NEGATIVE ng/mL
PCP QUANT UR: NEGATIVE ng/mL

## 2017-06-09 LAB — GC/CHLAMYDIA PROBE AMP
CHLAMYDIA, DNA PROBE: NEGATIVE
Neisseria gonorrhoeae by PCR: NEGATIVE

## 2017-06-09 LAB — URINE CULTURE

## 2017-06-17 ENCOUNTER — Ambulatory Visit (INDEPENDENT_AMBULATORY_CARE_PROVIDER_SITE_OTHER): Payer: Medicaid Other | Admitting: Obstetrics and Gynecology

## 2017-06-17 ENCOUNTER — Encounter: Payer: Self-pay | Admitting: Obstetrics and Gynecology

## 2017-06-17 VITALS — BP 120/80 | Wt 156.0 lb

## 2017-06-17 DIAGNOSIS — Z98891 History of uterine scar from previous surgery: Secondary | ICD-10-CM

## 2017-06-17 DIAGNOSIS — N760 Acute vaginitis: Secondary | ICD-10-CM

## 2017-06-17 DIAGNOSIS — Z124 Encounter for screening for malignant neoplasm of cervix: Secondary | ICD-10-CM

## 2017-06-17 DIAGNOSIS — O09211 Supervision of pregnancy with history of pre-term labor, first trimester: Secondary | ICD-10-CM

## 2017-06-17 DIAGNOSIS — O09891 Supervision of other high risk pregnancies, first trimester: Secondary | ICD-10-CM

## 2017-06-17 DIAGNOSIS — Z8751 Personal history of pre-term labor: Secondary | ICD-10-CM

## 2017-06-17 DIAGNOSIS — Z1379 Encounter for other screening for genetic and chromosomal anomalies: Secondary | ICD-10-CM

## 2017-06-17 DIAGNOSIS — Z3A10 10 weeks gestation of pregnancy: Secondary | ICD-10-CM

## 2017-06-17 DIAGNOSIS — Z8759 Personal history of other complications of pregnancy, childbirth and the puerperium: Secondary | ICD-10-CM

## 2017-06-17 MED ORDER — CITRANATAL BLOOM 90-1 MG PO TABS: 1.0000 | ORAL_TABLET | Freq: Every day | ORAL | 11 refills | Status: DC

## 2017-06-17 NOTE — Progress Notes (Signed)
Routine Prenatal Care Visit  Subjective  Maria Richmond is a 22 y.o. [phone] at 8187w5d being seen today for ongoing prenatal care.  She is currently monitored for the following issues for this high-risk pregnancy and has History of classical cesarean section; History of placenta abruption; History of preterm delivery; High risk pregnancy due to history of preterm labor; Pregnancy complicated by tobacco use in first trimester; Short interval between pregnancies affecting pregnancy in first trimester, antepartum; and Anxiety and depression on their problem list.  ----------------------------------------------------------------------------------- Patient reports no complaints.    . Vag. Bleeding: None.   . Denies leaking of fluid.  ----------------------------------------------------------------------------------- The following portions of the patient's history were reviewed and updated as appropriate: allergies, current medications, past family history, past medical history, past social history, past surgical history and problem list. Problem list updated.   Objective  Blood pressure 120/80, weight 156 lb (70.8 kg), last menstrual period 01/07/2017, unknown if currently breastfeeding. Pregravid weight 160 lb (72.6 kg) Total Weight Gain -4 lb (-1.814 kg) Urinalysis:      Fetal Status:           General:  Alert, oriented and cooperative. Patient is in no acute distress.  Skin: Skin is warm and dry. No rash noted.   Cardiovascular: Normal heart rate noted  Respiratory: Normal respiratory effort, no problems with respiration noted  Abdomen: Soft, gravid, appropriate for gestational age. Pain/Pressure: Absent     Pelvic:  Cervical exam performed        Extremities: Normal range of motion.     ental Status: Normal mood and affect. Normal behavior. Normal judgment and thought content.     Assessment   22 y.o. [name] at 5787w5d by  01/08/2018, by Ultrasound presenting for routine  prenatal visit  Plan   G4 Problems (from 05/27/17 to present)    Problem Noted Resolved   Pregnancy complicated by tobacco use in first trimester 06/04/2017 by Oswaldo ConroySchmid, Jacelyn Y, CNM No   Short interval between pregnancies affecting pregnancy in first trimester, antepartum 06/04/2017 by Oswaldo ConroySchmid, Jacelyn Y, CNM No   High risk pregnancy due to history of preterm labor 06/03/2017 by Oswaldo ConroySchmid, Jacelyn Y, CNM No   Overview Signed 06/03/2017  1:52 PM by Oswaldo ConroySchmid, Jacelyn Y, CNM    Clinic [facility] Prenatal Labs  Dating  Blood type: --/--/O POS (07/11 0934)   Genetic Screen 1 Screen:    AFP:     Quad:     NIPS: Antibody:NEG (07/11 0934)  Anatomic US  Rubella:   Varicella:    GTT Early:               Third trimester:  RPR: Non Reactive (07/11 0934)   Rhogam  HBsAg:     TDaP vaccine                       Flu Shot: HIV: NON REACTIVE (07/11 0934)   Baby Food                                GBS:   Contraception  Pap:  CBB     CS/VBAC    Support Person                  Gestational age appropriate obstetric precautions including but not limited to vaginal bleeding, contractions, leaking of fluid and fetal movement were reviewed in detail with the patient.  Pap smear collected today. Materniti 21 test today.  Nuswab sent, suspected vaginitis. Start 17-OHP at 16 weeks, screening transvaginal cervical length at 16 weeks.  Return in about 2 weeks (around 07/01/2017) for ROB.  Adelene Idler MD [facility], [facility] 06/17/17 2:26 PM

## 2017-06-20 LAB — PAPIG, HPV, RFX 16/18
HPV, high-risk: NEGATIVE
PAP SMEAR COMMENT: 0

## 2017-06-21 LAB — MATERNIT 21 PLUS CORE, BLOOD
Chromosome 13: NEGATIVE
Chromosome 18: NEGATIVE
Chromosome 21: NEGATIVE
Y CHROMOSOME: NOT DETECTED

## 2017-06-22 NOTE — Progress Notes (Signed)
Can you please make an envelope for this patient with the gender. Thank you, Dr. Jerene PitchSchuman

## 2017-06-22 NOTE — Progress Notes (Signed)
NIL pap, released to mychart.

## 2017-06-23 ENCOUNTER — Other Ambulatory Visit: Payer: Self-pay | Admitting: Obstetrics and Gynecology

## 2017-06-23 DIAGNOSIS — A5901 Trichomonal vulvovaginitis: Secondary | ICD-10-CM

## 2017-06-23 DIAGNOSIS — N760 Acute vaginitis: Secondary | ICD-10-CM

## 2017-06-23 DIAGNOSIS — B9689 Other specified bacterial agents as the cause of diseases classified elsewhere: Secondary | ICD-10-CM

## 2017-06-23 LAB — NUSWAB VAGINITIS PLUS (VG+)
ATOPOBIUM VAGINAE: HIGH {score} — AB
BVAB 2: HIGH Score — AB
CHLAMYDIA TRACHOMATIS, NAA: NEGATIVE
Candida albicans, NAA: NEGATIVE
Candida glabrata, NAA: NEGATIVE
Neisseria gonorrhoeae, NAA: NEGATIVE
Trich vag by NAA: POSITIVE — AB

## 2017-06-23 MED ORDER — METRONIDAZOLE 500 MG PO TABS: 500.0000 mg | ORAL_TABLET | Freq: Two times a day (BID) | ORAL | 0 refills | Status: AC

## 2017-06-23 NOTE — Progress Notes (Signed)
TICH and BV positive, discussed with patient on phone. Recommended refraining form intercourse for two weeks after both her and her partner were treated to prevent reinfection. Prescription sent to pharmacy.

## 2017-06-30 ENCOUNTER — Emergency Department
Admission: EM | Admit: 2017-06-30 | Discharge: 2017-06-30 | Disposition: A | Discharge status: Home / Self Care | Payer: Medicaid Other | Attending: Emergency Medicine | Admitting: Emergency Medicine

## 2017-06-30 ENCOUNTER — Emergency Department: Payer: Medicaid Other

## 2017-06-30 ENCOUNTER — Other Ambulatory Visit: Payer: Self-pay

## 2017-06-30 DIAGNOSIS — Z79899 Other long term (current) drug therapy: Secondary | ICD-10-CM | POA: Insufficient documentation

## 2017-06-30 DIAGNOSIS — O26892 Other specified pregnancy related conditions, second trimester: Secondary | ICD-10-CM | POA: Diagnosis not present

## 2017-06-30 DIAGNOSIS — R1013 Epigastric pain: Secondary | ICD-10-CM | POA: Diagnosis present

## 2017-06-30 DIAGNOSIS — O219 Vomiting of pregnancy, unspecified: Secondary | ICD-10-CM | POA: Insufficient documentation

## 2017-06-30 DIAGNOSIS — Z87891 Personal history of nicotine dependence: Secondary | ICD-10-CM | POA: Insufficient documentation

## 2017-06-30 DIAGNOSIS — R109 Unspecified abdominal pain: Secondary | ICD-10-CM

## 2017-06-30 DIAGNOSIS — Z3A13 13 weeks gestation of pregnancy: Secondary | ICD-10-CM | POA: Insufficient documentation

## 2017-06-30 DIAGNOSIS — R1011 Right upper quadrant pain: Secondary | ICD-10-CM | POA: Diagnosis not present

## 2017-06-30 LAB — COMPREHENSIVE METABOLIC PANEL
ALT: 16 U/L (ref 0–44)
ANION GAP: 9 (ref 5–15)
AST: 22 U/L (ref 15–41)
Albumin: 3.5 g/dL (ref 3.5–5.0)
Alkaline Phosphatase: 46 U/L (ref 38–126)
BILIRUBIN TOTAL: 0.3 mg/dL (ref 0.3–1.2)
BUN: 7 mg/dL (ref 6–20)
CHLORIDE: 103 mmol/L (ref 98–111)
CO2: 22 mmol/L (ref 22–32)
Calcium: 9 mg/dL (ref 8.9–10.3)
Creatinine, Ser: 0.41 mg/dL — ABNORMAL LOW (ref 0.44–1.00)
GFR calc non Af Amer: >60 mL/min (ref >60)
Glucose, Bld: 77 mg/dL (ref 70–99)
POTASSIUM: 3.6 mmol/L (ref 3.5–5.1)
SODIUM: 134 mmol/L — AB (ref 135–145)
Total Protein: 7.1 g/dL (ref 6.5–8.1)

## 2017-06-30 LAB — CBC WITH DIFFERENTIAL/PLATELET
Basophils Absolute: 0 10*3/uL (ref 0–0.1)
Basophils Relative: 1 %
EOS ABS: 0.3 10*3/uL (ref 0–0.7)
Eosinophils Relative: 4 %
HEMATOCRIT: 37.3 % (ref 35.0–47.0)
Hemoglobin: 12.8 g/dL (ref 12.0–16.0)
LYMPHS ABS: 1.6 10*3/uL (ref 1.0–3.6)
Lymphocytes Relative: 24 %
MCH: 30 pg (ref 26.0–34.0)
MCHC: 34.5 g/dL (ref 32.0–36.0)
MCV: 87.2 fL (ref 80.0–100.0)
MONO ABS: 0.4 10*3/uL (ref 0.2–0.9)
Monocytes Relative: 6 %
Neutro Abs: 4.3 10*3/uL (ref 1.4–6.5)
Neutrophils Relative %: 65 %
Platelets: 176 10*3/uL (ref 150–440)
RBC: 4.27 MIL/uL (ref 3.80–5.20)
RDW: 13.3 % (ref 11.5–14.5)
WBC: 6.5 10*3/uL (ref 3.6–11.0)

## 2017-06-30 LAB — LIPASE, BLOOD: LIPASE: 27 U/L (ref 11–51)

## 2017-06-30 LAB — URINALYSIS, COMPLETE (UACMP) WITH MICROSCOPIC
BILIRUBIN URINE: NEGATIVE
Bacteria, UA: NONE SEEN
GLUCOSE, UA: NEGATIVE mg/dL
Hgb urine dipstick: NEGATIVE
KETONES UR: NEGATIVE mg/dL
LEUKOCYTES UA: NEGATIVE
Nitrite: NEGATIVE
PH: 7 (ref 5.0–8.0)
Protein, ur: NEGATIVE mg/dL
Specific Gravity, Urine: 1.019 (ref 1.005–1.030)

## 2017-06-30 MED ORDER — ACETAMINOPHEN 500 MG PO TABS
1000.0000 mg | ORAL_TABLET | Freq: Once | ORAL | Status: AC
  Administered 2017-06-30: 1000 mg via ORAL
  Filled 2017-06-30: qty 2

## 2017-06-30 MED ORDER — FAMOTIDINE IN NACL 20-0.9 MG/50ML-% IV SOLN
20.0000 mg | Freq: Once | INTRAVENOUS | Status: AC
  Administered 2017-06-30: 20 mg via INTRAVENOUS
  Filled 2017-06-30: qty 50

## 2017-06-30 NOTE — ED Provider Notes (Signed)
[facility] Emergency Department Provider Note  ____________________________________________  Time seen: Approximately 7:54 PM  I have reviewed the triage vital signs and the nursing notes.   HISTORY  Chief Complaint Abdominal Pain   HPI Maria Richmond is a 22 y.o. female who presents for evaluation of abdominal pain.  Patient is currently 13 weeks pregnancy.  3 hours ago she developed sudden onset of sharp epigastric abdominal pain associated with nausea and 5 episodes of nonbloody nonbilious emesis.  The pain is 6 out of 10.  She denies fever chills, diarrhea or constipation, vaginal bleeding or vaginal discharge, dysuria or hematuria.  No prior abdominal surgeries other than a C-section.  Past Medical History:  Diagnosis Date  . Depression   . Medical history non-contributory   . Preterm labor     Patient Active Problem List   Diagnosis Date Noted  . Pregnancy complicated by tobacco use in first trimester 06/04/2017  . Short interval between pregnancies affecting pregnancy in first trimester, antepartum 06/04/2017  . Anxiety and depression 06/04/2017  . High risk pregnancy due to history of preterm labor 06/03/2017  . History of placenta abruption 06/25/2016  . History of classical cesarean section 01/31/2016  . History of preterm delivery 09/06/2014    Past Surgical History:  Procedure Laterality Date  . CESAREAN SECTION    . CESAREAN SECTION N/A 07/17/2016   Procedure: CESAREAN SECTION, Female, 6lb, 6oz;  Surgeon: Conard Novak, MD;  Location: [facility] ORS;  Service: Obstetrics;  Laterality: N/A;    Prior to Admission medications   Medication Sig Start Date End Date Taking? Authorizing Provider  hydroxyprogesterone caproate (MAKENA) 250 mg/mL OIL injection Inject 1 mL (250 mg total) into the muscle once a week. 06/04/17   Oswaldo Conroy, CNM  metroNIDAZOLE (FLAGYL) 500 MG tablet Take 1 tablet (500 mg total) by mouth 2 (two) times  daily for 7 days. 06/23/17 06/30/17  Natale Milch, MD  Prenatal Vit-Fe Fumarate-FA (PRENATAL MULTIVITAMIN) TABS tablet Take 1 tablet by mouth daily at 12 noon.    [provider]  Prenatal-DSS-FeCb-FeGl-FA (CITRANATAL BLOOM) 90-1 MG TABS Take 1 tablet by mouth daily. 06/17/17   Natale Milch, MD    Allergies Patient has no known allergies.  Family History  Problem Relation Age of Onset  . Diabetes Maternal Grandmother   . Hypertension Maternal Grandmother   . Heart Problems Maternal Grandmother   . Stroke Maternal Grandmother   . Thyroid disease Maternal Grandmother     Social History Social History   Tobacco Use  . Smoking status: Former Smoker    Packs/day: 1.00    Types: Cigarettes  . Smokeless tobacco: Never Used  Substance Use Topics  . Alcohol use: No  . Drug use: No    Comment: positive uds for MJ, states no use recently    Review of Systems  Constitutional: Negative for fever. Eyes: Negative for visual changes. ENT: Negative for sore throat. Neck: No neck pain  Cardiovascular: Negative for chest pain. Respiratory: Negative for shortness of breath. Gastrointestinal: + abdominal pain, nausea, and vomiting. No diarrhea. Genitourinary: Negative for dysuria. Musculoskeletal: Negative for back pain. Skin: Negative for rash. Neurological: Negative for headaches, weakness or numbness. Psych: No SI or HI  ____________________________________________   PHYSICAL EXAM:  VITAL SIGNS: ED Triage Vitals  Enc Vitals Group     BP 06/30/17 1925 117/80     Pulse Rate 06/30/17 1925 95     Resp 06/30/17 1925 12  Temp 06/30/17 1925 98.5 F (36.9 C)     Temp Source 06/30/17 1925 Oral     SpO2 06/30/17 1925 100 %     Weight 06/30/17 1925 160 lb (72.6 kg)     Height 06/30/17 1925 5\' 2"  (1.575 m)     Head Circumference --      Peak Flow --      Pain Score 06/30/17 1926 8     Pain Loc --      Pain Edu? --      Excl. in GC? --      Constitutional: Alert and oriented. Well appearing and in no apparent distress. HEENT:      Head: Normocephalic and atraumatic.         Eyes: Conjunctivae are normal. Sclera is non-icteric.       Mouth/Throat: Mucous membranes are moist.       Neck: Supple with no signs of meningismus. Cardiovascular: Regular rate and rhythm. No murmurs, gallops, or rubs. 2+ symmetrical distal pulses are present in all extremities. No JVD. Respiratory: Normal respiratory effort. Lungs are clear to auscultation bilaterally. No wheezes, crackles, or rhonchi.  Gastrointestinal: Soft, diffusely tender to palpation worse over the epigastric and RUQ regions, no Murphy's sign, non distended with positive bowel sounds. No rebound or guarding. Genitourinary: No CVA tenderness. Musculoskeletal: Nontender with normal range of motion in all extremities. No edema, cyanosis, or erythema of extremities. Neurologic: Normal speech and language. Face is symmetric. Moving all extremities. No gross focal neurologic deficits are appreciated. Skin: Skin is warm, dry and intact. No rash noted. Psychiatric: Mood and affect are normal. Speech and behavior are normal.  ____________________________________________   LABS (all labs ordered are listed, but only abnormal results are displayed)  Labs Reviewed  COMPREHENSIVE METABOLIC PANEL - Abnormal; Notable for the following components:      Result Value   Sodium 134 (*)    Creatinine, Ser 0.41 (*)    All other components within normal limits  URINALYSIS, COMPLETE (UACMP) WITH MICROSCOPIC - Abnormal; Notable for the following components:   Color, Urine YELLOW (*)    APPearance CLOUDY (*)    All other components within normal limits  CBC WITH DIFFERENTIAL/PLATELET  LIPASE, BLOOD   ____________________________________________  EKG  ED ECG REPORT I, Nita Sickle, the attending physician, personally viewed and interpreted this ECG.  Normal sinus rhythm, rate of 94,  normal intervals, normal axis, no ST elevations or depressions.  Normal EKG. ____________________________________________  RADIOLOGY  I have personally reviewed the images performed during this visit and I agree with the Radiologist's read.   Interpretation by Radiologist:  US Abdomen Limited Ruq  Result Date: 06/30/2017 CLINICAL DATA:  22 year old female with acute abdominal pain for 2 hours. EXAM: ULTRASOUND ABDOMEN LIMITED RIGHT UPPER QUADRANT COMPARISON:  None. FINDINGS: Gallbladder: The gallbladder is unremarkable. There is no evidence of cholelithiasis or acute cholecystitis. Common bile duct: Diameter: 3 mm.  No intrahepatic or extrahepatic biliary dilatation. Liver: No focal lesion identified. Within normal limits in parenchymal echogenicity. Portal vein is patent on color Doppler imaging with normal direction of blood flow towards the liver. IMPRESSION: Normal RIGHT UPPER quadrant abdominal ultrasound. Electronically Signed   By: Harmon Pier M.D.   On: 06/30/2017 21:16      ____________________________________________   PROCEDURES  Procedure(s) performed: None Procedures Critical Care performed:  None ____________________________________________   INITIAL IMPRESSION / ASSESSMENT AND PLAN / ED COURSE   22 y.o. female who presents for evaluation of sharp  sudden onset of epigastric abdominal pain, nausea, and vomiting.  Patient is well-appearing, no distress, she has normal vital signs, abdomen has diffuse tenderness throughout worse in the epigastric and right upper quadrant regions.  There is no rebound or guarding.  Differential diagnosis is broad and includes but not limited to gastritis versus peptic ulcer disease versus pancreatitis versus gallbladder versus urinary tract infection versus appendicitis.  Patient had an ultrasound confirming IUP pregnancy with no evidence of ectopic. Patient is diffusely tender therefore appendicitis is also a possibility. No vaginal bleeding to  suggest miscarriage. No vaginal discharge to suggest PID/ STD. Will give tylenol and zofran, check labs including CBC, CMP, lipase, UA. Doppler showing fetal hear rate of 120  Clinical Course as of Jun 30 2136  Tue Jun 30, 2017  2136 Labs, urine, RUQ US all negative. Patient reports that she feels improved but continues to have mild pain in the epigastric region.  She remains mildly tender diffusely worse on the right upper quadrant and epigastric region.  I explained to the patient that because of her pregnancy the appendix sometimes can move up and at this time we have not ruled out appendicitis.  I recommended an MRI of her abdomen however patient wishes to go home.  She agrees to return to the emergency room if the pain intensifies or if she starts having nausea and vomiting again.  She also agrees to see her primary care doctor or return to the emergency room in the morning if when she wakes up the pain is still present for reevaluation.  I discussed risks of delaying imaging and management in case this is appendicitis which includes perforation, sepsis, miscarriage, and death.  Patient understands and will return to the emergency room for further evaluation as needed.   [CV]    Clinical Course User Index [CV] Don PerkingVeronese, [place], MD     As part of my medical decision making, I reviewed the following data within the electronic MEDICAL RECORD NUMBER Nursing notes reviewed and incorporated, Labs reviewed , Old chart reviewed, Radiograph reviewed , Notes from prior ED visits and [place] Controlled Substance Database    Pertinent labs & imaging results that were available during my care of the patient were reviewed by me and considered in my medical decision making (see chart for details).    ____________________________________________   FINAL CLINICAL IMPRESSION(S) / ED DIAGNOSES  Final diagnoses:  Epigastric abdominal pain  Abdominal pain during pregnancy in second trimester      NEW  MEDICATIONS STARTED DURING THIS VISIT:  ED Discharge Orders    None       Note:  This document was prepared using Dragon voice recognition software and may include unintentional dictation errors.    Nita SickleVeronese, [place], MD 06/30/17 2139

## 2017-06-30 NOTE — ED Triage Notes (Signed)
Pt arrived via [facility] from home with c/o of abdominal pain. PT states that she is [redacted] weeks pregnant on her 3rd pregnancy. Pt states about 3 hours ago she started having sharp abdominal pain as well as N/V. Pt states that she has had a cold for the last 2 days. Pt BS 91. Fetal Heart monitor used for HR of 120 bpm. Pt afebrile on arrival. 4 zofran given by EMS.

## 2017-06-30 NOTE — Discharge Instructions (Addendum)

## 2017-06-30 NOTE — ED Notes (Signed)
Patient transported to Ultrasound 

## 2017-07-01 ENCOUNTER — Ambulatory Visit (INDEPENDENT_AMBULATORY_CARE_PROVIDER_SITE_OTHER): Payer: Medicaid Other | Admitting: Obstetrics and Gynecology

## 2017-07-01 ENCOUNTER — Encounter: Payer: Self-pay | Admitting: Obstetrics and Gynecology

## 2017-07-01 VITALS — BP 96/64 | Wt 156.0 lb

## 2017-07-01 DIAGNOSIS — O99331 Smoking (tobacco) complicating pregnancy, first trimester: Secondary | ICD-10-CM

## 2017-07-01 DIAGNOSIS — F418 Other specified anxiety disorders: Secondary | ICD-10-CM

## 2017-07-01 DIAGNOSIS — O0990 Supervision of high risk pregnancy, unspecified, unspecified trimester: Secondary | ICD-10-CM | POA: Insufficient documentation

## 2017-07-01 DIAGNOSIS — F17210 Nicotine dependence, cigarettes, uncomplicated: Secondary | ICD-10-CM

## 2017-07-01 DIAGNOSIS — F419 Anxiety disorder, unspecified: Secondary | ICD-10-CM

## 2017-07-01 DIAGNOSIS — Z8751 Personal history of pre-term labor: Secondary | ICD-10-CM

## 2017-07-01 DIAGNOSIS — F32A Depression, unspecified: Secondary | ICD-10-CM

## 2017-07-01 DIAGNOSIS — F329 Major depressive disorder, single episode, unspecified: Secondary | ICD-10-CM

## 2017-07-01 DIAGNOSIS — O99341 Other mental disorders complicating pregnancy, first trimester: Secondary | ICD-10-CM

## 2017-07-01 DIAGNOSIS — O09291 Supervision of pregnancy with other poor reproductive or obstetric history, first trimester: Secondary | ICD-10-CM

## 2017-07-01 DIAGNOSIS — O09211 Supervision of pregnancy with history of pre-term labor, first trimester: Secondary | ICD-10-CM

## 2017-07-01 DIAGNOSIS — O34219 Maternal care for unspecified type scar from previous cesarean delivery: Secondary | ICD-10-CM

## 2017-07-01 DIAGNOSIS — Z98891 History of uterine scar from previous surgery: Secondary | ICD-10-CM

## 2017-07-01 DIAGNOSIS — Z8759 Personal history of other complications of pregnancy, childbirth and the puerperium: Secondary | ICD-10-CM

## 2017-07-01 DIAGNOSIS — O09891 Supervision of other high risk pregnancies, first trimester: Secondary | ICD-10-CM

## 2017-07-01 DIAGNOSIS — Z3A12 12 weeks gestation of pregnancy: Secondary | ICD-10-CM

## 2017-07-01 NOTE — Progress Notes (Incomplete)
Routine Prenatal Care Visit  Subjective  Maria Richmond is a 22 y.o. [phone] at [redacted]w[redacted]d being seen today for ongoing prenatal care.  She is currently monitored for the following issues for this {Blank single:19197::"high-risk","low-risk"} pregnancy and has History of classical cesarean section; History of placenta abruption; History of preterm delivery; High risk pregnancy due to history of preterm labor; Pregnancy complicated by tobacco use in first trimester; Short interval between pregnancies affecting pregnancy in first trimester, antepartum; Anxiety and depression; and Supervision of high risk pregnancy, antepartum on their problem list.  ----------------------------------------------------------------------------------- Patient reports {sx:14538}.    . Vag. Bleeding: None.   . Denies leaking of fluid.  ----------------------------------------------------------------------------------- The following portions of the patient's history were reviewed and updated as appropriate: allergies, current medications, past family history, past medical history, past social history, past surgical history and problem list. Problem list updated.   Objective  Blood pressure 96/64, weight 156 lb (70.8 kg), last menstrual period 01/07/2017, unknown if currently breastfeeding. Pregravid weight 160 lb (72.6 kg) Total Weight Gain -4 lb (-1.814 kg) Urinalysis: Urine Protein: 1+ Urine Glucose: Negative  Fetal Status: Fetal Heart Rate (bpm): 155         General:  Alert, oriented and cooperative. Patient is in no acute distress.  Skin: Skin is warm and dry. No rash noted.   Cardiovascular: Normal heart rate noted  Respiratory: Normal respiratory effort, no problems with respiration noted  Abdomen: Soft, gravid, appropriate for gestational age.       Pelvic:  {Blank single:19197::"Cervical exam performed","Cervical exam deferred"}        Extremities: Normal range of motion.     Mental Status: Normal mood  and affect. Normal behavior. Normal judgment and thought content.   Assessment   22 y.o. [id number] at [redacted]w[redacted]d by  01/08/2018, by Ultrasound presenting for {Blank single:19197::"routine","work-in"} prenatal visit  Plan   G4 Problems (from 05/27/17 to present)    Problem Noted Resolved   Supervision of high risk pregnancy, antepartum 07/01/2017 by Conard Novak, MD No   Pregnancy complicated by tobacco use in first trimester 06/04/2017 by Oswaldo Conroy, CNM No   Short interval between pregnancies affecting pregnancy in first trimester, antepartum 06/04/2017 by Oswaldo Conroy, CNM No   Anxiety and depression 06/04/2017 by Oswaldo Conroy, CNM No   High risk pregnancy due to history of preterm labor 06/03/2017 by Oswaldo Conroy, CNM No   Overview Signed 06/03/2017  1:52 PM by Oswaldo Conroy, CNM    Clinic [facility] Prenatal Labs  Dating  Blood type: --/--/O POS (07/11 0934)   Genetic Screen 1 Screen:    AFP:     Quad:     NIPS: Antibody:NEG (07/11 0934)  Anatomic [place]  Rubella:   Varicella:    GTT Early:               Third trimester:  RPR: Non Reactive (07/11 0934)   Rhogam  HBsAg:     TDaP vaccine                       Flu Shot: HIV: NON REACTIVE (07/11 0934)   Baby Food                                GBS:   Contraception  Pap:  CBB     CS/VBAC    Support Person  History of placenta abruption 06/25/2016 by Vena AustriaStaebler, Andreas, MD No   History of classical cesarean section 01/31/2016 by Jimmey RalphLivingston, Elizabeth, MD No   Overview Signed 01/31/2016  5:09 PM by Jimmey RalphLivingston, Elizabeth, MD    22 yo G1P0 admitted at 2650w5d after PPROM.  She was started on latency antibiotics.  Received ICN consult.  She had previously received BMZ and did not received rescue course on admission.  On HD#4 she began having vaginal bleeding.  She was placed on fetal heart rate monitor to ensure fetal well-being.  She continued to have bleeding and new onset abdominal pain.  Placental abruption was  suspected and she underwent urgent Classical cesarean section.  See procedure note for details.    Findings at time of delivery: A female infant in breech presentation.  Amniotic fluid - Clear.  Birth weight 870 g.  Apgars of 6 and 8. Arterial pH = 7.31, Venous pH = 7.32.  Placenta with a three-vessel cord, approximately 30% abruption with evidence of old clots in the uterus. Grossly normal uterus, tubes and ovaries bilaterally. Postpartum course complicated by neonatal demise.  Patient was taken to ICN at the time of coding and elected coding to stop and rather, to hold her baby      History of preterm delivery 09/06/2014 by Tresea MallGledhill, Jane, CNM No   Overview Signed 07/08/2016  3:20 PM by Tresea MallGledhill, Jane, CNM    Overview:  With PPROM at 26 wks. 22 yo G1P0 admitted at 3750w5d after PPROM.  She was started on latency antibiotics.  Received ICN consult.  She had previously received BMZ and did not received rescue course on admission.  On HD#4 she began having vaginal bleeding.  She was placed on fetal heart rate monitor to ensure fetal well-being.  She continued to have bleeding and new onset abdominal pain.  Placental abruption was suspected and she underwent urgent Classical cesarean section.  See procedure note for details.    Findings at time of delivery: A female infant in breech presentation.  Amniotic fluid - Clear.  Birth weight 870 g.  Apgars of 6 and 8. Arterial pH = 7.31, Venous pH = 7.32.  Placenta with a three-vessel cord, approximately 30% abruption with evidence of old clots in the uterus. Grossly normal uterus, tubes and ovaries bilaterally. Postpartum course complicated by neonatal demise.  Patient was taken to ICN at the time of coding and elected coding to stop and rather, to hold her baby          {Blank single:19197::"Term","Preterm"} labor symptoms and general obstetric precautions including but not limited to vaginal bleeding, contractions, leaking of fluid and fetal movement  were reviewed in detail with the patient. Please refer to After Visit Summary for other counseling recommendations.   Return in about 1 month (around 07/29/2017) for Routine Prenatal Appointment.  Thomasene MohairStephen Danali Marinos, MD, Merlinda FrederickFACOG [facility], [facility] 07/01/2017 5:59 PM

## 2017-07-01 NOTE — Progress Notes (Signed)
Routine Prenatal Care Visit  Subjective  Maria Richmond is a 22 y.o. [phone] at [redacted]w[redacted]d being seen today for ongoing prenatal care.  She is currently monitored for the following issues for this high-risk pregnancy and has History of classical cesarean section; History of placenta abruption; History of preterm delivery; High risk pregnancy due to history of preterm labor; Pregnancy complicated by tobacco use in first trimester; Short interval between pregnancies affecting pregnancy in first trimester, antepartum; Anxiety and depression; and Supervision of high risk pregnancy, antepartum on their problem list.  ----------------------------------------------------------------------------------- Patient reports continue to have mid abdominal and epigastric and RUQ pain, improved today, but still present. Denies fevers, chills, nausea, vomiting.    . Vag. Bleeding: None.   . Denies leaking of fluid.  ----------------------------------------------------------------------------------- The following portions of the patient's history were reviewed and updated as appropriate: allergies, current medications, past family history, past medical history, past social history, past surgical history and problem list. Problem list updated.   Objective  Blood pressure 96/64, weight 156 lb (70.8 kg), last menstrual period 01/07/2017, unknown if currently breastfeeding. Pregravid weight 160 lb (72.6 kg) Total Weight Gain -4 lb (-1.814 kg) Urinalysis: Urine Protein: 1+ Urine Glucose: Negative  Fetal Status: Fetal Heart Rate (bpm): 155         General:  Alert, oriented and cooperative. Patient is in no acute distress.  Skin: Skin is warm and dry. No rash noted.   Cardiovascular: Normal heart rate noted  Respiratory: Normal respiratory effort, no problems with respiration noted  Abdomen: Soft, gravid, appropriate for gestational age.     Tender in right flank and right of umbilicus.  Pelvic:  Cervical exam  deferred        Extremities: Normal range of motion.     Mental Status: Normal mood and affect. Normal behavior. Normal judgment and thought content.   Assessment   22 y.o. [id number] at [redacted]w[redacted]d by  01/08/2018, by Ultrasound presenting for routine prenatal visit  Plan   G4 Problems (from 05/27/17 to present)    Problem Noted Resolved   Supervision of high risk pregnancy, antepartum 07/01/2017 by Conard Novak, MD No   Pregnancy complicated by tobacco use in first trimester 06/04/2017 by Oswaldo Conroy, CNM No   Short interval between pregnancies affecting pregnancy in first trimester, antepartum 06/04/2017 by Oswaldo Conroy, CNM No   Anxiety and depression 06/04/2017 by Oswaldo Conroy, CNM No   High risk pregnancy due to history of preterm labor 06/03/2017 by Oswaldo Conroy, CNM No   Overview Signed 06/03/2017  1:52 PM by Oswaldo Conroy, CNM    [facility]inic [facility] Prenatal Labs  Dating  Blood type: --/--/O POS (07/11 0934)   Genetic Screen 1 Screen:    AFP:     Quad:     NIPS: Antibody:NEG (07/11 0934)  Anatomic [place]  Rubella:   Varicella:    GTT Early:               Third trimester:  RPR: Non Reactive (07/11 0934)   Rhogam  HBsAg:     TDaP vaccine                       Flu Shot: HIV: NON REACTIVE (07/11 0934)   Baby Food                                GBS:  Contraception  Pap:  CBB     CS/VBAC    Support Person              History of placenta abruption 06/25/2016 by Vena AustriaStaebler, Andreas, MD No   History of classical cesarean section 01/31/2016 by Jimmey RalphLivingston, Elizabeth, MD No   Overview Signed 01/31/2016  5:09 PM by Jimmey RalphLivingston, Elizabeth, MD    22 yo G1P0 admitted at 2538w5d after PPROM.  She was started on latency antibiotics.  Received ICN consult.  She had previously received BMZ and did not received rescue course on admission.  On HD#4 she began having vaginal bleeding.  She was placed on fetal heart rate monitor to ensure fetal well-being.  She continued to have bleeding and  new onset abdominal pain.  Placental abruption was suspected and she underwent urgent Classical cesarean section.  See procedure note for details.    Findings at time of delivery: A female infant in breech presentation.  Amniotic fluid - Clear.  Birth weight 870 g.  Apgars of 6 and 8. Arterial pH = 7.31, Venous pH = 7.32.  Placenta with a three-vessel cord, approximately 30% abruption with evidence of old clots in the uterus. Grossly normal uterus, tubes and ovaries bilaterally. Postpartum course complicated by neonatal demise.  Patient was taken to ICN at the time of coding and elected coding to stop and rather, to hold her baby      History of preterm delivery 09/06/2014 by Tresea MallGledhill, Jane, CNM No   Overview Signed 07/08/2016  3:20 PM by Tresea MallGledhill, Jane, CNM    Overview:  With PPROM at 26 wks. 22 yo G1P0 admitted at 4238w5d after PPROM.  She was started on latency antibiotics.  Received ICN consult.  She had previously received BMZ and did not received rescue course on admission.  On HD#4 she began having vaginal bleeding.  She was placed on fetal heart rate monitor to ensure fetal well-being.  She continued to have bleeding and new onset abdominal pain.  Placental abruption was suspected and she underwent urgent Classical cesarean section.  See procedure note for details.    Findings at time of delivery: A female infant in breech presentation.  Amniotic fluid - Clear.  Birth weight 870 g.  Apgars of 6 and 8. Arterial pH = 7.31, Venous pH = 7.32.  Placenta with a three-vessel cord, approximately 30% abruption with evidence of old clots in the uterus. Grossly normal uterus, tubes and ovaries bilaterally. Postpartum course complicated by neonatal demise.  Patient was taken to ICN at the time of coding and elected coding to stop and rather, to hold her baby          Preterm labor symptoms and general obstetric precautions including but not limited to vaginal bleeding, contractions, leaking of fluid  and fetal movement were reviewed in detail with the patient. Please refer to After Visit Summary for other counseling recommendations.   - counseled regarding risk of appendicitis given location of pain. She declined an MRI in the hospital last night. I encourged her to return to hospital ASAP, if pain returned and to have MRI performed.  Discussed danger of ruptured appendix whether pregnant or not. She voiced understanding and agreement - may try Zantac or PPI for possible gastritis, as she did get some relief from GI cocktail in ER.  Return in about 1 month (around 07/29/2017) for Routine Prenatal Appointment.  Thomasene MohairStephen Areatha Kalata, MD, Merlinda FrederickFACOG [facility], [facility] 07/01/2017 5:59 PM

## 2017-07-22 ENCOUNTER — Telehealth: Payer: Self-pay | Admitting: Obstetrics and Gynecology

## 2017-07-22 NOTE — Telephone Encounter (Signed)
Patient is schedule 07/24/17

## 2017-07-22 NOTE — Telephone Encounter (Signed)
-----   Message from Mohawk Vistarystal Stanley, LPN sent at 1/61/09607/17/2019 11:46 AM EDT ----- Regarding: FW: 17p Sorry Huntley DecSara! She will be 16 wks on Friday. Her apt would need to be Friday. Thanks ----- Message ----- From: Rocco SereneStanley, Crystal, LPN Sent: 4/54/09817/17/2019  11:43 AM To: Sung AmabileSara E Paschal Subject: 17p                                            Pt's 17p has arrived. She will be 16 wks tomorrow which is when she is to start receiving injections. Please contact pt to set up apt for Nurse visit to receive 17 P tomorrow. Thanks!

## 2017-07-24 ENCOUNTER — Ambulatory Visit (INDEPENDENT_AMBULATORY_CARE_PROVIDER_SITE_OTHER): Payer: Medicaid Other

## 2017-07-24 DIAGNOSIS — O09212 Supervision of pregnancy with history of pre-term labor, second trimester: Secondary | ICD-10-CM

## 2017-07-24 DIAGNOSIS — Z3A15 15 weeks gestation of pregnancy: Secondary | ICD-10-CM | POA: Diagnosis not present

## 2017-07-24 DIAGNOSIS — Z8751 Personal history of pre-term labor: Secondary | ICD-10-CM

## 2017-07-24 MED ORDER — HYDROXYPROGESTERONE CAPROATE 250 MG/ML IM OIL
250.0000 mg | TOPICAL_OIL | Freq: Once | INTRAMUSCULAR | Status: AC
  Administered 2017-07-24: 250 mg via INTRAMUSCULAR

## 2017-07-29 ENCOUNTER — Encounter: Payer: Medicaid Other | Admitting: Obstetrics and Gynecology

## 2017-07-30 ENCOUNTER — Ambulatory Visit (INDEPENDENT_AMBULATORY_CARE_PROVIDER_SITE_OTHER): Payer: Medicaid Other | Admitting: Certified Nurse Midwife

## 2017-07-30 ENCOUNTER — Encounter: Payer: Self-pay | Admitting: Certified Nurse Midwife

## 2017-07-30 VITALS — BP 110/60 | Wt 158.0 lb

## 2017-07-30 DIAGNOSIS — Z363 Encounter for antenatal screening for malformations: Secondary | ICD-10-CM

## 2017-07-30 DIAGNOSIS — O0990 Supervision of high risk pregnancy, unspecified, unspecified trimester: Secondary | ICD-10-CM

## 2017-07-30 DIAGNOSIS — O09212 Supervision of pregnancy with history of pre-term labor, second trimester: Secondary | ICD-10-CM

## 2017-07-30 DIAGNOSIS — O10212 Pre-existing hypertensive chronic kidney disease complicating pregnancy, second trimester: Secondary | ICD-10-CM

## 2017-07-30 DIAGNOSIS — Z8751 Personal history of pre-term labor: Secondary | ICD-10-CM

## 2017-07-30 DIAGNOSIS — Z3A16 16 weeks gestation of pregnancy: Secondary | ICD-10-CM | POA: Diagnosis not present

## 2017-07-30 DIAGNOSIS — F1290 Cannabis use, unspecified, uncomplicated: Secondary | ICD-10-CM

## 2017-07-30 MED ORDER — HYDROXYPROGESTERONE CAPROATE 250 MG/ML IM OIL
250.0000 mg | TOPICAL_OIL | Freq: Once | INTRAMUSCULAR | Status: AC
  Administered 2017-07-30: 250 mg via INTRAMUSCULAR

## 2017-07-30 NOTE — Progress Notes (Signed)
No concerns.rj 

## 2017-07-30 NOTE — Progress Notes (Signed)
Pt is asking for a UDS to be done.rj

## 2017-07-30 NOTE — Progress Notes (Signed)
HROB at 16wk 6 days. HROB for hx of PPROM (and neonatal demise) at 26 weeks, Classical incision with first CS, LTCS to deliver second baby, +MJ screen with NOB, close spacing of pregnancies (third pregnancy in 3 years) S: feeling intermittent flutters. Would like drug screen done because "I have been around it." Denies drug use currently Started 17 P injections last week. O: FH at U-1FB, FCA 139 BPM Materniti 21 was diploid XX A: IUP at 16wk6 days Hx of PPROM P: 17 P today Declines MSAFP Anatomy scan and ROB in 3 weeks Continue weekly 17 P injections UDS  Maria Richmond, CNM

## 2017-07-31 ENCOUNTER — Ambulatory Visit: Payer: Medicaid Other

## 2017-07-31 LAB — URINE DRUG PANEL 7
Amphetamines, Urine: NEGATIVE ng/mL
Barbiturate Quant, Ur: NEGATIVE ng/mL
Benzodiazepine Quant, Ur: NEGATIVE ng/mL
Cannabinoid Quant, Ur: NEGATIVE ng/mL
Cocaine (Metab.): NEGATIVE ng/mL
OPIATE QUANT UR: NEGATIVE ng/mL
PCP Quant, Ur: NEGATIVE ng/mL

## 2017-08-06 ENCOUNTER — Ambulatory Visit: Payer: Medicaid Other

## 2017-08-13 ENCOUNTER — Ambulatory Visit (INDEPENDENT_AMBULATORY_CARE_PROVIDER_SITE_OTHER): Payer: Medicaid Other

## 2017-08-13 DIAGNOSIS — Z8751 Personal history of pre-term labor: Secondary | ICD-10-CM

## 2017-08-13 DIAGNOSIS — O09212 Supervision of pregnancy with history of pre-term labor, second trimester: Secondary | ICD-10-CM | POA: Diagnosis not present

## 2017-08-13 DIAGNOSIS — Z3A18 18 weeks gestation of pregnancy: Secondary | ICD-10-CM

## 2017-08-13 MED ORDER — HYDROXYPROGESTERONE CAPROATE 250 MG/ML IM OIL
250.0000 mg | TOPICAL_OIL | Freq: Once | INTRAMUSCULAR | Status: AC
  Administered 2017-08-13: 250 mg via INTRAMUSCULAR

## 2017-08-13 NOTE — Progress Notes (Signed)
ma

## 2017-08-20 ENCOUNTER — Ambulatory Visit (INDEPENDENT_AMBULATORY_CARE_PROVIDER_SITE_OTHER): Payer: Medicaid Other | Admitting: Obstetrics and Gynecology

## 2017-08-20 ENCOUNTER — Ambulatory Visit (INDEPENDENT_AMBULATORY_CARE_PROVIDER_SITE_OTHER): Payer: Medicaid Other

## 2017-08-20 ENCOUNTER — Encounter: Payer: Self-pay | Admitting: Obstetrics and Gynecology

## 2017-08-20 VITALS — BP 110/60 | Wt 164.0 lb

## 2017-08-20 DIAGNOSIS — F329 Major depressive disorder, single episode, unspecified: Secondary | ICD-10-CM

## 2017-08-20 DIAGNOSIS — Z8751 Personal history of pre-term labor: Secondary | ICD-10-CM

## 2017-08-20 DIAGNOSIS — O09892 Supervision of other high risk pregnancies, second trimester: Secondary | ICD-10-CM

## 2017-08-20 DIAGNOSIS — O34219 Maternal care for unspecified type scar from previous cesarean delivery: Secondary | ICD-10-CM

## 2017-08-20 DIAGNOSIS — F418 Other specified anxiety disorders: Secondary | ICD-10-CM

## 2017-08-20 DIAGNOSIS — Z363 Encounter for antenatal screening for malformations: Secondary | ICD-10-CM | POA: Diagnosis not present

## 2017-08-20 DIAGNOSIS — O99332 Smoking (tobacco) complicating pregnancy, second trimester: Secondary | ICD-10-CM

## 2017-08-20 DIAGNOSIS — O09212 Supervision of pregnancy with history of pre-term labor, second trimester: Secondary | ICD-10-CM | POA: Diagnosis not present

## 2017-08-20 DIAGNOSIS — O09891 Supervision of other high risk pregnancies, first trimester: Secondary | ICD-10-CM

## 2017-08-20 DIAGNOSIS — Z3A19 19 weeks gestation of pregnancy: Secondary | ICD-10-CM | POA: Diagnosis not present

## 2017-08-20 DIAGNOSIS — O0990 Supervision of high risk pregnancy, unspecified, unspecified trimester: Secondary | ICD-10-CM

## 2017-08-20 DIAGNOSIS — O99342 Other mental disorders complicating pregnancy, second trimester: Secondary | ICD-10-CM

## 2017-08-20 DIAGNOSIS — O99331 Smoking (tobacco) complicating pregnancy, first trimester: Secondary | ICD-10-CM

## 2017-08-20 DIAGNOSIS — Z8759 Personal history of other complications of pregnancy, childbirth and the puerperium: Secondary | ICD-10-CM

## 2017-08-20 DIAGNOSIS — F419 Anxiety disorder, unspecified: Secondary | ICD-10-CM

## 2017-08-20 DIAGNOSIS — O09292 Supervision of pregnancy with other poor reproductive or obstetric history, second trimester: Secondary | ICD-10-CM

## 2017-08-20 DIAGNOSIS — Z98891 History of uterine scar from previous surgery: Secondary | ICD-10-CM

## 2017-08-20 LAB — POCT URINALYSIS DIPSTICK OB
GLUCOSE, UA: NEGATIVE — AB
PROTEIN: NEGATIVE

## 2017-08-20 MED ORDER — HYDROXYPROGESTERONE CAPROATE 250 MG/ML IM OIL
250.0000 mg | TOPICAL_OIL | Freq: Once | INTRAMUSCULAR | Status: AC
  Administered 2017-08-20: 250 mg via INTRAMUSCULAR

## 2017-08-20 NOTE — Progress Notes (Signed)
Routine Prenatal Care Visit  Subjective  Maria Richmond is a 22 y.o. [phone] at 4474w6d being seen today for ongoing prenatal care.  She is currently monitored for the following issues for this high-risk pregnancy and has History of classical cesarean section; History of placenta abruption; History of preterm delivery; High risk pregnancy due to history of preterm labor; Pregnancy complicated by tobacco use in first trimester; Short interval between pregnancies affecting pregnancy in first trimester, antepartum; Anxiety and depression; and Supervision of high risk pregnancy, antepartum on their problem list.  ----------------------------------------------------------------------------------- Patient reports no complaints.   Contractions: Not present. Vag. Bleeding: None.  Movement: Present. Denies leaking of fluid.  ----------------------------------------------------------------------------------- The following portions of the patient's history were reviewed and updated as appropriate: allergies, current medications, past family history, past medical history, past social history, past surgical history and problem list. Problem list updated.   Objective  Blood pressure 110/60, weight 164 lb (74.4 kg), last menstrual period 01/07/2017, unknown if currently breastfeeding. Pregravid weight 160 lb (72.6 kg) Total Weight Gain 4 lb (1.814 kg) Urinalysis:      Fetal Status: Fetal Heart Rate (bpm): 134   Movement: Present     General:  Alert, oriented and cooperative. Patient is in no acute distress.  Skin: Skin is warm and dry. No rash noted.   Cardiovascular: Normal heart rate noted  Respiratory: Normal respiratory effort, no problems with respiration noted  Abdomen: Soft, gravid, appropriate for gestational age. Pain/Pressure: Absent     Pelvic:  Cervical exam deferred        Extremities: Normal range of motion.     ental Status: Normal mood and affect. Normal behavior. Normal judgment  and thought content.     Assessment   22 y.o. A5W0981G4P1111 at 6574w6d by  01/08/2018, by Ultrasound presenting for routine prenatal visit  Plan   G4 Problems (from 05/27/17 to present)    Problem Noted Resolved   Supervision of high risk pregnancy, antepartum 07/01/2017 by Conard NovakJackson, Stephen D, MD No   Pregnancy complicated by tobacco use in first trimester 06/04/2017 by Oswaldo ConroySchmid, Jacelyn Y, CNM No   Short interval between pregnancies affecting pregnancy in first trimester, antepartum 06/04/2017 by Oswaldo ConroySchmid, Jacelyn Y, CNM No   Anxiety and depression 06/04/2017 by Oswaldo ConroySchmid, Jacelyn Y, CNM No   High risk pregnancy due to history of preterm labor 06/03/2017 by Oswaldo ConroySchmid, Jacelyn Y, CNM No   Overview Addendum 08/20/2017 12:10 PM by Natale MilchSchuman, Christanna R, MD    Clinic [facility] Prenatal Labs  Dating  7 wk US Blood type: O/Positive/-- (05/29 1422)   Genetic Screen  NIPS: normal Antibody:Negative (05/29 1422)  Anatomic US incomplete Rubella: <0.90 (05/29 1422) Nonimmune Varicella:  NonImmune  GTT Early:                Third trimester:  RPR: Non Reactive (05/29 1422)   Rhogam Not needed HBsAg: Negative (05/29 1422)   TDaP vaccine                       Flu Shot: HIV: Non Reactive (05/29 1422)   Baby Food                                GBS:   Contraception  Pap: 2019 NIL  CBB     CS/VBAC  CESAREAN @37  weeks   Support Person  [place] (FOB)  History of placenta abruption 06/25/2016 by Vena AustriaStaebler, Andreas, MD No   History of classical cesarean section 01/31/2016 by Jimmey RalphLivingston, Elizabeth, MD No   Overview Signed 01/31/2016  5:09 PM by Jimmey RalphLivingston, Elizabeth, MD    22 yo G1P0 admitted at 5632w5d after PPROM.  She was started on latency antibiotics.  Received ICN consult.  She had previously received BMZ and did not received rescue course on admission.  On HD#4 she began having vaginal bleeding.  She was placed on fetal heart rate monitor to ensure fetal well-being.  She continued to have bleeding and new onset  abdominal pain.  Placental abruption was suspected and she underwent urgent Classical cesarean section.  See procedure note for details.    Findings at time of delivery: A female infant in breech presentation.  Amniotic fluid - Clear.  Birth weight 870 g.  Apgars of 6 and 8. Arterial pH = 7.31, Venous pH = 7.32.  Placenta with a three-vessel cord, approximately 30% abruption with evidence of old clots in the uterus. Grossly normal uterus, tubes and ovaries bilaterally. Postpartum course complicated by neonatal demise.  Patient was taken to ICN at the time of coding and elected coding to stop and rather, to hold her baby      History of preterm delivery 09/06/2014 by Tresea MallGledhill, Jane, CNM No   Overview Signed 07/08/2016  3:20 PM by Tresea MallGledhill, Jane, CNM    Overview:  With PPROM at 26 wks. 22 yo G1P0 admitted at 2332w5d after PPROM.  She was started on latency antibiotics.  Received ICN consult.  She had previously received BMZ and did not received rescue course on admission.  On HD#4 she began having vaginal bleeding.  She was placed on fetal heart rate monitor to ensure fetal well-being.  She continued to have bleeding and new onset abdominal pain.  Placental abruption was suspected and she underwent urgent Classical cesarean section.  See procedure note for details.    Findings at time of delivery: A female infant in breech presentation.  Amniotic fluid - Clear.  Birth weight 870 g.  Apgars of 6 and 8. Arterial pH = 7.31, Venous pH = 7.32.  Placenta with a three-vessel cord, approximately 30% abruption with evidence of old clots in the uterus. Grossly normal uterus, tubes and ovaries bilaterally. Postpartum course complicated by neonatal demise.  Patient was taken to ICN at the time of coding and elected coding to stop and rather, to hold her baby          Gestational age appropriate obstetric precautions including but not limited to vaginal bleeding, contractions, leaking of fluid and fetal  movement were reviewed in detail with the patient.    17P injection today.  Anatomy US incomplete.   Return in about 1 week (around 08/27/2017) for weekly for 17P injections, ROB and US in 4 weeks.  Adelene Idlerhristanna Schuman MD [facility], [facility] 08/20/17 12:13 PM

## 2017-08-20 NOTE — Progress Notes (Signed)
ROB Anatomy scan/ It is a girl! 17P given  Pt requesting UDS d/t being on probation

## 2017-08-21 LAB — URINE DRUG PANEL 7
AMPHETAMINES, URINE: NEGATIVE ng/mL
BARBITURATE QUANT UR: NEGATIVE ng/mL
Benzodiazepine Quant, Ur: NEGATIVE ng/mL
COCAINE (METAB.): NEGATIVE ng/mL
Cannabinoid Quant, Ur: NEGATIVE ng/mL
Opiate Quant, Ur: NEGATIVE ng/mL
PCP Quant, Ur: NEGATIVE ng/mL

## 2017-08-24 ENCOUNTER — Observation Stay
Admission: EM | Admit: 2017-08-24 | Discharge: 2017-08-24 | Disposition: A | Discharge status: Home / Self Care | Payer: Medicaid Other | Attending: Obstetrics and Gynecology | Admitting: Obstetrics and Gynecology

## 2017-08-24 DIAGNOSIS — R102 Pelvic and perineal pain: Secondary | ICD-10-CM

## 2017-08-24 DIAGNOSIS — Z87891 Personal history of nicotine dependence: Secondary | ICD-10-CM | POA: Insufficient documentation

## 2017-08-24 DIAGNOSIS — R1030 Lower abdominal pain, unspecified: Secondary | ICD-10-CM | POA: Diagnosis not present

## 2017-08-24 DIAGNOSIS — O26899 Other specified pregnancy related conditions, unspecified trimester: Secondary | ICD-10-CM

## 2017-08-24 DIAGNOSIS — O99342 Other mental disorders complicating pregnancy, second trimester: Secondary | ICD-10-CM | POA: Insufficient documentation

## 2017-08-24 DIAGNOSIS — O09891 Supervision of other high risk pregnancies, first trimester: Secondary | ICD-10-CM

## 2017-08-24 DIAGNOSIS — O99331 Smoking (tobacco) complicating pregnancy, first trimester: Secondary | ICD-10-CM

## 2017-08-24 DIAGNOSIS — F329 Major depressive disorder, single episode, unspecified: Secondary | ICD-10-CM | POA: Diagnosis not present

## 2017-08-24 DIAGNOSIS — Z3A20 20 weeks gestation of pregnancy: Secondary | ICD-10-CM | POA: Insufficient documentation

## 2017-08-24 DIAGNOSIS — O0990 Supervision of high risk pregnancy, unspecified, unspecified trimester: Secondary | ICD-10-CM

## 2017-08-24 DIAGNOSIS — F419 Anxiety disorder, unspecified: Secondary | ICD-10-CM

## 2017-08-24 DIAGNOSIS — O09212 Supervision of pregnancy with history of pre-term labor, second trimester: Secondary | ICD-10-CM

## 2017-08-24 DIAGNOSIS — Z8759 Personal history of other complications of pregnancy, childbirth and the puerperium: Secondary | ICD-10-CM

## 2017-08-24 DIAGNOSIS — Z98891 History of uterine scar from previous surgery: Secondary | ICD-10-CM

## 2017-08-24 DIAGNOSIS — O26892 Other specified pregnancy related conditions, second trimester: Secondary | ICD-10-CM | POA: Diagnosis present

## 2017-08-24 DIAGNOSIS — Z8751 Personal history of pre-term labor: Secondary | ICD-10-CM

## 2017-08-24 NOTE — OB Triage Note (Signed)
Pt reports onset of abdominal pain approx 1 hour ago, she also reports similar pain on Saturday which lasted approx. 1 hour. She denies bleeding, LOF, reports positive fetal movement. Abdomen is soft, on-tender. Has eaten regular diet today, no N/V/D. FHT 145 with doppler

## 2017-08-24 NOTE — Discharge Summary (Signed)
Physician Final Progress Note  Patient ID: Maria Richmond MRN: [id number] DOB/AGE: 22/13/1997 22 y.o.  Admit date: 08/24/2017 Admitting provider: Conard Novak, MD Discharge date: 08/24/2017   Admission Diagnoses: bilateral lower abdominal pain  Discharge Diagnoses:  Active Problems:   Indication for care in labor and delivery, antepartum   Pain of round ligament during pregnancy 22 yo G4 P1111 female at 20 weeks 3 days gestation with positive fetal heart tones  History of Present Illness: The patient is a 22 y.o. female [phone] at [redacted]w[redacted]d who presents for pain in her lower bilateral abdomen that has been coming and going today. She feels it for about 30 minutes at a time throughout the day today. She also felt the same pain on Saturday 2 days ago. She reports positive fetal movement. She denies contractions, leakage of fluid or vaginal bleeding. 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 Recommended she rest for the remainder of today, use heat to the affected areas, and to avoid lifting more than 25 pounds.    Past Medical History:  Diagnosis Date  . Depression   . Medical history non-contributory   . Preterm labor     Past Surgical History:  Procedure Laterality Date  . CESAREAN SECTION    . CESAREAN SECTION N/A 07/17/2016   Procedure: CESAREAN SECTION, Female, 6lb, 6oz;  Surgeon: Conard Novak, MD;  Location: [facility] ORS;  Service: Obstetrics;  Laterality: N/A;    No current facility-administered medications on file prior to encounter.    Current Outpatient Medications on File Prior to Encounter  Medication Sig Dispense Refill  . hydroxyprogesterone caproate (MAKENA) 250 mg/mL OIL injection Inject 1 mL (250 mg total) into the muscle once a week. 1 mL 20  . Prenatal Vit-Fe Fumarate-FA (PRENATAL MULTIVITAMIN) TABS tablet Take 1 tablet by mouth daily at 12 noon.    . Prenatal-DSS-FeCb-FeGl-FA (CITRANATAL BLOOM) 90-1 MG TABS Take 1  tablet by mouth daily. 30 tablet 11    No Known Allergies  Social History   Socioeconomic History  . Marital status: Single    Spouse name: Not on file  . Number of children: 1  . Years of education: 62  . Highest education level: Not on file  Occupational History  . Occupation: UNEMPLOYED  Social Needs  . Financial resource strain: Not on file  . Food insecurity:    Worry: Not on file    Inability: Not on file  . Transportation needs:    Medical: Not on file    Non-medical: Not on file  Tobacco Use  . Smoking status: Former Smoker    Packs/day: 1.00    Types: Cigarettes  . Smokeless tobacco: Never Used  Substance and Sexual Activity  . Alcohol use: No  . Drug use: No    Comment: positive uds for MJ, states no use recently  . Sexual activity: Yes    Birth control/protection: None  Lifestyle  . Physical activity:    Days per week: Not on file    Minutes per session: Not on file  . Stress: Not on file  Relationships  . Social connections:    Talks on phone: Not on file    Gets together: Not on file    Attends religious service: Not on file    Active member of club or organization: Not on file    Attends meetings of clubs or organizations: Not on file    Relationship status:  Not on file  . Intimate partner violence:    Fear of current or ex partner: Not on file    Emotionally abused: Not on file    Physically abused: Not on file    Forced sexual activity: Not on file  Other Topics Concern  . Not on file  Social History Narrative   ** Merged History Encounter **        Physical Exam: LMP 01/07/2017   Gen: NAD CV: RRR Pulm: CTAB Pelvic: deferred Toco: no contractions Fetal well being: doppler heart tones 145 Ext: no evidence of DVT  Consults: None  Significant Findings/ Diagnostic Studies: none  Procedures: NST  Discharge Condition: good  Disposition: Discharge disposition: 01-Home or Self Care       Diet: Regular diet  Discharge  Activity: Activity as tolerated and rest for the remainder of today.  Discharge Instructions    Discharge activity:   Complete by:  As directed    Lifting restrictions 25 pounds. Use proper body mechanics when lifting. Use heating pad for round ligament pain.   Discharge diet:  No restrictions   Complete by:  As directed    No sexual activity restrictions   Complete by:  As directed    Notify physician for a general feeling that "something is not right"   Complete by:  As directed    Notify physician for increase or change in vaginal discharge   Complete by:  As directed    Notify physician for intestinal cramps, with or without diarrhea, sometimes described as "gas pain"   Complete by:  As directed    Notify physician for leaking of fluid   Complete by:  As directed    Notify physician for low, dull backache, unrelieved by heat or Tylenol   Complete by:  As directed    Notify physician for menstrual like cramps   Complete by:  As directed    Notify physician for pelvic pressure   Complete by:  As directed    Notify physician for uterine contractions.  These may be painless and feel like the uterus is tightening or the baby is  "balling up"   Complete by:  As directed    Notify physician for vaginal bleeding   Complete by:  As directed    PRETERM LABOR:  Includes any of the follwing symptoms that occur between 20 - [redacted] weeks gestation.  If these symptoms are not stopped, preterm labor can result in preterm delivery, placing your baby at risk   Complete by:  As directed      Allergies as of 08/24/2017   No Known Allergies     Medication List    TAKE these medications   CITRANATAL BLOOM 90-1 MG Tabs Take 1 tablet by mouth daily.   hydroxyprogesterone caproate 250 mg/mL Oil injection Commonly known as:  MAKENA Inject 1 mL (250 mg total) into the muscle once a week.   prenatal multivitamin Tabs tablet Take 1 tablet by mouth daily at 12 noon.      Follow-up Information     [facility]. Go to.   Specialty:  Obstetrics and Gynecology Why:  regular scheduled prenatal appointment Contact information: [address] [place] [address] [phone]          Total time spent taking care of this patient: 15 minutes  Signed: Tresea Mall, CNM  08/24/2017, 6:40 PM

## 2017-08-24 NOTE — Discharge Instructions (Signed)

## 2017-08-25 ENCOUNTER — Observation Stay
Admission: EM | Admit: 2017-08-25 | Discharge: 2017-08-25 | Disposition: A | Discharge status: Home / Self Care | Payer: No Typology Code available for payment source | Attending: Obstetrics & Gynecology | Admitting: Obstetrics & Gynecology

## 2017-08-25 ENCOUNTER — Other Ambulatory Visit: Payer: Self-pay

## 2017-08-25 ENCOUNTER — Encounter: Payer: Self-pay | Admitting: *Deleted

## 2017-08-25 ENCOUNTER — Observation Stay
Admission: EM | Admit: 2017-08-25 | Discharge: 2017-08-26 | Disposition: A | Discharge status: Home / Self Care | Payer: Medicaid Other | Attending: Obstetrics & Gynecology | Admitting: Obstetrics & Gynecology

## 2017-08-25 DIAGNOSIS — O09212 Supervision of pregnancy with history of pre-term labor, second trimester: Secondary | ICD-10-CM | POA: Diagnosis not present

## 2017-08-25 DIAGNOSIS — Z3A20 20 weeks gestation of pregnancy: Secondary | ICD-10-CM | POA: Diagnosis not present

## 2017-08-25 DIAGNOSIS — Z8751 Personal history of pre-term labor: Secondary | ICD-10-CM

## 2017-08-25 DIAGNOSIS — Z98891 History of uterine scar from previous surgery: Secondary | ICD-10-CM

## 2017-08-25 DIAGNOSIS — Z87891 Personal history of nicotine dependence: Secondary | ICD-10-CM | POA: Diagnosis not present

## 2017-08-25 DIAGNOSIS — F329 Major depressive disorder, single episode, unspecified: Secondary | ICD-10-CM

## 2017-08-25 DIAGNOSIS — O9989 Other specified diseases and conditions complicating pregnancy, childbirth and the puerperium: Principal | ICD-10-CM | POA: Insufficient documentation

## 2017-08-25 DIAGNOSIS — M545 Low back pain: Secondary | ICD-10-CM | POA: Insufficient documentation

## 2017-08-25 DIAGNOSIS — Z041 Encounter for examination and observation following transport accident: Secondary | ICD-10-CM | POA: Diagnosis present

## 2017-08-25 DIAGNOSIS — O0990 Supervision of high risk pregnancy, unspecified, unspecified trimester: Secondary | ICD-10-CM

## 2017-08-25 DIAGNOSIS — F419 Anxiety disorder, unspecified: Secondary | ICD-10-CM

## 2017-08-25 DIAGNOSIS — O09891 Supervision of other high risk pregnancies, first trimester: Secondary | ICD-10-CM

## 2017-08-25 DIAGNOSIS — O99331 Smoking (tobacco) complicating pregnancy, first trimester: Secondary | ICD-10-CM

## 2017-08-25 DIAGNOSIS — Z8759 Personal history of other complications of pregnancy, childbirth and the puerperium: Secondary | ICD-10-CM

## 2017-08-25 LAB — CBC
HCT: 32.5 % — ABNORMAL LOW (ref 35.0–47.0)
HEMOGLOBIN: 11.3 g/dL — AB (ref 12.0–16.0)
MCH: 30.2 pg (ref 26.0–34.0)
MCHC: 34.9 g/dL (ref 32.0–36.0)
MCV: 86.7 fL (ref 80.0–100.0)
Platelets: 182 10*3/uL (ref 150–440)
RBC: 3.75 MIL/uL — AB (ref 3.80–5.20)
RDW: 13.1 % (ref 11.5–14.5)
WBC: 7.6 10*3/uL (ref 3.6–11.0)

## 2017-08-25 LAB — PROTIME-INR
INR: 0.97
Prothrombin Time: 12.8 seconds (ref 11.4–15.2)

## 2017-08-25 LAB — APTT: APTT: 31 s (ref 24–36)

## 2017-08-25 MED ORDER — ONDANSETRON HCL 4 MG/2ML IJ SOLN: 4.0000 mg | Freq: Four times a day (QID) | INTRAMUSCULAR | Status: DC | PRN

## 2017-08-25 MED ORDER — ACETAMINOPHEN 325 MG PO TABS: 650.0000 mg | ORAL_TABLET | ORAL | Status: DC | PRN

## 2017-08-25 MED ORDER — LACTATED RINGERS IV SOLN: 500.0000 mL | INTRAVENOUS | Status: DC | PRN

## 2017-08-25 MED ORDER — OXYTOCIN 40 UNITS IN LACTATED RINGERS INFUSION - SIMPLE MED: 2.5000 [IU]/h | INTRAVENOUS | Status: DC

## 2017-08-25 MED ORDER — LACTATED RINGERS IV SOLN: INTRAVENOUS | Status: DC

## 2017-08-25 MED ORDER — OXYTOCIN BOLUS FROM INFUSION: 500.0000 mL | Freq: Once | INTRAVENOUS | Status: DC

## 2017-08-25 NOTE — Discharge Instructions (Addendum)
Please seek medical attention for any high fevers, chest pain, shortness of breath, change in behavior, persistent vomiting, bloody stool or any other new or concerning symptoms.  
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
## 2017-08-26 DIAGNOSIS — Z041 Encounter for examination and observation following transport accident: Secondary | ICD-10-CM | POA: Diagnosis not present

## 2017-08-26 LAB — KLEIHAUER-BETKE STAIN
Fetal Cells %: 0.1 %
Quantitation Fetal Hemoglobin: 0.0005 mL

## 2017-08-26 NOTE — Progress Notes (Signed)
Discharge instructions given. Pt verbalized understanding and had no questions. Instructed to go to scheduled appointments. Discharged home with SO.

## 2017-08-26 NOTE — Discharge Summary (Signed)
  See FPN 
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
## 2017-08-27 ENCOUNTER — Ambulatory Visit: Payer: Medicaid Other

## 2017-08-28 ENCOUNTER — Ambulatory Visit (INDEPENDENT_AMBULATORY_CARE_PROVIDER_SITE_OTHER): Payer: Medicaid Other

## 2017-08-28 DIAGNOSIS — Z3A20 20 weeks gestation of pregnancy: Secondary | ICD-10-CM

## 2017-08-28 DIAGNOSIS — O09212 Supervision of pregnancy with history of pre-term labor, second trimester: Secondary | ICD-10-CM | POA: Diagnosis not present

## 2017-08-28 MED ORDER — HYDROXYPROGESTERONE CAPROATE 250 MG/ML IM OIL
250.0000 mg | TOPICAL_OIL | Freq: Once | INTRAMUSCULAR | Status: AC
  Administered 2017-08-28: 250 mg via INTRAMUSCULAR

## 2017-08-28 NOTE — Progress Notes (Signed)
Pt here for 17P (hydroxyprogesterone) which was given IM right glut.  NDC# [id number]

## 2017-09-03 ENCOUNTER — Ambulatory Visit (INDEPENDENT_AMBULATORY_CARE_PROVIDER_SITE_OTHER): Payer: Medicaid Other

## 2017-09-03 DIAGNOSIS — Z3A21 21 weeks gestation of pregnancy: Secondary | ICD-10-CM

## 2017-09-03 DIAGNOSIS — O09212 Supervision of pregnancy with history of pre-term labor, second trimester: Secondary | ICD-10-CM

## 2017-09-03 MED ORDER — HYDROXYPROGESTERONE CAPROATE 250 MG/ML IM OIL
250.0000 mg | TOPICAL_OIL | Freq: Once | INTRAMUSCULAR | Status: AC
  Administered 2017-09-03: 250 mg via INTRAMUSCULAR

## 2017-09-10 ENCOUNTER — Observation Stay
Admission: EM | Admit: 2017-09-10 | Discharge: 2017-09-10 | Disposition: A | Discharge status: Home / Self Care | Payer: Medicaid Other | Attending: Obstetrics and Gynecology | Admitting: Obstetrics and Gynecology

## 2017-09-10 ENCOUNTER — Encounter: Payer: Self-pay | Admitting: *Deleted

## 2017-09-10 ENCOUNTER — Ambulatory Visit: Payer: Medicaid Other

## 2017-09-10 DIAGNOSIS — O09212 Supervision of pregnancy with history of pre-term labor, second trimester: Secondary | ICD-10-CM

## 2017-09-10 DIAGNOSIS — F32A Depression, unspecified: Secondary | ICD-10-CM

## 2017-09-10 DIAGNOSIS — Z98891 History of uterine scar from previous surgery: Secondary | ICD-10-CM

## 2017-09-10 DIAGNOSIS — R109 Unspecified abdominal pain: Secondary | ICD-10-CM | POA: Diagnosis not present

## 2017-09-10 DIAGNOSIS — Z8759 Personal history of other complications of pregnancy, childbirth and the puerperium: Secondary | ICD-10-CM

## 2017-09-10 DIAGNOSIS — O09891 Supervision of other high risk pregnancies, first trimester: Secondary | ICD-10-CM

## 2017-09-10 DIAGNOSIS — Z3A22 22 weeks gestation of pregnancy: Secondary | ICD-10-CM | POA: Insufficient documentation

## 2017-09-10 DIAGNOSIS — O99331 Smoking (tobacco) complicating pregnancy, first trimester: Secondary | ICD-10-CM

## 2017-09-10 DIAGNOSIS — F419 Anxiety disorder, unspecified: Secondary | ICD-10-CM

## 2017-09-10 DIAGNOSIS — F329 Major depressive disorder, single episode, unspecified: Secondary | ICD-10-CM

## 2017-09-10 DIAGNOSIS — Z8751 Personal history of pre-term labor: Secondary | ICD-10-CM

## 2017-09-10 DIAGNOSIS — O0990 Supervision of high risk pregnancy, unspecified, unspecified trimester: Secondary | ICD-10-CM

## 2017-09-10 DIAGNOSIS — O26892 Other specified pregnancy related conditions, second trimester: Principal | ICD-10-CM | POA: Insufficient documentation

## 2017-09-10 LAB — URINALYSIS, COMPLETE (UACMP) WITH MICROSCOPIC
BACTERIA UA: NONE SEEN
Bilirubin Urine: NEGATIVE
Glucose, UA: NEGATIVE mg/dL
HGB URINE DIPSTICK: NEGATIVE
KETONES UR: NEGATIVE mg/dL
Leukocytes, UA: NEGATIVE
NITRITE: NEGATIVE
PROTEIN: NEGATIVE mg/dL
Specific Gravity, Urine: 1.014 (ref 1.005–1.030)
pH: 7 (ref 5.0–8.0)

## 2017-09-10 MED ORDER — ACETAMINOPHEN 325 MG PO TABS: 650.0000 mg | ORAL_TABLET | ORAL | Status: DC | PRN

## 2017-09-10 MED ORDER — CALCIUM CARBONATE ANTACID 500 MG PO CHEW
1.0000 | CHEWABLE_TABLET | Freq: Once | ORAL | Status: AC
  Administered 2017-09-10: 200 mg via ORAL
  Filled 2017-09-10: qty 1

## 2017-09-10 MED ORDER — RANITIDINE HCL 150 MG PO TABS: 150.0000 mg | ORAL_TABLET | Freq: Two times a day (BID) | ORAL | 1 refills | Status: DC

## 2017-09-10 NOTE — Progress Notes (Signed)
Pt given discharge instructions. Pt verbalized understanding and had no questions. Ambulated home with family.

## 2017-09-10 NOTE — Final Progress Note (Signed)
Physician Final Progress Note  Patient ID: Maria Richmond MRN: [id number] DOB/AGE: 28-Jun-1995 22 y.o.  Admit date: 09/10/2017 Admitting provider: Vena Austria, MD Discharge date: 09/10/2017   Admission Diagnoses: GERD  Discharge Diagnoses:  Active Problems:   Labor and delivery indication for care or intervention  22 y.o. [id number] at [redacted]w[redacted]d presenting with abdominal pain after eating pulled pork and abdominal pressure.  Treated with tums with symptoms resolution.  No contractions on tocometer.  UA negative. Rx for Zantac  Consults: None  Significant Findings/ Diagnostic Studies:  Results for orders placed or performed during the hospital encounter of 09/10/17 (from the past 24 hour(s))  Urinalysis, Complete w Microscopic     Status: Abnormal   Collection Time: 09/10/17  8:42 PM  Result Value Ref Range   Color, Urine YELLOW (A) YELLOW   APPearance CLEAR (A) CLEAR   Specific Gravity, Urine 1.014 1.005 - 1.030   pH 7.0 5.0 - 8.0   Glucose, UA NEGATIVE NEGATIVE mg/dL   Hgb urine dipstick NEGATIVE NEGATIVE   Bilirubin Urine NEGATIVE NEGATIVE   Ketones, ur NEGATIVE NEGATIVE mg/dL   Protein, ur NEGATIVE NEGATIVE mg/dL   Nitrite NEGATIVE NEGATIVE   Leukocytes, UA NEGATIVE NEGATIVE   RBC / HPF 0-5 0 - 5 RBC/hpf   WBC, UA 0-5 0 - 5 WBC/hpf   Bacteria, UA NONE SEEN NONE SEEN   Squamous Epithelial / LPF 0-5 0 - 5   Mucus PRESENT      Procedures: none  Discharge Condition: good  Disposition: Discharge disposition: 01-Home or Self Care       Diet: Regular diet  Discharge Activity: Activity as tolerated  Discharge Instructions    Discharge activity:  No Restrictions   Complete by:  As directed    Discharge diet:  No restrictions   Complete by:  As directed    No sexual activity restrictions   Complete by:  As directed    Notify physician for a general feeling that "something is not right"   Complete by:  As directed    Notify physician for increase or change  in vaginal discharge   Complete by:  As directed    Notify physician for intestinal cramps, with or without diarrhea, sometimes described as "gas pain"   Complete by:  As directed    Notify physician for leaking of fluid   Complete by:  As directed    Notify physician for low, dull backache, unrelieved by heat or Tylenol   Complete by:  As directed    Notify physician for menstrual like cramps   Complete by:  As directed    Notify physician for pelvic pressure   Complete by:  As directed    Notify physician for uterine contractions.  These may be painless and feel like the uterus is tightening or the baby is  "balling up"   Complete by:  As directed    Notify physician for vaginal bleeding   Complete by:  As directed    PRETERM LABOR:  Includes any of the follwing symptoms that occur between 20 - [redacted] weeks gestation.  If these symptoms are not stopped, preterm labor can result in preterm delivery, placing your baby at risk   Complete by:  As directed      Allergies as of 09/10/2017   No Known Allergies     Medication List    TAKE these medications   prenatal multivitamin Tabs tablet Take 1 tablet by mouth daily at 12 noon.  ranitidine 150 MG tablet Commonly known as:  ZANTAC Take 1 tablet (150 mg total) by mouth 2 (two) times daily.        Total time spent taking care of this patient: 30 minutes  Signed: Vena Austria 09/10/2017, 9:31 PM

## 2017-09-10 NOTE — Discharge Summary (Signed)
See final progress
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
## 2017-09-17 ENCOUNTER — Ambulatory Visit (INDEPENDENT_AMBULATORY_CARE_PROVIDER_SITE_OTHER): Payer: Medicaid Other

## 2017-09-17 ENCOUNTER — Encounter: Payer: Self-pay | Admitting: Advanced Practice Midwife

## 2017-09-17 ENCOUNTER — Ambulatory Visit (INDEPENDENT_AMBULATORY_CARE_PROVIDER_SITE_OTHER): Payer: Medicaid Other | Admitting: Advanced Practice Midwife

## 2017-09-17 VITALS — BP 108/66 | Wt 170.0 lb

## 2017-09-17 DIAGNOSIS — Z130 Encounter for screening for diseases of the blood and blood-forming organs and certain disorders involving the immune mechanism: Secondary | ICD-10-CM

## 2017-09-17 DIAGNOSIS — O0990 Supervision of high risk pregnancy, unspecified, unspecified trimester: Secondary | ICD-10-CM

## 2017-09-17 DIAGNOSIS — Z3A23 23 weeks gestation of pregnancy: Secondary | ICD-10-CM | POA: Diagnosis not present

## 2017-09-17 DIAGNOSIS — Z113 Encounter for screening for infections with a predominantly sexual mode of transmission: Secondary | ICD-10-CM

## 2017-09-17 DIAGNOSIS — Z3689 Encounter for other specified antenatal screening: Secondary | ICD-10-CM

## 2017-09-17 DIAGNOSIS — O09212 Supervision of pregnancy with history of pre-term labor, second trimester: Secondary | ICD-10-CM

## 2017-09-17 DIAGNOSIS — Z131 Encounter for screening for diabetes mellitus: Secondary | ICD-10-CM

## 2017-09-17 DIAGNOSIS — O09892 Supervision of other high risk pregnancies, second trimester: Secondary | ICD-10-CM

## 2017-09-17 LAB — POCT URINALYSIS DIPSTICK OB
Glucose, UA: NEGATIVE
PROTEIN: NEGATIVE

## 2017-09-17 MED ORDER — HYDROXYPROGESTERONE CAPROATE 250 MG/ML IM OIL
250.0000 mg | TOPICAL_OIL | Freq: Once | INTRAMUSCULAR | Status: AC
  Administered 2017-09-17: 250 mg via INTRAMUSCULAR

## 2017-09-17 NOTE — Progress Notes (Signed)
F/U anatomy scan, 17p today. No complaints.
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
## 2017-09-21 ENCOUNTER — Emergency Department
Admission: EM | Admit: 2017-09-21 | Discharge: 2017-09-21 | Discharge status: Against Medical Advice | Payer: No Typology Code available for payment source

## 2017-09-21 NOTE — ED Notes (Signed)
Patient called for triage x 3, not in waiting room.

## 2017-09-24 ENCOUNTER — Ambulatory Visit (INDEPENDENT_AMBULATORY_CARE_PROVIDER_SITE_OTHER): Payer: Medicaid Other

## 2017-09-24 ENCOUNTER — Ambulatory Visit (INDEPENDENT_AMBULATORY_CARE_PROVIDER_SITE_OTHER): Payer: Medicaid Other | Admitting: Obstetrics & Gynecology

## 2017-09-24 VITALS — BP 118/72 | Wt 171.0 lb

## 2017-09-24 DIAGNOSIS — Z3A24 24 weeks gestation of pregnancy: Secondary | ICD-10-CM

## 2017-09-24 DIAGNOSIS — Z98891 History of uterine scar from previous surgery: Secondary | ICD-10-CM

## 2017-09-24 DIAGNOSIS — O09212 Supervision of pregnancy with history of pre-term labor, second trimester: Secondary | ICD-10-CM

## 2017-09-24 DIAGNOSIS — Z8751 Personal history of pre-term labor: Secondary | ICD-10-CM

## 2017-09-24 DIAGNOSIS — O34219 Maternal care for unspecified type scar from previous cesarean delivery: Secondary | ICD-10-CM

## 2017-09-24 DIAGNOSIS — O0990 Supervision of high risk pregnancy, unspecified, unspecified trimester: Secondary | ICD-10-CM

## 2017-09-24 LAB — POCT URINALYSIS DIPSTICK OB
GLUCOSE, UA: NEGATIVE
POC,PROTEIN,UA: NEGATIVE

## 2017-09-24 MED ORDER — HYDROXYPROGESTERONE CAPROATE 250 MG/ML IM OIL
250.0000 mg | TOPICAL_OIL | Freq: Once | INTRAMUSCULAR | Status: AC
  Administered 2017-09-24: 250 mg via INTRAMUSCULAR

## 2017-09-24 NOTE — Progress Notes (Signed)
Pt here for hydroxyprogesterone which was given IM left glut.  NDC# [phone]

## 2017-09-24 NOTE — Progress Notes (Signed)
  Subjective  Fetal Movement? yes Contractions? no Leaking Fluid? no Vaginal Bleeding? no Syncope last week, seen on L&D Objective  BP 118/72   Wt 171 lb (77.6 kg)   LMP 01/07/2017   BMI 31.28 kg/m  General: NAD Pumonary: no increased work of breathing Abdomen: gravid, non-tender Extremities: no edema Psychiatric: mood appropriate, affect full  Assessment  22 y.o. [id number] at 7672w6d by  01/08/2018, by Ultrasound presenting for routine prenatal visit  Plan   Problem List Items Addressed This Visit      Other   History of classical cesarean section   History of preterm delivery   Supervision of high risk pregnancy, antepartum    Other Visit Diagnoses    [redacted] weeks gestation of pregnancy    -  Primary   Relevant Orders   POC Urinalysis Dipstick OB (Completed)    Labs nv Makena weekly RTW Saturday note given  Annamarie MajorPaul Kimberlea Schlag, MD, Merlinda FrederickFACOG [facility], [facility] 09/24/2017  2:43 PM

## 2017-09-24 NOTE — Patient Instructions (Signed)

## 2017-10-01 ENCOUNTER — Ambulatory Visit: Payer: Medicaid Other

## 2017-10-08 ENCOUNTER — Ambulatory Visit (INDEPENDENT_AMBULATORY_CARE_PROVIDER_SITE_OTHER): Payer: Medicaid Other

## 2017-10-08 DIAGNOSIS — Z3A21 21 weeks gestation of pregnancy: Secondary | ICD-10-CM | POA: Diagnosis not present

## 2017-10-08 DIAGNOSIS — O09892 Supervision of other high risk pregnancies, second trimester: Secondary | ICD-10-CM

## 2017-10-08 DIAGNOSIS — O09212 Supervision of pregnancy with history of pre-term labor, second trimester: Secondary | ICD-10-CM

## 2017-10-08 MED ORDER — HYDROXYPROGESTERONE CAPROATE 250 MG/ML IM OIL
250.0000 mg | TOPICAL_OIL | Freq: Once | INTRAMUSCULAR | Status: AC
  Administered 2017-10-08: 250 mg via INTRAMUSCULAR

## 2017-10-15 ENCOUNTER — Other Ambulatory Visit: Payer: Medicaid Other

## 2017-10-15 ENCOUNTER — Ambulatory Visit (INDEPENDENT_AMBULATORY_CARE_PROVIDER_SITE_OTHER): Payer: Medicaid Other | Admitting: Maternal Newborn

## 2017-10-15 ENCOUNTER — Encounter: Payer: Self-pay | Admitting: Maternal Newborn

## 2017-10-15 VITALS — BP 118/70 | Wt 174.0 lb

## 2017-10-15 DIAGNOSIS — Z130 Encounter for screening for diseases of the blood and blood-forming organs and certain disorders involving the immune mechanism: Secondary | ICD-10-CM

## 2017-10-15 DIAGNOSIS — Z113 Encounter for screening for infections with a predominantly sexual mode of transmission: Secondary | ICD-10-CM

## 2017-10-15 DIAGNOSIS — Z131 Encounter for screening for diabetes mellitus: Secondary | ICD-10-CM

## 2017-10-15 DIAGNOSIS — O0990 Supervision of high risk pregnancy, unspecified, unspecified trimester: Secondary | ICD-10-CM

## 2017-10-15 DIAGNOSIS — Z3A27 27 weeks gestation of pregnancy: Secondary | ICD-10-CM | POA: Diagnosis not present

## 2017-10-15 DIAGNOSIS — O09212 Supervision of pregnancy with history of pre-term labor, second trimester: Secondary | ICD-10-CM | POA: Diagnosis not present

## 2017-10-15 DIAGNOSIS — O34219 Maternal care for unspecified type scar from previous cesarean delivery: Secondary | ICD-10-CM

## 2017-10-15 LAB — POCT URINALYSIS DIPSTICK OB
GLUCOSE, UA: NEGATIVE
PROTEIN: NEGATIVE

## 2017-10-15 MED ORDER — HYDROXYPROGESTERONE CAPROATE 250 MG/ML IM OIL
250.0000 mg | TOPICAL_OIL | Freq: Once | INTRAMUSCULAR | Status: AC
  Administered 2017-10-15: 250 mg via INTRAMUSCULAR

## 2017-10-15 NOTE — Patient Instructions (Signed)
Third Trimester of Pregnancy The third trimester is from week 28 through week 40 (months 7 through 9). The third trimester is a time when the unborn baby (fetus) is growing rapidly. At the end of the ninth month, the fetus is about 20 inches in length and weighs 6-10 pounds. Body changes during your third trimester Your body will continue to go through many changes during pregnancy. The changes vary from woman to woman. During the third trimester:  Your weight will continue to increase. You can expect to gain 25-35 pounds (11-16 kg) by the end of the pregnancy.  You may begin to get stretch marks on your hips, abdomen, and breasts.  You may urinate more often because the fetus is moving lower into your pelvis and pressing on your bladder.  You may develop or continue to have heartburn. This is caused by increased hormones that slow down muscles in the digestive tract.  You may develop or continue to have constipation because increased hormones slow digestion and cause the muscles that push waste through your intestines to relax.  You may develop hemorrhoids. These are swollen veins (varicose veins) in the rectum that can itch or be painful.  You may develop swollen, bulging veins (varicose veins) in your legs.  You may have increased body aches in the pelvis, back, or thighs. This is due to weight gain and increased hormones that are relaxing your joints.  You may have changes in your hair. These can include thickening of your hair, rapid growth, and changes in texture. Some women also have hair loss during or after pregnancy, or hair that feels dry or thin. Your hair will most likely return to normal after your baby is born.  Your breasts will continue to grow and they will continue to become tender. A yellow fluid (colostrum) may leak from your breasts. This is the first milk you are producing for your baby.  Your belly button may stick out.  You may notice more swelling in your hands,  face, or ankles.  You may have increased tingling or numbness in your hands, arms, and legs. The skin on your belly may also feel numb.  You may feel short of breath because of your expanding uterus.  You may have more problems sleeping. This can be caused by the size of your belly, increased need to urinate, and an increase in your body's metabolism.  You may notice the fetus "dropping," or moving lower in your abdomen (lightening).  You may have increased vaginal discharge.  You may notice your joints feel loose and you may have pain around your pelvic bone.  What to expect at prenatal visits You will have prenatal exams every 2 weeks until week 36. Then you will have weekly prenatal exams. During a routine prenatal visit:  You will be weighed to make sure you and the baby are growing normally.  Your blood pressure will be taken.  Your abdomen will be measured to track your baby's growth.  The fetal heartbeat will be listened to.  Any test results from the previous visit will be discussed.  You may have a cervical check near your due date to see if your cervix has softened or thinned (effaced).  You will be tested for Group B streptococcus. This happens between 35 and 37 weeks.  Your health care provider may ask you:  What your birth plan is.  How you are feeling.  If you are feeling the baby move.  If you have had   any abnormal symptoms, such as leaking fluid, bleeding, severe headaches, or abdominal cramping.  If you are using any tobacco products, including cigarettes, chewing tobacco, and electronic cigarettes.  If you have any questions.  Other tests or screenings that may be performed during your third trimester include:  Blood tests that check for low iron levels (anemia).  Fetal testing to check the health, activity level, and growth of the fetus. Testing is done if you have certain medical conditions or if there are problems during the  pregnancy.  Nonstress test (NST). This test checks the health of your baby to make sure there are no signs of problems, such as the baby not getting enough oxygen. During this test, a belt is placed around your belly. The baby is made to move, and its heart rate is monitored during movement.  What is false labor? False labor is a condition in which you feel small, irregular tightenings of the muscles in the womb (contractions) that usually go away with rest, changing position, or drinking water. These are called Braxton Hicks contractions. Contractions may last for hours, days, or even weeks before true labor sets in. If contractions come at regular intervals, become more frequent, increase in intensity, or become painful, you should see your health care provider. What are the signs of labor?  Abdominal cramps.  Regular contractions that start at 10 minutes apart and become stronger and more frequent with time.  Contractions that start on the top of the uterus and spread down to the lower abdomen and back.  Increased pelvic pressure and dull back pain.  A watery or bloody mucus discharge that comes from the vagina.  Leaking of amniotic fluid. This is also known as your "water breaking." It could be a slow trickle or a gush. Let your health care provider know if it has a color or strange odor. If you have any of these signs, call your health care provider right away, even if it is before your due date. Follow these instructions at home: Medicines  Follow your health care provider's instructions regarding medicine use. Specific medicines may be either safe or unsafe to take during pregnancy.  Take a prenatal vitamin that contains at least 600 micrograms (mcg) of folic acid.  If you develop constipation, try taking a stool softener if your health care provider approves. Eating and drinking  Eat a balanced diet that includes fresh fruits and vegetables, whole grains, good sources of protein  such as meat, eggs, or tofu, and low-fat dairy. Your health care provider will help you determine the amount of weight gain that is right for you.  Avoid raw meat and uncooked cheese. These carry germs that can cause birth defects in the baby.  If you have low calcium intake from food, talk to your health care provider about whether you should take a daily calcium supplement.  Eat four or five small meals rather than three large meals a day.  Limit foods that are high in fat and processed sugars, such as fried and sweet foods.  To prevent constipation: ? Drink enough fluid to keep your urine clear or pale yellow. ? Eat foods that are high in fiber, such as fresh fruits and vegetables, whole grains, and beans. Activity  Exercise only as directed by your health care provider. Most women can continue their usual exercise routine during pregnancy. Try to exercise for 30 minutes at least 5 days a week. Stop exercising if you experience uterine contractions.  Avoid heavy   lifting.  Do not exercise in extreme heat or humidity, or at high altitudes.  Wear low-heel, comfortable shoes.  Practice good posture.  You may continue to have sex unless your health care provider tells you otherwise. Relieving pain and discomfort  Take frequent breaks and rest with your legs elevated if you have leg cramps or low back pain.  Take warm sitz baths to soothe any pain or discomfort caused by hemorrhoids. Use hemorrhoid cream if your health care provider approves.  Wear a good support bra to prevent discomfort from breast tenderness.  If you develop varicose veins: ? Wear support pantyhose or compression stockings as told by your healthcare provider. ? Elevate your feet for 15 minutes, 3-4 times a day. Prenatal care  Write down your questions. Take them to your prenatal visits.  Keep all your prenatal visits as told by your health care provider. This is important. Safety  Wear your seat belt at  all times when driving.  Make a list of emergency phone numbers, including numbers for family, friends, the hospital, and police and fire departments. General instructions  Avoid cat litter boxes and soil used by cats. These carry germs that can cause birth defects in the baby. If you have a cat, ask someone to clean the litter box for you.  Do not travel far distances unless it is absolutely necessary and only with the approval of your health care provider.  Do not use hot tubs, steam rooms, or saunas.  Do not drink alcohol.  Do not use any products that contain nicotine or tobacco, such as cigarettes and e-cigarettes. If you need help quitting, ask your health care provider.  Do not use any medicinal herbs or unprescribed drugs. These chemicals affect the formation and growth of the baby.  Do not douche or use tampons or scented sanitary pads.  Do not cross your legs for long periods of time.  To prepare for the arrival of your baby: ? Take prenatal classes to understand, practice, and ask questions about labor and delivery. ? Make a trial run to the hospital. ? Visit the hospital and tour the maternity area. ? Arrange for maternity or paternity leave through employers. ? Arrange for family and friends to take care of pets while you are in the hospital. ? Purchase a rear-facing car seat and make sure you know how to install it in your car. ? Pack your hospital bag. ? Prepare the baby's nursery. Make sure to remove all pillows and stuffed animals from the baby's crib to prevent suffocation.  Visit your dentist if you have not gone during your pregnancy. Use a soft toothbrush to brush your teeth and be gentle when you floss. Contact a health care provider if:  You are unsure if you are in labor or if your water has broken.  You become dizzy.  You have mild pelvic cramps, pelvic pressure, or nagging pain in your abdominal area.  You have lower back pain.  You have persistent  nausea, vomiting, or diarrhea.  You have an unusual or bad smelling vaginal discharge.  You have pain when you urinate. Get help right away if:  Your water breaks before 37 weeks.  You have regular contractions less than 5 minutes apart before 37 weeks.  You have a fever.  You are leaking fluid from your vagina.  You have spotting or bleeding from your vagina.  You have severe abdominal pain or cramping.  You have rapid weight loss or weight gain.    You have shortness of breath with chest pain.  You notice sudden or extreme swelling of your face, hands, ankles, feet, or legs.  Your baby makes fewer than 10 movements in 2 hours.  You have severe headaches that do not go away when you take medicine.  You have vision changes. Summary  The third trimester is from week 28 through week 40, months 7 through 9. The third trimester is a time when the unborn baby (fetus) is growing rapidly.  During the third trimester, your discomfort may increase as you and your baby continue to gain weight. You may have abdominal, leg, and back pain, sleeping problems, and an increased need to urinate.  During the third trimester your breasts will keep growing and they will continue to become tender. A yellow fluid (colostrum) may leak from your breasts. This is the first milk you are producing for your baby.  False labor is a condition in which you feel small, irregular tightenings of the muscles in the womb (contractions) that eventually go away. These are called Braxton Hicks contractions. Contractions may last for hours, days, or even weeks before true labor sets in.  Signs of labor can include: abdominal cramps; regular contractions that start at 10 minutes apart and become stronger and more frequent with time; watery or bloody mucus discharge that comes from the vagina; increased pelvic pressure and dull back pain; and leaking of amniotic fluid. This information is not intended to replace advice  given to you by your health care provider. Make sure you discuss any questions you have with your health care provider. Document Released: 12/17/2000 Document Revised: 05/31/2015 Document Reviewed: 02/24/2012 Elsevier Interactive Patient Education  2017 [employer]  

## 2017-10-15 NOTE — Progress Notes (Signed)
No concerns.rj 
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
## 2017-10-16 LAB — 28 WEEK RH+PANEL
BASOS: 1 %
Basophils Absolute: 0 10*3/uL (ref 0.0–0.2)
EOS (ABSOLUTE): 0.3 10*3/uL (ref 0.0–0.4)
EOS: 5 %
Gestational Diabetes Screen: 111 mg/dL (ref 65–139)
HEMATOCRIT: 31.3 % — AB (ref 34.0–46.6)
HIV SCREEN 4TH GENERATION: NONREACTIVE
Hemoglobin: 10.6 g/dL — ABNORMAL LOW (ref 11.1–15.9)
IMMATURE GRANS (ABS): 0 10*3/uL (ref 0.0–0.1)
Immature Granulocytes: 0 %
LYMPHS: 26 %
Lymphocytes Absolute: 1.9 10*3/uL (ref 0.7–3.1)
MCH: 29.9 pg (ref 26.6–33.0)
MCHC: 33.9 g/dL (ref 31.5–35.7)
MCV: 88 fL (ref 79–97)
MONOCYTES: 7 %
Monocytes Absolute: 0.5 10*3/uL (ref 0.1–0.9)
NEUTROS ABS: 4.5 10*3/uL (ref 1.4–7.0)
Neutrophils: 61 %
Platelets: 165 10*3/uL (ref 150–450)
RBC: 3.55 x10E6/uL — ABNORMAL LOW (ref 3.77–5.28)
RDW: 12.8 % (ref 12.3–15.4)
RPR Ser Ql: NONREACTIVE
WBC: 7.2 10*3/uL (ref 3.4–10.8)

## 2017-10-20 ENCOUNTER — Observation Stay
Admission: EM | Admit: 2017-10-20 | Discharge: 2017-10-21 | Disposition: A | Discharge status: Home / Self Care | Payer: Medicaid Other | Attending: Obstetrics and Gynecology | Admitting: Obstetrics and Gynecology

## 2017-10-20 ENCOUNTER — Other Ambulatory Visit: Payer: Self-pay

## 2017-10-20 DIAGNOSIS — Z79899 Other long term (current) drug therapy: Secondary | ICD-10-CM | POA: Diagnosis not present

## 2017-10-20 DIAGNOSIS — Z87891 Personal history of nicotine dependence: Secondary | ICD-10-CM | POA: Insufficient documentation

## 2017-10-20 DIAGNOSIS — O0990 Supervision of high risk pregnancy, unspecified, unspecified trimester: Secondary | ICD-10-CM

## 2017-10-20 DIAGNOSIS — F419 Anxiety disorder, unspecified: Secondary | ICD-10-CM

## 2017-10-20 DIAGNOSIS — O34212 Maternal care for vertical scar from previous cesarean delivery: Secondary | ICD-10-CM | POA: Insufficient documentation

## 2017-10-20 DIAGNOSIS — Z8759 Personal history of other complications of pregnancy, childbirth and the puerperium: Secondary | ICD-10-CM

## 2017-10-20 DIAGNOSIS — F32A Depression, unspecified: Secondary | ICD-10-CM

## 2017-10-20 DIAGNOSIS — O99331 Smoking (tobacco) complicating pregnancy, first trimester: Secondary | ICD-10-CM

## 2017-10-20 DIAGNOSIS — O9989 Other specified diseases and conditions complicating pregnancy, childbirth and the puerperium: Secondary | ICD-10-CM | POA: Diagnosis present

## 2017-10-20 DIAGNOSIS — O09891 Supervision of other high risk pregnancies, first trimester: Secondary | ICD-10-CM

## 2017-10-20 DIAGNOSIS — Z3A28 28 weeks gestation of pregnancy: Secondary | ICD-10-CM | POA: Diagnosis not present

## 2017-10-20 DIAGNOSIS — Z8751 Personal history of pre-term labor: Secondary | ICD-10-CM | POA: Diagnosis not present

## 2017-10-20 DIAGNOSIS — F329 Major depressive disorder, single episode, unspecified: Secondary | ICD-10-CM

## 2017-10-20 DIAGNOSIS — N898 Other specified noninflammatory disorders of vagina: Secondary | ICD-10-CM | POA: Diagnosis not present

## 2017-10-20 DIAGNOSIS — Z98891 History of uterine scar from previous surgery: Secondary | ICD-10-CM

## 2017-10-20 NOTE — OB Triage Note (Signed)
Pt arrived by EMS c/o of contractions and possible ROM. Pt states that clear, moderate amounts of fluid started flowing around 2300 tonight. Pt also states she is contracting every 1-2 min, rating 7/10. Nitrazine negative. Monitors applied and assessing. Vital signs WNL.

## 2017-10-21 DIAGNOSIS — O09213 Supervision of pregnancy with history of pre-term labor, third trimester: Secondary | ICD-10-CM | POA: Diagnosis not present

## 2017-10-21 DIAGNOSIS — Z3A28 28 weeks gestation of pregnancy: Secondary | ICD-10-CM | POA: Diagnosis not present

## 2017-10-21 DIAGNOSIS — O9989 Other specified diseases and conditions complicating pregnancy, childbirth and the puerperium: Secondary | ICD-10-CM | POA: Diagnosis not present

## 2017-10-21 LAB — URINE DRUG SCREEN, QUALITATIVE (ARMC ONLY)
Amphetamines, Ur Screen: NOT DETECTED
BARBITURATES, UR SCREEN: NOT DETECTED
BENZODIAZEPINE, UR SCRN: NOT DETECTED
CANNABINOID 50 NG, UR ~~LOC~~: NOT DETECTED
Cocaine Metabolite,Ur ~~LOC~~: NOT DETECTED
MDMA (Ecstasy)Ur Screen: NOT DETECTED
METHADONE SCREEN, URINE: NOT DETECTED
Opiate, Ur Screen: NOT DETECTED
Phencyclidine (PCP) Ur S: NOT DETECTED
TRICYCLIC, UR SCREEN: NOT DETECTED

## 2017-10-21 LAB — URINALYSIS, COMPLETE (UACMP) WITH MICROSCOPIC
Bacteria, UA: NONE SEEN
Bilirubin Urine: NEGATIVE
GLUCOSE, UA: NEGATIVE mg/dL
Hgb urine dipstick: NEGATIVE
Ketones, ur: NEGATIVE mg/dL
Leukocytes, UA: NEGATIVE
NITRITE: NEGATIVE
PH: 6 (ref 5.0–8.0)
Protein, ur: NEGATIVE mg/dL
SPECIFIC GRAVITY, URINE: 1.021 (ref 1.005–1.030)

## 2017-10-21 NOTE — Discharge Summary (Signed)
See final progress note. 

## 2017-10-21 NOTE — Final Progress Note (Signed)
Physician Final Progress Note  Patient ID: Maria Richmond MRN: [id number] DOB/AGE: 02/07/1995 22 y.o.  Admit date: 10/20/2017 Admitting provider: Conard Novak, MD Discharge date: 10/21/2017   Admission Diagnoses:  1) intrauterine pregnancy at [redacted]w[redacted]d  2) history of classical cesarean section 3) history of preterm delivery 4) concern for rupture of membranes  Discharge Diagnoses:  1) intrauterine pregnancy at [redacted]w[redacted]d  2) history of classical cesarean section 3) history of preterm delivery 4) concern for rupture of membranes - no evidence of rupture of membranes  History of Present Illness: The patient is a 22 y.o. female [phone] at [redacted]w[redacted]d who presents for a gush of fluid at 1130PM the night prior to presentation.  She was reaching over to get some salsa while having dinner. She had a gush of fluid that was clear. She did not continue to leak fluid. She noted some contractions after this episode.  She denies vaginal bleeding. She notes +FM.  Hospital Course: She was admitted to labor and delivery for observation.  She had normal vital signs. She had a reassuring fetal tracing.  She had a pelvic exam that showed no pooling, negative nitrazine, and negative ferning. She had a GC/Chlamydia test and wet prep performed, but these labs were not able to be resulted due to a labeling error, per the lab.  Her UA was unremarkable.  She had no vaginal symptoms otherwise.  Her cervix was closed.  She was monitored for several hours and as she was not laboring, her membranes were intact, and she had no other acute issues, she was discharged home.    Past Medical History:  Diagnosis Date  . Depression   . Medical history non-contributory   . Preterm labor     Past Surgical History:  Procedure Laterality Date  . CESAREAN SECTION    . CESAREAN SECTION N/A 07/17/2016   Procedure: CESAREAN SECTION, Female, 6lb, 6oz;  Surgeon: Conard Novak, MD;  Location: [facility] ORS;  Service: Obstetrics;   Laterality: N/A;    No current facility-administered medications on file prior to encounter.    Current Outpatient Medications on File Prior to Encounter  Medication Sig Dispense Refill  . Prenatal Vit-Fe Fumarate-FA (PRENATAL MULTIVITAMIN) TABS tablet Take 1 tablet by mouth daily at 12 noon.    . ranitidine (ZANTAC) 150 MG tablet Take 1 tablet (150 mg total) by mouth 2 (two) times daily. 60 tablet 1    No Known Allergies  Social History   Socioeconomic History  . Marital status: Single    Spouse name: Not on file  . Number of children: 1  . Years of education: 73  . Highest education level: Not on file  Occupational History  . Occupation: UNEMPLOYED  Social Needs  . Financial resource strain: Not on file  . Food insecurity:    Worry: Not on file    Inability: Not on file  . Transportation needs:    Medical: Not on file    Non-medical: Not on file  Tobacco Use  . Smoking status: Former Smoker    Packs/day: 1.00    Types: Cigarettes  . Smokeless tobacco: Never Used  Substance and Sexual Activity  . Alcohol use: No  . Drug use: No    Comment: positive uds for MJ, states no use recently  . Sexual activity: Yes    Birth control/protection: None  Lifestyle  . Physical activity:    Days per week: Not on file    Minutes per session: Not on  file  . Stress: Not on file  Relationships  . Social connections:    Talks on phone: Not on file    Gets together: Not on file    Attends religious service: Not on file    Active member of club or organization: Not on file    Attends meetings of clubs or organizations: Not on file    Relationship status: Not on file  . Intimate partner violence:    Fear of current or ex partner: Not on file    Emotionally abused: Not on file    Physically abused: Not on file    Forced sexual activity: Not on file  Other Topics Concern  . Not on file  Social History Narrative   ** Merged History Encounter **       Review of Systems:  negative unless noted in HPI  Physical Exam: BP 139/84 (BP Location: Right Arm)   Pulse 100   Temp 98.5 F (36.9 C) (Oral)   Resp 20   Ht 5\' 2"  (1.575 m)   Wt 79.4 kg   LMP 01/07/2017   BMI 32.01 kg/m   Gen: NAD CV: RRR Pulm: CTAB Pelvic: NEFG, vagina is pink and normally rugated.  Cervix appears normal. No pooling.  Cvx: closed/thick/high Ext: no e/c/t  Consults: None  Significant Findings/ Diagnostic Studies:  Lab Results  Component Value Date   APPEARANCEUR CLEAR (A) 10/21/2017   GLUCOSEU NEGATIVE 10/21/2017   BILIRUBINUR NEGATIVE 10/21/2017   KETONESUR NEGATIVE 10/21/2017   LABSPEC 1.021 10/21/2017   HGBUR NEGATIVE 10/21/2017   PHURINE 6.0 10/21/2017   NITRITE NEGATIVE 10/21/2017   LEUKOCYTESUR NEGATIVE 10/21/2017   RBCU 0-5 10/21/2017   WBCU 0-5 10/21/2017   BACTERIA NONE SEEN 10/21/2017   EPIU 6-10 10/21/2017   MUCOUSUACOMP PRESENT 01/02/2014    Nitrazine: negative Ferning: negative Pooling: negative  Wet prep and gonorrhea/chlamydia testing unable to be resulted due to a labeling error, per the lab.  Procedures: NST Baseline FHR: 130 beats/min Variability: moderate Accelerations: present (10 x 10) Decelerations: absent Tocometry: some activity early, quiet otherwise  Interpretation:  INDICATIONS: rule out uterine contractions RESULTS:  A NST procedure was performed with FHR monitoring and a normal baseline established, appropriate time of 20-40 minutes of evaluation, and accels >2 seen w 15x15 characteristics.  Results show a REACTIVE NST.     Discharge Condition: improved  Disposition: Discharge disposition: 01-Home or Self Care       Diet: Regular diet  Discharge Activity: Activity as tolerated   Allergies as of 10/21/2017   No Known Allergies     Medication List    TAKE these medications   prenatal multivitamin Tabs tablet Take 1 tablet by mouth daily at 12 noon.   ranitidine 150 MG tablet Commonly known as:  ZANTAC Take 1  tablet (150 mg total) by mouth 2 (two) times daily.        Total time spent taking care of this patient: 30 minutes  Signed: Thomasene Mohair, MD  10/21/2017, 7:00 AM

## 2017-10-21 NOTE — Discharge Instructions (Signed)
Come back to hospital with:  -contractions every 5 minutes  -vaginal bleeding  -leakage of fluid -decreased fetal movement

## 2017-10-22 ENCOUNTER — Ambulatory Visit: Payer: Medicaid Other

## 2017-10-26 DIAGNOSIS — O09213 Supervision of pregnancy with history of pre-term labor, third trimester: Secondary | ICD-10-CM | POA: Diagnosis not present

## 2017-10-26 DIAGNOSIS — O26893 Other specified pregnancy related conditions, third trimester: Secondary | ICD-10-CM | POA: Diagnosis not present

## 2017-10-26 DIAGNOSIS — Z3A29 29 weeks gestation of pregnancy: Secondary | ICD-10-CM | POA: Diagnosis not present

## 2017-10-26 DIAGNOSIS — R109 Unspecified abdominal pain: Secondary | ICD-10-CM | POA: Diagnosis not present

## 2017-10-27 ENCOUNTER — Observation Stay
Admission: EM | Admit: 2017-10-27 | Discharge: 2017-10-27 | Disposition: A | Discharge status: Home / Self Care | Payer: Medicaid Other | Attending: Obstetrics and Gynecology | Admitting: Obstetrics and Gynecology

## 2017-10-27 DIAGNOSIS — R05 Cough: Secondary | ICD-10-CM | POA: Insufficient documentation

## 2017-10-27 DIAGNOSIS — O26893 Other specified pregnancy related conditions, third trimester: Principal | ICD-10-CM | POA: Insufficient documentation

## 2017-10-27 DIAGNOSIS — R109 Unspecified abdominal pain: Secondary | ICD-10-CM | POA: Diagnosis not present

## 2017-10-27 DIAGNOSIS — Z3A29 29 weeks gestation of pregnancy: Secondary | ICD-10-CM | POA: Insufficient documentation

## 2017-10-27 LAB — URINALYSIS, COMPLETE (UACMP) WITH MICROSCOPIC
BACTERIA UA: NONE SEEN
Bilirubin Urine: NEGATIVE
Glucose, UA: NEGATIVE mg/dL
Hgb urine dipstick: NEGATIVE
KETONES UR: NEGATIVE mg/dL
LEUKOCYTES UA: NEGATIVE
Nitrite: NEGATIVE
PH: 7 (ref 5.0–8.0)
PROTEIN: NEGATIVE mg/dL
Specific Gravity, Urine: 1.021 (ref 1.005–1.030)

## 2017-10-27 MED ORDER — GUAIFENESIN-DM 100-10 MG/5ML PO SYRP
5.0000 mL | ORAL_SOLUTION | ORAL | Status: DC | PRN
  Administered 2017-10-27: 5 mL via ORAL
  Filled 2017-10-27 (×3): qty 5

## 2017-10-27 NOTE — Final Progress Note (Signed)
Triage Note  Maria Richmond is an 22 y.o. female.  HPI:  She was sleeping soundly when I entered the room.  She presents to triage today with complaints of abdominal pain. The pain is all over. She says that it is sharp. It hurts with fetal movement. She is having regular, daily, formed, bowel movments. She has a little bit of nausea this AM. She is tolerating a PO diet and has [employer] in the room. She reports that she did not sleep much overnight and is tired.  She has had a cough for over a week. She denies fevers. She has not taken anything for the cough. Unsure if anything in the vagina in the last 24 hours. Her partner denies sexual activity.  Denies bleeding, leakage of fluid, denies contractions.    G4 Problems (from 05/27/17 to present)    Problem Noted Resolved   Supervision of high risk pregnancy, antepartum 07/01/2017 by Conard Novak, MD No   Pregnancy complicated by tobacco use in first trimester 06/04/2017 by Oswaldo Conroy, CNM No   Short interval between pregnancies affecting pregnancy in first trimester, antepartum 06/04/2017 by Oswaldo Conroy, CNM No   Anxiety and depression 06/04/2017 by Oswaldo Conroy, CNM No   High risk pregnancy due to history of preterm labor 06/03/2017 by Oswaldo Conroy, CNM No   Overview Addendum 08/20/2017 12:10 PM by Natale Milch, MD    Clinic [facility] Prenatal Labs  Dating  7 wk [place] Blood type: O/Positive/-- (05/29 1422)   Genetic Screen  NIPS: normal Antibody:Negative (05/29 1422)  Anatomic [place] incomplete Rubella: <0.90 (05/29 1422) Nonimmune Varicella:  NonImmune  GTT Early:                Third trimester:  RPR: Non Reactive (05/29 1422)   Rhogam Not needed HBsAg: Negative (05/29 1422)   TDaP vaccine                       Flu Shot: HIV: Non Reactive (05/29 1422)   Baby Food                                GBS:   Contraception  Pap: 2019 NIL  CBB     CS/VBAC  CESAREAN @37  weeks   Support Person  [place] (FOB)              History of placenta abruption 06/25/2016 by Vena Austria, MD No   History of classical cesarean section 01/31/2016 by Jimmey Ralph, MD No   Overview Signed 01/31/2016  5:09 PM by Jimmey Ralph, MD    22 yo G1P0 admitted at [redacted]w[redacted]d after PPROM.  She was started on latency antibiotics.  Received ICN consult.  She had previously received BMZ and did not received rescue course on admission.  On HD#4 she began having vaginal bleeding.  She was placed on fetal heart rate monitor to ensure fetal well-being.  She continued to have bleeding and new onset abdominal pain.  Placental abruption was suspected and she underwent urgent Classical cesarean section.  See procedure note for details.    Findings at time of delivery: A female infant in breech presentation.  Amniotic fluid - Clear.  Birth weight 870 g.  Apgars of 6 and 8. Arterial pH = 7.31, Venous pH = 7.32.  Placenta with a three-vessel cord, approximately 30% abruption with evidence of old clots in  the uterus. Grossly normal uterus, tubes and ovaries bilaterally. Postpartum course complicated by neonatal demise.  Patient was taken to ICN at the time of coding and elected coding to stop and rather, to hold her baby      History of preterm delivery 09/06/2014 by Tresea Mall, CNM No   Overview Signed 07/08/2016  3:20 PM by Tresea Mall, CNM    Overview:  With PPROM at 26 wks. 22 yo G1P0 admitted at [redacted]w[redacted]d after PPROM.  She was started on latency antibiotics.  Received ICN consult.  She had previously received BMZ and did not received rescue course on admission.  On HD#4 she began having vaginal bleeding.  She was placed on fetal heart rate monitor to ensure fetal well-being.  She continued to have bleeding and new onset abdominal pain.  Placental abruption was suspected and she underwent urgent Classical cesarean section.  See procedure note for details.    Findings at time of delivery: A female infant in breech  presentation.  Amniotic fluid - Clear.  Birth weight 870 g.  Apgars of 6 and 8. Arterial pH = 7.31, Venous pH = 7.32.  Placenta with a three-vessel cord, approximately 30% abruption with evidence of old clots in the uterus. Grossly normal uterus, tubes and ovaries bilaterally. Postpartum course complicated by neonatal demise.  Patient was taken to ICN at the time of coding and elected coding to stop and rather, to hold her baby          Past Medical History:  Diagnosis Date  . Depression   . Medical history non-contributory   . Preterm labor     Past Surgical History:  Procedure Laterality Date  . CESAREAN SECTION    . CESAREAN SECTION N/A 07/17/2016   Procedure: CESAREAN SECTION, Female, 6lb, 6oz;  Surgeon: Conard Novak, MD;  Location: [facility] ORS;  Service: Obstetrics;  Laterality: N/A;    Family History  Problem Relation Age of Onset  . Diabetes Maternal Grandmother   . Hypertension Maternal Grandmother   . Heart Problems Maternal Grandmother   . Stroke Maternal Grandmother   . Thyroid disease Maternal Grandmother     Social History:  reports that she has quit smoking. Her smoking use included cigarettes. She smoked 1.00 pack per day. She has never used smokeless tobacco. She reports that she does not drink alcohol or use drugs.  Allergies: No Known Allergies  Medications: I have reviewed the patient's current medications.  No results found for this or any previous visit (from the past 48 hour(s)).  No results found.  Review of Systems  Constitutional: Negative for chills, fever, malaise/fatigue and weight loss.  HENT: Negative for congestion, hearing loss and sinus pain.   Eyes: Negative for blurred vision and double vision.  Respiratory: Negative for cough, sputum production, shortness of breath and wheezing.   Cardiovascular: Negative for chest pain, palpitations, orthopnea and leg swelling.  Gastrointestinal: Negative for abdominal pain, constipation, diarrhea,  nausea and vomiting.  Genitourinary: Negative for dysuria, flank pain, frequency, hematuria and urgency.  Musculoskeletal: Negative for back pain, falls and joint pain.  Skin: Negative for itching and rash.  Neurological: Negative for dizziness and headaches.  Psychiatric/Behavioral: Negative for depression, substance abuse and suicidal ideas. The patient is not nervous/anxious.    Blood pressure 117/80, pulse (!) 111, resp. rate 20, last menstrual period 01/07/2017, unknown if currently breastfeeding. Physical Exam  Nursing note and vitals reviewed. Constitutional: She is oriented to person, place, and time. She appears well-developed  and well-nourished.  HENT:  Head: Normocephalic and atraumatic.  Cardiovascular: Normal rate and regular rhythm.  Respiratory: Effort normal and breath sounds normal. No respiratory distress. She has no wheezes. She has no rales. She exhibits no tenderness.  GI: Soft. Bowel sounds are normal. She exhibits no distension and no mass. There is tenderness. There is no rebound and no guarding.  Small umbilical hernia, no bowel present.  Genitourinary:  Genitourinary Comments: Closed, long, high.  Musculoskeletal: Normal range of motion.  Neurological: She is alert and oriented to person, place, and time.  Skin: Skin is warm and dry.  Psychiatric: She has a normal mood and affect. Her behavior is normal. Judgment and thought content normal.   NST: 135 bpm baseline, moderate variability, no decelerations  Assessment/Plan: 22 yo [id number] [redacted]w[redacted]d  Cough and abdominal pain- nonproductive cough- will treat with robitussin. Likely causing the abdominal pain. Discussed using a pillow held against her stomach to help with the cough. Discussed supportive measures like robitussin, mucinex, and cough drops.    Lannis Lichtenwalner R Eulas Schweitzer 10/27/2017, 1:55 PM

## 2017-10-27 NOTE — OB Triage Note (Signed)
Pt here with compliants of abdominal pain that started this morning at 1 am and a cough that has lasted 1 week. Denies sudden gush of fluid,  Or vaginal bleeding. Positive for fetal movement. Denies sexual intercourse in last 24 hours.  Pt connected to external fetal monitor - FHR ; toco applied, abd. Soft. Clean catch urine collected for additional test if needed.

## 2017-10-27 NOTE — Progress Notes (Signed)
Discharge instructions given to and reviewed with patient. Follow up appointment on 10/24 with Tresea Mall, CNM.  Monitors removed, patient getting dressed, will call for wheelchair when patient is ready.

## 2017-10-27 NOTE — Discharge Summary (Signed)
  Please see final progress note.  Adelene Idler MD [facility], [facility] 10/27/17 2:09 PM

## 2017-10-28 LAB — URINE CULTURE

## 2017-10-29 ENCOUNTER — Encounter: Payer: Medicaid Other | Admitting: Advanced Practice Midwife

## 2017-11-02 ENCOUNTER — Encounter: Payer: Medicaid Other | Admitting: Obstetrics and Gynecology

## 2017-11-05 IMAGING — US US MFM FETAL NUCHAL TRANSLUCENCY
1 series · 13 of 28 positions shown · non-contrast
Comparison: none

PATIENT INFO:

PERFORMED BY:
CHA
SERVICE(S) PROVIDED:
INDICATIONS:
13 weeks gestation of pregnancy
FETAL EVALUATION:
Num Of Fetuses:     1
Gest. Sac:          Seen
Yolk Sac:           Not visualized
Fetal Pole:         Visualized
Fetal Heart         155
Rate(bpm):
Cardiac Activity:   Present
Presentation:       Cephalic
Placenta:           Posterior, fundal
BIOMETRY:
CRL:      68.5  mm     G. Age:  13w 1d                  EDD:    08/06/16
GESTATIONAL AGE:
Best:          13w 1d     Det. By:  U/S C R L (01/31/16)     EDD:   08/06/16
1ST TRIMESTER GENETIC SONOGRAM SCREENING:
CRL:            68.5  mm    G. Age:   13w 1d                 EDD:   08/06/16
ANATOMY:
Stomach:               Seen                   Spine:                  Suboptimal views
Bladder:               Seen
CERVIX UTERUS ADNEXA:
Cervix
Length:            3.2  cm.
Right Ovary
Size(cm)       2.8  x   1.2    x  1.9       Vol(ml):

[Series 1: us mfm fetal nuchal translucency · 0.23mm/px · 13 of 49 slices shown]
[im 2/49]
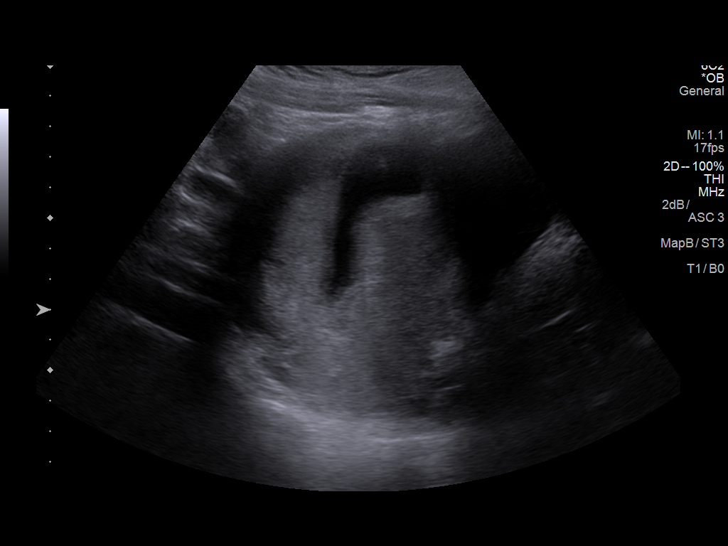
[im 6/49]
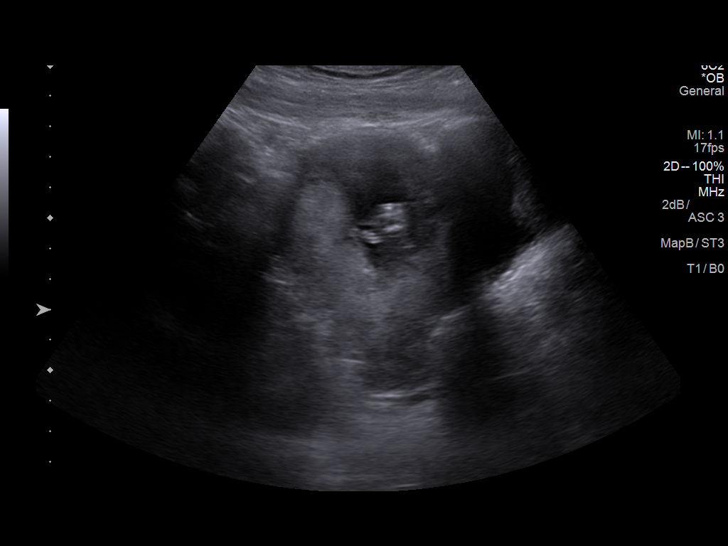
[im 9/49]
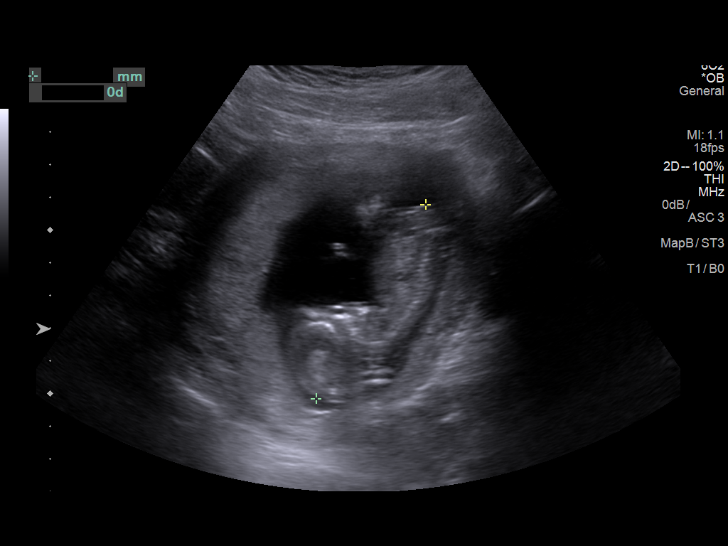
[im 13/49]
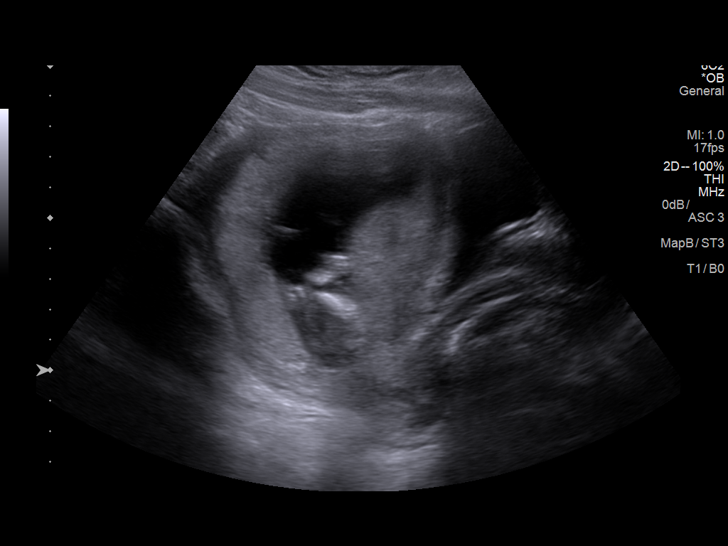
[im 17/49]
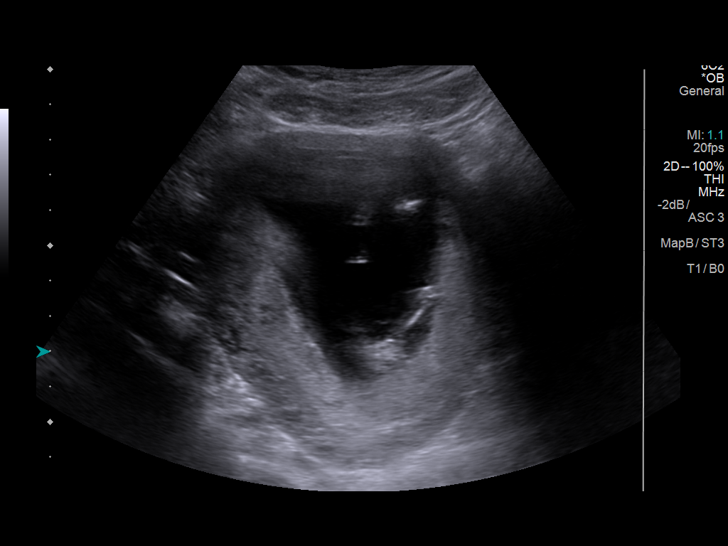
[im 20/49]
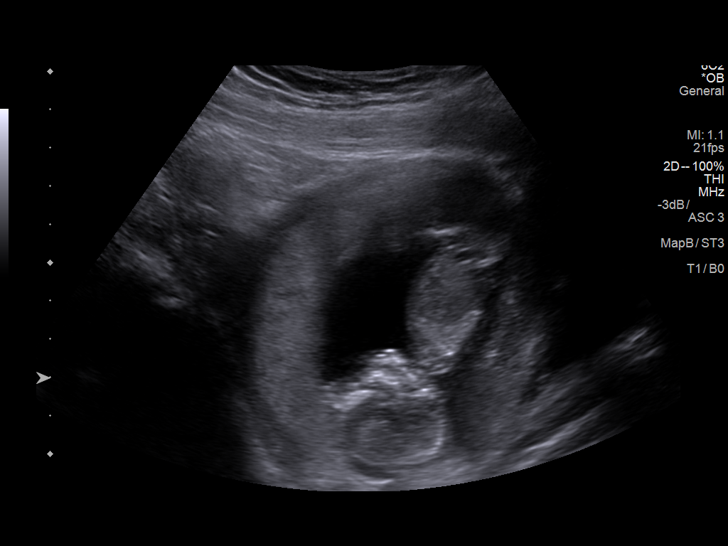
[im 25/49]
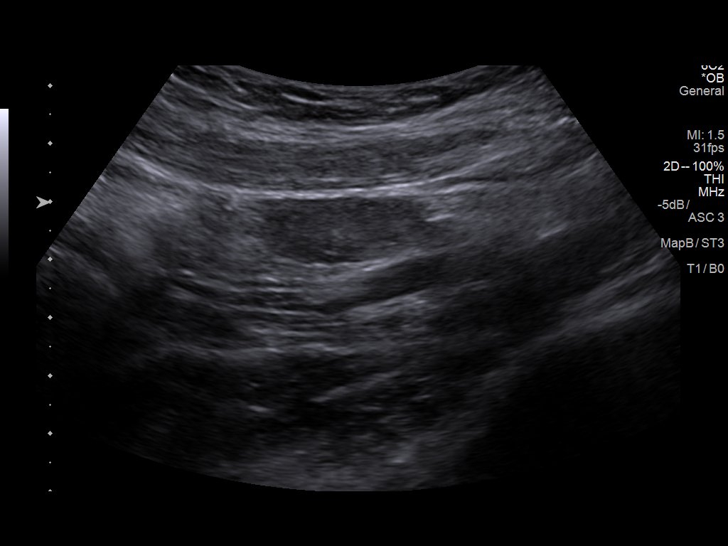
[im 29/49]
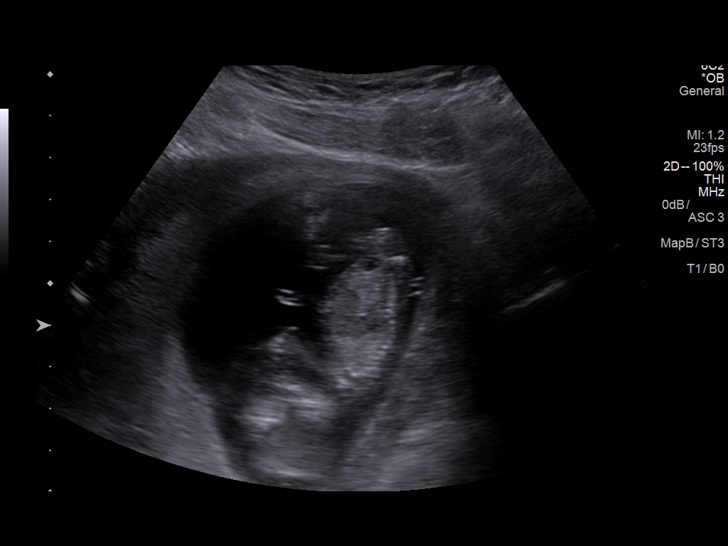
[im 33/49]
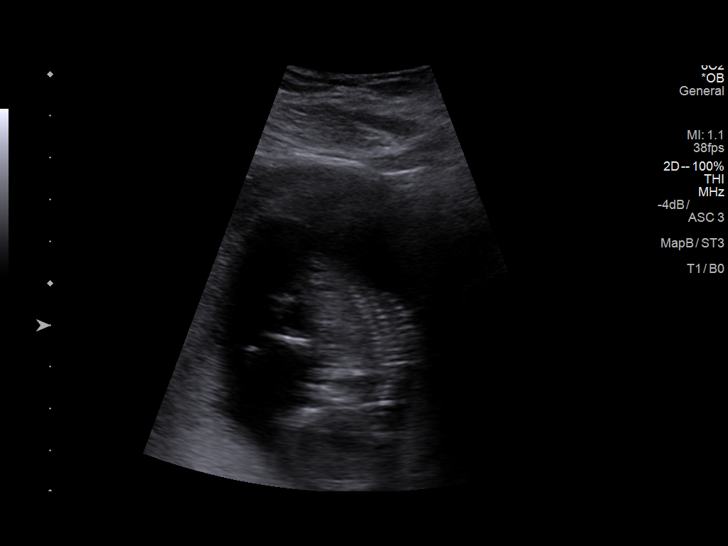
[im 36/49]
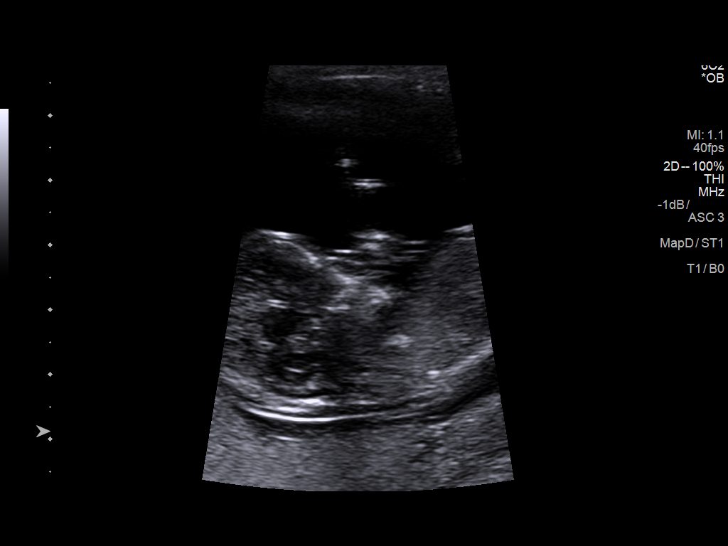
[im 40/49]
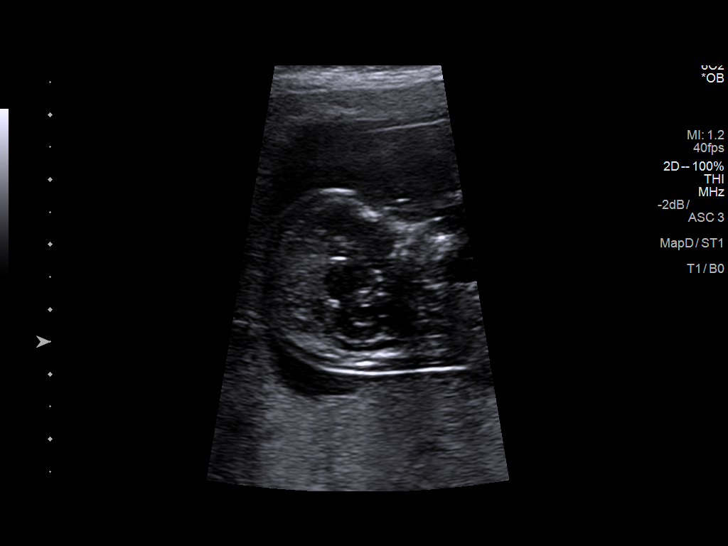
[im 43/49]
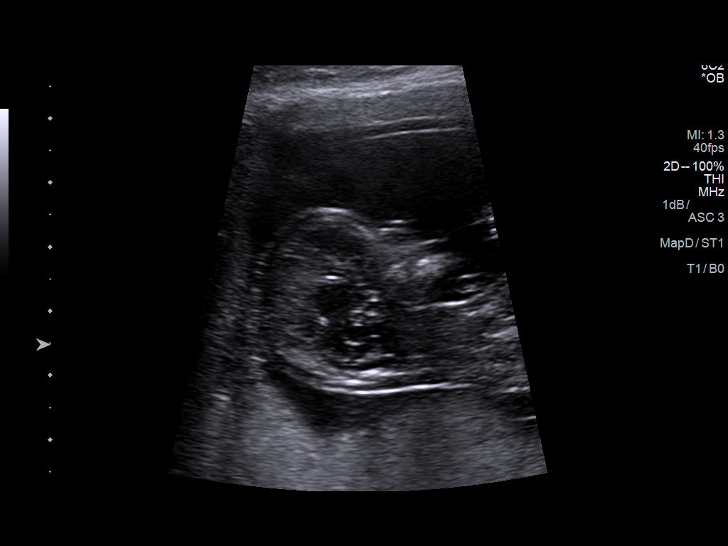
[im 47/49]
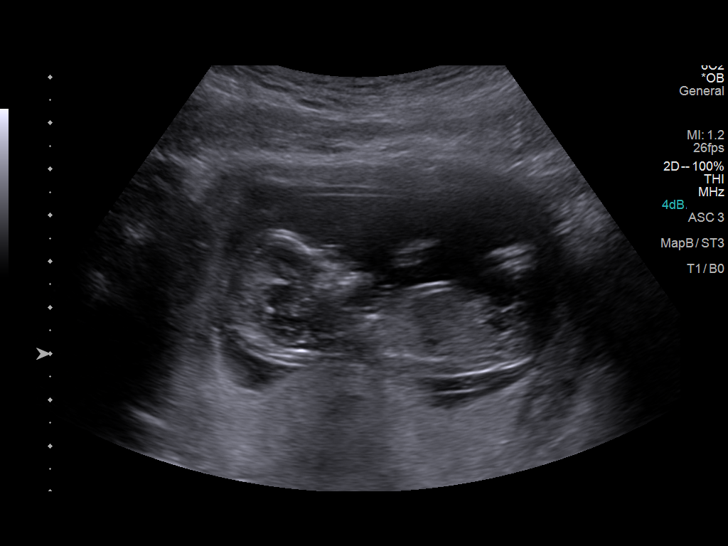

[13 of 28 positions shown; findings below may reference images not displayed]

IMPRESSION: Dear Dr. CHA,

Thank you for referring your patient  for first trimester nuchal
translucency screening.

A singleton gestation is noted.

The fetal biometry correlates with

The nuchal translucency measured  2.1 mm.

The fetal nasal bone was noted to be present.

The ovaries were WNL.

The patient was scheduled to have blood drawn for first
trimester screening.

Pt is at high risk of preterm delivery - I ordered a 16 week
cervical length scan.

## 2017-11-11 ENCOUNTER — Encounter: Payer: Medicaid Other | Admitting: Obstetrics and Gynecology

## 2017-11-26 ENCOUNTER — Observation Stay
Admission: EM | Admit: 2017-11-26 | Discharge: 2017-11-26 | Disposition: A | Discharge status: Home / Self Care | Payer: Medicaid Other | Attending: Obstetrics and Gynecology | Admitting: Obstetrics and Gynecology

## 2017-11-26 ENCOUNTER — Other Ambulatory Visit: Payer: Self-pay

## 2017-11-26 DIAGNOSIS — Z87891 Personal history of nicotine dependence: Secondary | ICD-10-CM | POA: Insufficient documentation

## 2017-11-26 DIAGNOSIS — M79652 Pain in left thigh: Secondary | ICD-10-CM | POA: Diagnosis not present

## 2017-11-26 DIAGNOSIS — Z3A33 33 weeks gestation of pregnancy: Secondary | ICD-10-CM | POA: Insufficient documentation

## 2017-11-26 DIAGNOSIS — M79651 Pain in right thigh: Secondary | ICD-10-CM | POA: Diagnosis not present

## 2017-11-26 DIAGNOSIS — O99331 Smoking (tobacco) complicating pregnancy, first trimester: Secondary | ICD-10-CM

## 2017-11-26 DIAGNOSIS — F419 Anxiety disorder, unspecified: Secondary | ICD-10-CM

## 2017-11-26 DIAGNOSIS — O26893 Other specified pregnancy related conditions, third trimester: Secondary | ICD-10-CM | POA: Diagnosis not present

## 2017-11-26 DIAGNOSIS — O0990 Supervision of high risk pregnancy, unspecified, unspecified trimester: Secondary | ICD-10-CM

## 2017-11-26 DIAGNOSIS — Z8759 Personal history of other complications of pregnancy, childbirth and the puerperium: Secondary | ICD-10-CM

## 2017-11-26 DIAGNOSIS — Z98891 History of uterine scar from previous surgery: Secondary | ICD-10-CM

## 2017-11-26 DIAGNOSIS — F32A Depression, unspecified: Secondary | ICD-10-CM

## 2017-11-26 DIAGNOSIS — F329 Major depressive disorder, single episode, unspecified: Secondary | ICD-10-CM

## 2017-11-26 DIAGNOSIS — O09891 Supervision of other high risk pregnancies, first trimester: Secondary | ICD-10-CM

## 2017-11-26 DIAGNOSIS — Z8751 Personal history of pre-term labor: Secondary | ICD-10-CM

## 2017-11-26 LAB — URINALYSIS, COMPLETE (UACMP) WITH MICROSCOPIC
Bilirubin Urine: NEGATIVE
GLUCOSE, UA: NEGATIVE mg/dL
HGB URINE DIPSTICK: NEGATIVE
Ketones, ur: NEGATIVE mg/dL
Nitrite: NEGATIVE
Protein, ur: NEGATIVE mg/dL
SPECIFIC GRAVITY, URINE: 1.015 (ref 1.005–1.030)
pH: 7 (ref 5.0–8.0)

## 2017-11-26 NOTE — Discharge Summary (Signed)
See Final Progress Note 

## 2017-11-26 NOTE — Final Progress Note (Signed)
Physician Final Progress Note  Patient ID: Maria Richmond MRN: [id number] DOB/AGE: 08/20/1995 22 y.o.  Admit date: 11/26/2017 Admitting provider: Conard Novak, MD Discharge date: 11/26/2017   Admission Diagnoses:  1) intrauterine pregnancy at [redacted]w[redacted]d 2) bilateral thigh leg pain  Discharge Diagnoses:  1) intrauterine pregnancy at [redacted]w[redacted]d 2) bilateral thigh leg pain  History of Present Illness: The patient is a 22 y.o. female [phone] at [redacted]w[redacted]d who presents for recent-onset of bilteral upper thigh pain that started yesterday.  She states the pain is located just below her inguinal crease.  The pain feels like an ache.  It is rated a 5/10 currently, but 10/10 prior to arrival.  Alleviating factors: none. Aggravating factors: none. Associated symptoms: none.  She notes +FM, no LOF, no vaginal bleeding. She denies contractions.     Past Medical History:  Diagnosis Date  . Depression   . Medical history non-contributory   . Preterm labor     Past Surgical History:  Procedure Laterality Date  . CESAREAN SECTION    . CESAREAN SECTION N/A 07/17/2016   Procedure: CESAREAN SECTION, Female, 6lb, 6oz;  Surgeon: Conard Novak, MD;  Location: [facility] ORS;  Service: Obstetrics;  Laterality: N/A;    No current facility-administered medications on file prior to encounter.    Current Outpatient Medications on File Prior to Encounter  Medication Sig Dispense Refill  . Prenatal Vit-Fe Fumarate-FA (PRENATAL MULTIVITAMIN) TABS tablet Take 1 tablet by mouth daily at 12 noon.    . ranitidine (ZANTAC) 150 MG tablet Take 1 tablet (150 mg total) by mouth 2 (two) times daily. (Patient not taking: Reported on 10/27/2017) 60 tablet 1    No Known Allergies  Social History   Socioeconomic History  . Marital status: Single    Spouse name: Not on file  . Number of children: 1  . Years of education: 15  . Highest education level: Not on file  Occupational History  . Occupation: UNEMPLOYED   Social Needs  . Financial resource strain: Not on file  . Food insecurity:    Worry: Not on file    Inability: Not on file  . Transportation needs:    Medical: Not on file    Non-medical: Not on file  Tobacco Use  . Smoking status: Former Smoker    Packs/day: 1.00    Types: Cigarettes  . Smokeless tobacco: Never Used  Substance and Sexual Activity  . Alcohol use: No  . Drug use: No    Comment: positive uds for MJ, states no use recently  . Sexual activity: Yes    Birth control/protection: None  Lifestyle  . Physical activity:    Days per week: Not on file    Minutes per session: Not on file  . Stress: Not on file  Relationships  . Social connections:    Talks on phone: Not on file    Gets together: Not on file    Attends religious service: Not on file    Active member of club or organization: Not on file    Attends meetings of clubs or organizations: Not on file    Relationship status: Not on file  . Intimate partner violence:    Fear of current or ex partner: Not on file    Emotionally abused: Not on file    Physically abused: Not on file    Forced sexual activity: Not on file  Other Topics Concern  . Not on file  Social History Narrative   **  Merged History Encounter **        Family History  Problem Relation Age of Onset  . Diabetes Maternal Grandmother   . Hypertension Maternal Grandmother   . Heart Problems Maternal Grandmother   . Stroke Maternal Grandmother   . Thyroid disease Maternal Grandmother      Review of Systems  Constitutional: Negative.   HENT: Negative.   Eyes: Negative.   Respiratory: Negative.   Cardiovascular: Negative.   Gastrointestinal: Negative.   Genitourinary: Negative.   Musculoskeletal: Negative.        Upper thigh pain, "muscular"  Skin: Negative.   Neurological: Negative.   Psychiatric/Behavioral: Negative.      Physical Exam: BP 114/68 (BP Location: Left Arm)   Pulse (!) 116   Temp 98.6 F (37 C) (Oral)   Resp  19   Ht 5\' 2"  (1.575 m)   Wt 79 kg   LMP 01/07/2017   BMI 31.85 kg/m   Physical Exam  Constitutional: She is oriented to person, place, and time. She appears well-developed and well-nourished. No distress.  HENT:  Head: Normocephalic and atraumatic.  Eyes: Conjunctivae are normal. No scleral icterus.  Pulmonary/Chest: Breath sounds normal. No respiratory distress.  Abdominal: Soft. Bowel sounds are normal.  Gravid, NT. No tenderness over old incision line.  Musculoskeletal: Normal range of motion. She exhibits no edema.  No evidence of inguinal hernia bilaterally. No abdominal nor thigh tenderness. I can not reproduce the pain.    Neurological: She is alert and oriented to person, place, and time. No cranial nerve deficit.  Skin: Skin is warm and dry. No erythema.  Psychiatric: She has a normal mood and affect. Her behavior is normal. Judgment normal.    Consults: None  Significant Findings/ Diagnostic Studies: UA pending  Procedures: NST Baseline FHR: 135 beats/min Variability: moderate Accelerations: present Decelerations: absent Tocometry: quiet  Interpretation:  INDICATIONS: patient reassurance RESULTS:  A NST procedure was performed with FHR monitoring and a normal baseline established, appropriate time of 20-40 minutes of evaluation, and accels >2 seen w 15x15 characteristics.  Results show a REACTIVE NST.    Hospital Course: The patient was admitted to Labor and Delivery Triage for observation. She had normal vital signs. The fetal tracing was reactive.  She was evaluated and appears to have upper thigh pain without concern for labor, bleeding, leakage of fluid, or fetal issues.  She was provided reassurance and STRONGLY encouraged to keep her prenatal appointment for tomorrow at 11:30 AM where I emphasized that she needs to have her c-section scheduled (history of classical).  She was encouraged to return to the hospital for any concerning symptoms, including;  contractions, abdominal pain, vaginal bleeding, decreased fetal movement, loss of fluid, or any other concerning symptom.   Discharge Condition: stable  Disposition: Discharge disposition: 01-Home or Self Care       Diet: Regular diet  Discharge Activity: Activity as tolerated   Allergies as of 11/26/2017   No Known Allergies     Medication List    STOP taking these medications   ranitidine 150 MG tablet Commonly known as:  ZANTAC     TAKE these medications   prenatal multivitamin Tabs tablet Take 1 tablet by mouth daily at 12 noon.      Follow-up Information    Farrel Conners, CNM. Go on 11/27/2017.   Specialty:  Certified Nurse Midwife Why:  11:30 AM tomorrow, keep previously scheduled appointment Contact information: [address] [place] [address] [phone]  Total time spent taking care of this patient: 25 minutes  Signed: Thomasene Mohair, MD  11/26/2017, 6:13 PM

## 2017-11-27 ENCOUNTER — Ambulatory Visit (INDEPENDENT_AMBULATORY_CARE_PROVIDER_SITE_OTHER): Payer: Medicaid Other | Admitting: Certified Nurse Midwife

## 2017-11-27 VITALS — BP 122/66 | Wt 179.0 lb

## 2017-11-27 DIAGNOSIS — O09213 Supervision of pregnancy with history of pre-term labor, third trimester: Secondary | ICD-10-CM

## 2017-11-27 DIAGNOSIS — Z9119 Patient's noncompliance with other medical treatment and regimen: Secondary | ICD-10-CM

## 2017-11-27 DIAGNOSIS — O0993 Supervision of high risk pregnancy, unspecified, third trimester: Secondary | ICD-10-CM

## 2017-11-27 DIAGNOSIS — Z91199 Patient's noncompliance with other medical treatment and regimen due to unspecified reason: Secondary | ICD-10-CM

## 2017-11-27 DIAGNOSIS — Z87898 Personal history of other specified conditions: Secondary | ICD-10-CM

## 2017-11-27 DIAGNOSIS — O09893 Supervision of other high risk pregnancies, third trimester: Secondary | ICD-10-CM

## 2017-11-27 DIAGNOSIS — O0990 Supervision of high risk pregnancy, unspecified, unspecified trimester: Secondary | ICD-10-CM

## 2017-11-27 DIAGNOSIS — Z3A34 34 weeks gestation of pregnancy: Secondary | ICD-10-CM | POA: Diagnosis not present

## 2017-11-27 DIAGNOSIS — Z98891 History of uterine scar from previous surgery: Secondary | ICD-10-CM

## 2017-11-27 DIAGNOSIS — F1291 Cannabis use, unspecified, in remission: Secondary | ICD-10-CM

## 2017-11-27 DIAGNOSIS — O34219 Maternal care for unspecified type scar from previous cesarean delivery: Secondary | ICD-10-CM

## 2017-11-27 LAB — POCT URINALYSIS DIPSTICK OB
GLUCOSE, UA: NEGATIVE
POC,PROTEIN,UA: NEGATIVE

## 2017-11-27 MED ORDER — HYDROXYPROGESTERONE CAPROATE 250 MG/ML IM OIL
250.0000 mg | TOPICAL_OIL | Freq: Once | INTRAMUSCULAR | Status: AC
  Administered 2017-11-27: 250 mg via INTRAMUSCULAR

## 2017-11-27 NOTE — Progress Notes (Signed)
ROB 17P injection

## 2017-11-28 LAB — URINE DRUG PANEL 7
AMPHETAMINES, URINE: NEGATIVE ng/mL
BARBITURATE QUANT UR: NEGATIVE ng/mL
BENZODIAZEPINE QUANT UR: NEGATIVE ng/mL
COCAINE (METAB.): NEGATIVE ng/mL
Cannabinoid Quant, Ur: NEGATIVE ng/mL
Opiate Quant, Ur: NEGATIVE ng/mL
PCP Quant, Ur: NEGATIVE ng/mL

## 2017-11-28 NOTE — Progress Notes (Addendum)
HROB at 34 weeks. Hx of Classical Cesarean section at 26 weeks for abruption and neonatal loss. Has not been seen in office since 28 weeks. Last Makena injection  at that time.  Has been seen in L&D several times, most recently yesterday for bilateral thigh pain. Baby active. No vaginal bleeding or leakage of water. FHTs WNL. FH 34 cm After consultation with Dr Tiburcio PeaHarris and Dr Jerene PitchSchuman, repeat CS has been scheduled for 12/18/17 at 37 weeks with DR Jerene PitchSchuman (initially tried to schedule 12/12, but could not due to full OR schedule). Last TDAp at 05/13/16, declines TDAP and flu vaccine today Interested in breast feeding Dscussed pp contraception-considering Depo/Nexplanon/ IUD. Discussed possibility of having IUD inserted in the OR after delivery of placenta. Makena today ROB/ growth scan/ Makena in 1 week UDS today  Maria Richmond, CNM

## 2017-12-07 ENCOUNTER — Ambulatory Visit (INDEPENDENT_AMBULATORY_CARE_PROVIDER_SITE_OTHER): Payer: Medicaid Other | Admitting: Obstetrics and Gynecology

## 2017-12-07 ENCOUNTER — Other Ambulatory Visit (HOSPITAL_COMMUNITY)
Admission: RE | Admit: 2017-12-07 | Discharge: 2017-12-07 | Disposition: A | Discharge status: Home / Self Care | Payer: Medicaid Other | Source: Ambulatory Visit | Attending: Obstetrics and Gynecology | Admitting: Obstetrics and Gynecology

## 2017-12-07 ENCOUNTER — Encounter: Payer: Self-pay | Admitting: Obstetrics and Gynecology

## 2017-12-07 ENCOUNTER — Ambulatory Visit (INDEPENDENT_AMBULATORY_CARE_PROVIDER_SITE_OTHER): Payer: Medicaid Other

## 2017-12-07 VITALS — BP 122/80 | Wt 183.0 lb

## 2017-12-07 DIAGNOSIS — O0990 Supervision of high risk pregnancy, unspecified, unspecified trimester: Secondary | ICD-10-CM | POA: Insufficient documentation

## 2017-12-07 DIAGNOSIS — O99331 Smoking (tobacco) complicating pregnancy, first trimester: Secondary | ICD-10-CM

## 2017-12-07 DIAGNOSIS — Z91199 Patient's noncompliance with other medical treatment and regimen due to unspecified reason: Secondary | ICD-10-CM

## 2017-12-07 DIAGNOSIS — Z362 Encounter for other antenatal screening follow-up: Secondary | ICD-10-CM

## 2017-12-07 DIAGNOSIS — F329 Major depressive disorder, single episode, unspecified: Secondary | ICD-10-CM

## 2017-12-07 DIAGNOSIS — Z3A35 35 weeks gestation of pregnancy: Secondary | ICD-10-CM | POA: Insufficient documentation

## 2017-12-07 DIAGNOSIS — O99333 Smoking (tobacco) complicating pregnancy, third trimester: Secondary | ICD-10-CM

## 2017-12-07 DIAGNOSIS — O09213 Supervision of pregnancy with history of pre-term labor, third trimester: Secondary | ICD-10-CM

## 2017-12-07 DIAGNOSIS — O34219 Maternal care for unspecified type scar from previous cesarean delivery: Secondary | ICD-10-CM

## 2017-12-07 DIAGNOSIS — O09891 Supervision of other high risk pregnancies, first trimester: Secondary | ICD-10-CM

## 2017-12-07 DIAGNOSIS — Z98891 History of uterine scar from previous surgery: Secondary | ICD-10-CM

## 2017-12-07 DIAGNOSIS — Z8759 Personal history of other complications of pregnancy, childbirth and the puerperium: Secondary | ICD-10-CM

## 2017-12-07 DIAGNOSIS — Z3A34 34 weeks gestation of pregnancy: Secondary | ICD-10-CM

## 2017-12-07 DIAGNOSIS — Z9119 Patient's noncompliance with other medical treatment and regimen: Secondary | ICD-10-CM

## 2017-12-07 DIAGNOSIS — F32A Depression, unspecified: Secondary | ICD-10-CM

## 2017-12-07 DIAGNOSIS — F419 Anxiety disorder, unspecified: Secondary | ICD-10-CM

## 2017-12-07 DIAGNOSIS — O09293 Supervision of pregnancy with other poor reproductive or obstetric history, third trimester: Secondary | ICD-10-CM

## 2017-12-07 DIAGNOSIS — Z8751 Personal history of pre-term labor: Secondary | ICD-10-CM

## 2017-12-07 MED ORDER — HYDROXYPROGESTERONE CAPROATE 250 MG/ML IM OIL
250.0000 mg | TOPICAL_OIL | Freq: Once | INTRAMUSCULAR | Status: AC
  Administered 2017-12-07: 250 mg via INTRAMUSCULAR
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
## 2017-12-09 LAB — CERVICOVAGINAL ANCILLARY ONLY
CHLAMYDIA, DNA PROBE: NEGATIVE
Neisseria Gonorrhea: NEGATIVE

## 2017-12-09 LAB — STREP GP B NAA: Strep Gp B NAA: POSITIVE — AB

## 2017-12-17 ENCOUNTER — Other Ambulatory Visit: Payer: Self-pay

## 2017-12-17 ENCOUNTER — Ambulatory Visit (INDEPENDENT_AMBULATORY_CARE_PROVIDER_SITE_OTHER): Payer: Medicaid Other | Admitting: Obstetrics and Gynecology

## 2017-12-17 ENCOUNTER — Encounter: Payer: Self-pay | Admitting: Obstetrics and Gynecology

## 2017-12-17 ENCOUNTER — Encounter
Admission: RE | Admit: 2017-12-17 | Discharge: 2017-12-17 | Disposition: A | Discharge status: Home / Self Care | Payer: Medicaid Other | Source: Ambulatory Visit | Attending: Obstetrics and Gynecology | Admitting: Obstetrics and Gynecology

## 2017-12-17 VITALS — BP 130/84 | Ht 62.0 in | Wt 188.0 lb

## 2017-12-17 DIAGNOSIS — Z3A36 36 weeks gestation of pregnancy: Secondary | ICD-10-CM

## 2017-12-17 DIAGNOSIS — O0990 Supervision of high risk pregnancy, unspecified, unspecified trimester: Secondary | ICD-10-CM

## 2017-12-17 DIAGNOSIS — Z3A37 37 weeks gestation of pregnancy: Secondary | ICD-10-CM

## 2017-12-17 DIAGNOSIS — O09891 Supervision of other high risk pregnancies, first trimester: Secondary | ICD-10-CM

## 2017-12-17 DIAGNOSIS — O09893 Supervision of other high risk pregnancies, third trimester: Secondary | ICD-10-CM

## 2017-12-17 DIAGNOSIS — Z01812 Encounter for preprocedural laboratory examination: Secondary | ICD-10-CM

## 2017-12-17 DIAGNOSIS — O34219 Maternal care for unspecified type scar from previous cesarean delivery: Secondary | ICD-10-CM

## 2017-12-17 DIAGNOSIS — Z98891 History of uterine scar from previous surgery: Secondary | ICD-10-CM

## 2017-12-17 HISTORY — DX: Gastro-esophageal reflux disease without esophagitis: K21.9

## 2017-12-17 LAB — TYPE AND SCREEN
ABO/RH(D): O POS
Antibody Screen: NEGATIVE
EXTEND SAMPLE REASON: UNDETERMINED

## 2017-12-17 LAB — CBC
HCT: 32.5 % — ABNORMAL LOW (ref 36.0–46.0)
Hemoglobin: 10.3 g/dL — ABNORMAL LOW (ref 12.0–15.0)
MCH: 28.1 pg (ref 26.0–34.0)
MCHC: 31.7 g/dL (ref 30.0–36.0)
MCV: 88.6 fL (ref 80.0–100.0)
Platelets: 158 10*3/uL (ref 150–400)
RBC: 3.67 MIL/uL — ABNORMAL LOW (ref 3.87–5.11)
RDW: 12.9 % (ref 11.5–15.5)
WBC: 8.1 10*3/uL (ref 4.0–10.5)
nRBC: 0 % (ref 0.0–0.2)

## 2017-12-17 NOTE — Patient Instructions (Signed)
Your procedure is scheduled on: 12/18/17 Fri arrive at 10:00 am Report to medical mall registration then to 3rd floor labor and delivery  Remember: Instructions that are not followed completely may result in serious medical risk, up to and including death, or upon the discretion of your surgeon and anesthesiologist your surgery may need to be rescheduled.    _x___ 1. Do not eat food after midnight the night before your procedure. You may drink clear liquids up to 2 hours before you are scheduled to arrive at the hospital for your procedure.  Do not drink clear liquids within 2 hours of your scheduled arrival to the hospital.  Clear liquids include  --Water or Apple juice without pulp  --Clear carbohydrate beverage such as ClearFast or Gatorade  --Black Coffee or Clear Tea (No milk, no creamers, do not add anything to                  the coffee or Tea Type 1 and type 2 diabetics should only drink water.   ____Ensure clear carbohydrate drink on the way to the hospital for bariatric patients  ____Ensure clear carbohydrate drink 3 hours before surgery for Dr Rutherford NailByrnett's patients if physician instructed.   No gum chewing or hard candies.     __x__ 2. No Alcohol for 24 hours before or after surgery.   __x__3. No Smoking or e-cigarettes for 24 prior to surgery.  Do not use any chewable tobacco products for at least 6 hour prior to surgery   ____  4. Bring all medications with you on the day of surgery if instructed.    __x__ 5. Notify your doctor if there is any change in your medical condition     (cold, fever, infections).    x___6. On the morning of surgery brush your teeth with toothpaste and water.  You may rinse your mouth with mouth wash if you wish.  Do not swallow any toothpaste or mouthwash.   Do not wear jewelry, make-up, hairpins, clips or nail polish.  Do not wear lotions, powders, or perfumes. You may wear deodorant.  Do not shave 48 hours prior to surgery. Men may shave face  and neck.  Do not bring valuables to the hospital.    [facility] is not responsible for any belongings or valuables.               Contacts, dentures or bridgework may not be worn into surgery.  Leave your suitcase in the car. After surgery it may be brought to your room.  For patients admitted to the hospital, discharge time is determined by your                       treatment team.  _  Patients discharged the day of surgery will not be allowed to drive home.  You will need someone to drive you home and stay with you the night of your procedure.    Please read over the following fact sheets that you were given:   [facility] Preparing for Surgery and or MRSA Information   _x___ Take anti-hypertensive listed below, cardiac, seizure, asthma,     anti-reflux and psychiatric medicines. These include:  1. None  2.  3.  4.  5.  6.  ____Fleets enema or Magnesium Citrate as directed.   _x___ Use CHG Soap or sage wipes as directed on instruction sheet   ____ Use inhalers on the day of surgery and bring  to hospital day of surgery  ____ Stop Metformin and Janumet 2 days prior to surgery.    ____ Take 1/2 of usual insulin dose the night before surgery and none on the morning     surgery.   _x___ Follow recommendations from Cardiologist, Pulmonologist or PCP regarding          stopping Aspirin, Coumadin, Plavix ,Eliquis, Effient, or Pradaxa, and Pletal.  X____Stop Anti-inflammatories such as Advil, Aleve, Ibuprofen, Motrin, Naproxen, Naprosyn, Goodies powders or aspirin products. OK to take Tylenol and                          Celebrex.   _x___ Stop supplements until after surgery.  But may continue Vitamin D, Vitamin B,       and multivitamin.   ____ Bring C-Pap to the hospital.
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
## 2017-12-18 ENCOUNTER — Inpatient Hospital Stay: Payer: Medicaid Other | Admitting: Anesthesiology

## 2017-12-18 ENCOUNTER — Inpatient Hospital Stay
Admission: RE | Admit: 2017-12-18 | Discharge: 2017-12-21 | DRG: 787 | Disposition: A | Discharge status: Home / Self Care | Payer: Medicaid Other | Attending: Obstetrics and Gynecology | Admitting: Obstetrics and Gynecology

## 2017-12-18 ENCOUNTER — Other Ambulatory Visit: Payer: Self-pay

## 2017-12-18 ENCOUNTER — Encounter
Admission: RE | Disposition: A | Discharge status: Home / Self Care | Payer: Self-pay | Source: Home / Self Care | Attending: Obstetrics and Gynecology

## 2017-12-18 DIAGNOSIS — O99331 Smoking (tobacco) complicating pregnancy, first trimester: Secondary | ICD-10-CM

## 2017-12-18 DIAGNOSIS — Z87891 Personal history of nicotine dependence: Secondary | ICD-10-CM | POA: Diagnosis not present

## 2017-12-18 DIAGNOSIS — Z8759 Personal history of other complications of pregnancy, childbirth and the puerperium: Secondary | ICD-10-CM

## 2017-12-18 DIAGNOSIS — D62 Acute posthemorrhagic anemia: Secondary | ICD-10-CM | POA: Diagnosis not present

## 2017-12-18 DIAGNOSIS — Z3A37 37 weeks gestation of pregnancy: Secondary | ICD-10-CM

## 2017-12-18 DIAGNOSIS — F329 Major depressive disorder, single episode, unspecified: Secondary | ICD-10-CM

## 2017-12-18 DIAGNOSIS — O34211 Maternal care for low transverse scar from previous cesarean delivery: Principal | ICD-10-CM | POA: Diagnosis present

## 2017-12-18 DIAGNOSIS — F32A Depression, unspecified: Secondary | ICD-10-CM

## 2017-12-18 DIAGNOSIS — O9081 Anemia of the puerperium: Secondary | ICD-10-CM | POA: Diagnosis not present

## 2017-12-18 DIAGNOSIS — O09891 Supervision of other high risk pregnancies, first trimester: Secondary | ICD-10-CM

## 2017-12-18 DIAGNOSIS — F419 Anxiety disorder, unspecified: Secondary | ICD-10-CM

## 2017-12-18 DIAGNOSIS — O09213 Supervision of pregnancy with history of pre-term labor, third trimester: Secondary | ICD-10-CM

## 2017-12-18 DIAGNOSIS — O99824 Streptococcus B carrier state complicating childbirth: Secondary | ICD-10-CM

## 2017-12-18 DIAGNOSIS — Z98891 History of uterine scar from previous surgery: Secondary | ICD-10-CM

## 2017-12-18 DIAGNOSIS — Z8751 Personal history of pre-term labor: Secondary | ICD-10-CM

## 2017-12-18 DIAGNOSIS — O0990 Supervision of high risk pregnancy, unspecified, unspecified trimester: Secondary | ICD-10-CM

## 2017-12-18 LAB — URINE DRUG SCREEN, QUALITATIVE (ARMC ONLY)
Amphetamines, Ur Screen: NOT DETECTED
BENZODIAZEPINE, UR SCRN: NOT DETECTED
Barbiturates, Ur Screen: NOT DETECTED
Cannabinoid 50 Ng, Ur ~~LOC~~: NOT DETECTED
Cocaine Metabolite,Ur ~~LOC~~: NOT DETECTED
MDMA (Ecstasy)Ur Screen: NOT DETECTED
Methadone Scn, Ur: NOT DETECTED
Opiate, Ur Screen: NOT DETECTED
Phencyclidine (PCP) Ur S: NOT DETECTED
Tricyclic, Ur Screen: NOT DETECTED

## 2017-12-18 SURGERY — Surgical Case
Anesthesia: Spinal | Site: Abdomen

## 2017-12-18 MED ORDER — LACTATED RINGERS IV SOLN: INTRAVENOUS | Status: DC

## 2017-12-18 MED ORDER — BUPIVACAINE IN DEXTROSE 0.75-8.25 % IT SOLN
INTRATHECAL | Status: DC | PRN
  Administered 2017-12-18: 1.6 mL via INTRATHECAL

## 2017-12-18 MED ORDER — OXYTOCIN 40 UNITS IN LACTATED RINGERS INFUSION - SIMPLE MED
2.5000 [IU]/h | INTRAVENOUS | Status: AC
  Administered 2017-12-18: 2.5 [IU]/h via INTRAVENOUS

## 2017-12-18 MED ORDER — BUPIVACAINE IN DEXTROSE 0.75-8.25 % IT SOLN
INTRATHECAL | Status: AC
  Filled 2017-12-18: qty 2

## 2017-12-18 MED ORDER — NALOXONE HCL 4 MG/10ML IJ SOLN
1.0000 ug/kg/h | INTRAVENOUS | Status: DC | PRN
  Filled 2017-12-18: qty 5

## 2017-12-18 MED ORDER — MEPERIDINE HCL 25 MG/ML IJ SOLN: 6.2500 mg | INTRAMUSCULAR | Status: DC | PRN

## 2017-12-18 MED ORDER — NALBUPHINE HCL 10 MG/ML IJ SOLN
INTRAMUSCULAR | Status: AC
  Administered 2017-12-18: 5 mg via INTRAVENOUS
  Filled 2017-12-18: qty 1

## 2017-12-18 MED ORDER — MORPHINE SULFATE (PF) 0.5 MG/ML IJ SOLN
INTRAMUSCULAR | Status: AC
  Filled 2017-12-18: qty 10

## 2017-12-18 MED ORDER — MORPHINE SULFATE (PF) 0.5 MG/ML IJ SOLN
INTRAMUSCULAR | Status: DC | PRN
  Administered 2017-12-18: .1 mg via INTRATHECAL

## 2017-12-18 MED ORDER — KETOROLAC TROMETHAMINE 30 MG/ML IJ SOLN
30.0000 mg | Freq: Four times a day (QID) | INTRAMUSCULAR | Status: AC | PRN
  Filled 2017-12-18: qty 1

## 2017-12-18 MED ORDER — SIMETHICONE 80 MG PO CHEW
80.0000 mg | CHEWABLE_TABLET | ORAL | Status: DC
  Filled 2017-12-18: qty 1

## 2017-12-18 MED ORDER — MEDROXYPROGESTERONE ACETATE 150 MG/ML IM SUSP: 150.0000 mg | INTRAMUSCULAR | Status: DC | PRN

## 2017-12-18 MED ORDER — NALBUPHINE HCL 10 MG/ML IJ SOLN
5.0000 mg | Freq: Once | INTRAMUSCULAR | Status: AC | PRN
  Administered 2017-12-18: 5 mg via INTRAVENOUS

## 2017-12-18 MED ORDER — PRENATAL MULTIVITAMIN CH
1.0000 | ORAL_TABLET | Freq: Every day | ORAL | Status: DC
  Administered 2017-12-19 – 2017-12-20 (×2): 1 via ORAL
  Filled 2017-12-18 (×2): qty 1

## 2017-12-18 MED ORDER — FENTANYL CITRATE (PF) 100 MCG/2ML IJ SOLN
INTRAMUSCULAR | Status: DC | PRN
  Administered 2017-12-18: 15 ug via INTRATHECAL

## 2017-12-18 MED ORDER — ONDANSETRON HCL 4 MG/2ML IJ SOLN
INTRAMUSCULAR | Status: AC
  Filled 2017-12-18: qty 2

## 2017-12-18 MED ORDER — KETOROLAC TROMETHAMINE 30 MG/ML IJ SOLN
30.0000 mg | Freq: Four times a day (QID) | INTRAMUSCULAR | Status: AC | PRN
  Administered 2017-12-18 – 2017-12-19 (×3): 30 mg via INTRAVENOUS
  Filled 2017-12-18 (×2): qty 1

## 2017-12-18 MED ORDER — DIBUCAINE 1 % RE OINT: 1.0000 "application " | TOPICAL_OINTMENT | RECTAL | Status: DC | PRN

## 2017-12-18 MED ORDER — ONDANSETRON HCL 4 MG/2ML IJ SOLN
INTRAMUSCULAR | Status: AC
  Administered 2017-12-18: 4 mg via INTRAVENOUS
  Filled 2017-12-18: qty 2

## 2017-12-18 MED ORDER — SODIUM CHLORIDE 0.9% FLUSH: 3.0000 mL | INTRAVENOUS | Status: DC | PRN

## 2017-12-18 MED ORDER — NALBUPHINE HCL 10 MG/ML IJ SOLN: 5.0000 mg | INTRAMUSCULAR | Status: DC | PRN

## 2017-12-18 MED ORDER — ONDANSETRON HCL 4 MG/2ML IJ SOLN
4.0000 mg | Freq: Four times a day (QID) | INTRAMUSCULAR | Status: DC
  Administered 2017-12-18: 4 mg via INTRAVENOUS

## 2017-12-18 MED ORDER — NALOXONE HCL 0.4 MG/ML IJ SOLN: 0.4000 mg | INTRAMUSCULAR | Status: DC | PRN

## 2017-12-18 MED ORDER — BUPIVACAINE 0.25 % ON-Q PUMP DUAL CATH 400 ML
400.0000 mL | INJECTION | Status: DC
  Filled 2017-12-18: qty 400

## 2017-12-18 MED ORDER — ZOLPIDEM TARTRATE 5 MG PO TABS: 5.0000 mg | ORAL_TABLET | Freq: Every evening | ORAL | Status: DC | PRN

## 2017-12-18 MED ORDER — NALBUPHINE HCL 10 MG/ML IJ SOLN
5.0000 mg | INTRAMUSCULAR | Status: DC | PRN
  Administered 2017-12-18 – 2017-12-19 (×2): 5 mg via INTRAVENOUS
  Filled 2017-12-18 (×2): qty 1

## 2017-12-18 MED ORDER — WITCH HAZEL-GLYCERIN EX PADS: 1.0000 "application " | MEDICATED_PAD | CUTANEOUS | Status: DC | PRN

## 2017-12-18 MED ORDER — LIDOCAINE HCL (PF) 1 % IJ SOLN
INTRAMUSCULAR | Status: DC | PRN
  Administered 2017-12-18: 3 mL via SUBCUTANEOUS

## 2017-12-18 MED ORDER — OXYTOCIN 40 UNITS IN LACTATED RINGERS INFUSION - SIMPLE MED
INTRAVENOUS | Status: DC | PRN
  Administered 2017-12-18: 700 mL via INTRAVENOUS

## 2017-12-18 MED ORDER — FENTANYL CITRATE (PF) 100 MCG/2ML IJ SOLN
INTRAMUSCULAR | Status: AC
  Filled 2017-12-18: qty 2

## 2017-12-18 MED ORDER — SIMETHICONE 80 MG PO CHEW: 80.0000 mg | CHEWABLE_TABLET | ORAL | Status: DC | PRN

## 2017-12-18 MED ORDER — NALBUPHINE HCL 10 MG/ML IJ SOLN: 5.0000 mg | Freq: Once | INTRAMUSCULAR | Status: AC | PRN

## 2017-12-18 MED ORDER — DIPHENHYDRAMINE HCL 50 MG/ML IJ SOLN: 12.5000 mg | INTRAMUSCULAR | Status: DC | PRN

## 2017-12-18 MED ORDER — LACTATED RINGERS IV SOLN
INTRAVENOUS | Status: DC
  Administered 2017-12-18: 1000 mL via INTRAVENOUS

## 2017-12-18 MED ORDER — FLEET ENEMA 7-19 GM/118ML RE ENEM: 1.0000 | ENEMA | Freq: Every day | RECTAL | Status: DC | PRN

## 2017-12-18 MED ORDER — ACETAMINOPHEN 500 MG PO TABS
1000.0000 mg | ORAL_TABLET | Freq: Four times a day (QID) | ORAL | Status: AC
  Administered 2017-12-18 – 2017-12-19 (×3): 1000 mg via ORAL
  Filled 2017-12-18 (×3): qty 2

## 2017-12-18 MED ORDER — FENTANYL CITRATE (PF) 100 MCG/2ML IJ SOLN: 25.0000 ug | INTRAMUSCULAR | Status: DC | PRN

## 2017-12-18 MED ORDER — BISACODYL 10 MG RE SUPP: 10.0000 mg | Freq: Every day | RECTAL | Status: DC | PRN

## 2017-12-18 MED ORDER — MENTHOL 3 MG MT LOZG
1.0000 | LOZENGE | OROMUCOSAL | Status: DC | PRN
  Filled 2017-12-18: qty 9

## 2017-12-18 MED ORDER — ONDANSETRON HCL 4 MG/2ML IJ SOLN
INTRAMUSCULAR | Status: DC | PRN
  Administered 2017-12-18: 4 mg via INTRAVENOUS

## 2017-12-18 MED ORDER — SIMETHICONE 80 MG PO CHEW
80.0000 mg | CHEWABLE_TABLET | Freq: Three times a day (TID) | ORAL | Status: DC
  Administered 2017-12-18 – 2017-12-21 (×8): 80 mg via ORAL
  Filled 2017-12-18 (×7): qty 1

## 2017-12-18 MED ORDER — OXYTOCIN 40 UNITS IN LACTATED RINGERS INFUSION - SIMPLE MED
INTRAVENOUS | Status: AC
  Filled 2017-12-18: qty 1000

## 2017-12-18 MED ORDER — PHENYLEPHRINE HCL 10 MG/ML IJ SOLN
INTRAMUSCULAR | Status: DC | PRN
  Administered 2017-12-18: 100 ug via INTRAVENOUS

## 2017-12-18 MED ORDER — PROPOFOL 10 MG/ML IV BOLUS
INTRAVENOUS | Status: AC
  Filled 2017-12-18: qty 20

## 2017-12-18 MED ORDER — SODIUM CHLORIDE 0.9 % IV SOLN
INTRAVENOUS | Status: DC | PRN
  Administered 2017-12-18: 50 ug/min via INTRAVENOUS

## 2017-12-18 MED ORDER — DIPHENHYDRAMINE HCL 25 MG PO CAPS: 25.0000 mg | ORAL_CAPSULE | Freq: Four times a day (QID) | ORAL | Status: DC | PRN

## 2017-12-18 MED ORDER — BUPIVACAINE HCL 0.5 % IJ SOLN
INTRAMUSCULAR | Status: DC | PRN
  Administered 2017-12-18: 10 mL

## 2017-12-18 MED ORDER — ONDANSETRON HCL 4 MG/2ML IJ SOLN
4.0000 mg | Freq: Once | INTRAMUSCULAR | Status: AC | PRN
  Administered 2017-12-18: 4 mg via INTRAVENOUS
  Filled 2017-12-18: qty 2

## 2017-12-18 MED ORDER — BUPIVACAINE HCL (PF) 0.5 % IJ SOLN
INTRAMUSCULAR | Status: AC
  Filled 2017-12-18: qty 30

## 2017-12-18 MED ORDER — CEFAZOLIN SODIUM-DEXTROSE 2-4 GM/100ML-% IV SOLN
2.0000 g | INTRAVENOUS | Status: AC
  Administered 2017-12-18: 2 g via INTRAVENOUS
  Filled 2017-12-18: qty 100

## 2017-12-18 MED ORDER — SENNOSIDES-DOCUSATE SODIUM 8.6-50 MG PO TABS
2.0000 | ORAL_TABLET | ORAL | Status: DC
  Administered 2017-12-18 – 2017-12-20 (×3): 2 via ORAL
  Filled 2017-12-18 (×3): qty 2

## 2017-12-18 MED ORDER — COCONUT OIL OIL
1.0000 "application " | TOPICAL_OIL | Status: DC | PRN
  Filled 2017-12-18 (×2): qty 120

## 2017-12-18 MED ORDER — DIPHENHYDRAMINE HCL 25 MG PO CAPS: 25.0000 mg | ORAL_CAPSULE | ORAL | Status: DC | PRN

## 2017-12-18 MED ORDER — SOD CITRATE-CITRIC ACID 500-334 MG/5ML PO SOLN
30.0000 mL | ORAL | Status: AC
  Administered 2017-12-18: 30 mL via ORAL
  Filled 2017-12-18: qty 15

## 2017-12-18 SURGICAL SUPPLY — 30 items
CANISTER SUCT 3000ML PPV (MISCELLANEOUS) ×3 IMPLANT
CATH KIT ON-Q SILVERSOAK 5IN (CATHETERS) ×6 IMPLANT
CHLORAPREP W/TINT 26ML (MISCELLANEOUS) ×6 IMPLANT
CONTAINER UMBILICUP CORD BLD (MISCELLANEOUS) ×1 IMPLANT
DERMABOND ADVANCED (GAUZE/BANDAGES/DRESSINGS) ×2
DERMABOND ADVANCED .7 DNX12 (GAUZE/BANDAGES/DRESSINGS) ×1 IMPLANT
DRSG OPSITE POSTOP 4X10 (GAUZE/BANDAGES/DRESSINGS) ×3 IMPLANT
ELECT CAUTERY BLADE 6.4 (BLADE) ×3 IMPLANT
ELECT REM PT RETURN 9FT ADLT (ELECTROSURGICAL) ×3
ELECTRODE REM PT RTRN 9FT ADLT (ELECTROSURGICAL) ×1 IMPLANT
EXTRACTOR VACUUM KIWI (MISCELLANEOUS) ×3 IMPLANT
GLOVE BIOGEL PI IND STRL 6.5 (GLOVE) ×2 IMPLANT
GLOVE BIOGEL PI INDICATOR 6.5 (GLOVE) ×4
GLOVE SURG SYN 6.5 ES PF (GLOVE) ×6 IMPLANT
GOWN STRL REUS W/ TWL LRG LVL3 (GOWN DISPOSABLE) ×1 IMPLANT
GOWN STRL REUS W/ TWL XL LVL3 (GOWN DISPOSABLE) ×2 IMPLANT
GOWN STRL REUS W/TWL LRG LVL3 (GOWN DISPOSABLE) ×2
GOWN STRL REUS W/TWL XL LVL3 (GOWN DISPOSABLE) ×4
NEEDLE HYPO 22GX1.5 SAFETY (NEEDLE) ×3 IMPLANT
NS IRRIG 1000ML POUR BTL (IV SOLUTION) ×3 IMPLANT
PACK C SECTION AR (MISCELLANEOUS) ×3 IMPLANT
PAD OB MATERNITY 4.3X12.25 (PERSONAL CARE ITEMS) ×3 IMPLANT
PAD PREP 24X41 OB/GYN DISP (PERSONAL CARE ITEMS) ×3 IMPLANT
RTRCTR C-SECT PINK 25CM LRG (MISCELLANEOUS) ×3 IMPLANT
SUT MNCRL AB 4-0 PS2 18 (SUTURE) ×3 IMPLANT
SUT PLAIN 3-0 (SUTURE) ×3 IMPLANT
SUT VIC AB 0 CT1 36 (SUTURE) ×3 IMPLANT
SUT VIC AB 2-0 CT1 36 (SUTURE) ×3 IMPLANT
SYR 30ML LL (SYRINGE) ×6 IMPLANT
UMBILICUP CORD BLD COLLECTION (MISCELLANEOUS) ×3

## 2017-12-18 NOTE — Anesthesia Procedure Notes (Addendum)
Spinal  Patient location during procedure: OR Start time: 12/18/2017 12:20 PM End time: 12/18/2017 12:24 PM Staffing Anesthesiologist: Gunnar Fusi, MD Resident/CRNA: Hedda Slade, CRNA Performed: resident/CRNA  Preanesthetic Checklist Completed: patient identified, site marked, surgical consent, pre-op evaluation, timeout performed, IV checked, risks and benefits discussed and monitors and equipment checked Spinal Block Patient position: sitting Prep: ChloraPrep Patient monitoring: heart rate, continuous pulse ox, blood pressure and cardiac monitor Approach: midline Location: L3-4 Injection technique: single-shot Needle Needle type: Whitacre and Introducer  Needle gauge: 24 G Needle length: 9 cm Assessment Sensory level: T4 Additional Notes Negative paresthesia. Negative blood return. Positive free-flowing CSF. Expiration date of kit checked and confirmed. Patient tolerated procedure well, without complications.

## 2017-12-18 NOTE — Transfer of Care (Signed)
Immediate Anesthesia Transfer of Care Note  Patient: Maria Richmond  Procedure(s) Performed: CESAREAN SECTION (N/A Abdomen)  Patient Location: PACU  Anesthesia Type:Spinal  Level of Consciousness: awake, alert  and oriented  Airway & Oxygen Therapy: Patient Spontanous Breathing  Post-op Assessment: Report given to RN and Post -op Vital signs reviewed and stable  Post vital signs: Reviewed and stable  Last Vitals:  Vitals Value Taken Time  BP 120/90 12/18/2017  2:04 PM  Temp 36.3 C 12/18/2017  2:04 PM  Pulse 83 12/18/2017  2:04 PM  Resp 9 12/18/2017  2:04 PM  SpO2 100 % 12/18/2017  2:04 PM    Last Pain:  Vitals:   12/18/17 1045  TempSrc: Oral  PainSc: 0-No pain         Complications: No apparent anesthesia complications

## 2017-12-18 NOTE — Progress Notes (Signed)
Ambulated in hall with CNA, no issues,.

## 2017-12-18 NOTE — Anesthesia Post-op Follow-up Note (Signed)
Anesthesia QCDR form completed.        

## 2017-12-18 NOTE — Op Note (Signed)
Cesarean Section Procedure Note 12/18/17  Pre-operative Diagnosis: 1. Hx of prior classical cesarean section 2. [redacted] weeks gestation Post-operative Diagnosis: same, delivered. Thin filmy adhesions around the bladder Procedure: Repeat Low Transverse Cesarean Section Surgeon: Adelene Idlerhristanna Schuman MD   Assistant(s): Maryruth EveJaci Schmid CNM- No other skilled surgical assistant available. Anesthesia: Spinal Estimated Blood Loss: 700cc Complications: None; patient tolerated the procedure well.   Disposition: PACU - hemodynamically stable. Condition: stable   Findings: A female infant in the cephalic presentation. Amniotic fluid - clear   Birth weight: 6 lbs 0oz Apgars of 9 and 9.  Intact placenta with a three-vessel cord. Grossly normal uterus, tubes and ovaries bilaterally. Thin filmy  intraabdominal adhesions above the bladder were noted.   Procedure Details    The patient was taken to operating room, identified as the correct patient and the procedure verified as C-Section Delivery. A time out was held and the above information confirmed. After induction of anesthesia, the patient was draped and prepped in the usual sterile manner. A Pfannenstiel incision was made and carried down through the subcutaneous tissue to the fascia. Fascial incision was made and extended transversely with the Mayo scissors. The fascia was separated from the underlying rectus tissue superiorly and inferiorly. The peritoneum was identified and entered bluntly. Peritoneal incision was extended longitudinally. A bladder flap was created using the metzenbaum scissors. The Alexis retractor was placed.  A low transverse hysterotomy was made. The fetus was delivered atraumatically using the vacuum inflated in the green zone. The umbilical cord was clamped x2 and cut and the infant was handed to the awaiting pediatricians. The placenta was removed intact and appeared normal with a 3-vessel cord. The uterus was cleared of all clot and  debris. The alexis retractor was removed. The hysterotomy was closed with running sutures of 0 Vicryl suture. A second imbricating layer was placed with the same suture. Excellent hemostasis was observed.  The On Q Pain pump System was then placed.  Trocars were placed through the abdominal wall into the subfascial space and these were used to thread the silver soaker cathaters into place.The rectus muscles were inspected and were hemostatic. The rectus fascia was then reapproximated with running sutures of 0-vicryl, with careful placement not to incorporate the cathaters. Subcutaneous tissues are then irrigated with saline and hemostasis was observed. The subcutaneous fat was approximated with 3-0 plain and a running stitch.  Skin was then closed with 4-0 monocryl suture in a subcuticular fashion followed by skin adhesive. The cathaters are flushed each with 5 mL of Bupivicaine and stabilized into place with dressing. Instrument, sponge, and needle counts were correct prior to the abdominal closure and at the conclusion of the case.  The patient tolerated the procedure well and was transferred to the recovery room in stable condition.   Natale Milchhristanna R Schuman MD [facility], [facility] 12/18/17 2:00 PM

## 2017-12-18 NOTE — Discharge Instructions (Signed)
Breastfeeding °Choosing to breastfeed is one of the best decisions you can make for yourself and your baby. A change in hormones during pregnancy causes your breasts to make breast milk in your milk-producing glands. Hormones prevent breast milk from being released before your baby is born. They also prompt milk flow after birth. Once breastfeeding has begun, thoughts of your baby, as well as his or her sucking or crying, can stimulate the release of milk from your milk-producing glands. °Benefits of breastfeeding °Research shows that breastfeeding offers many health benefits for infants and mothers. It also offers a cost-free and convenient way to feed your baby. °For your baby °· Your first milk (colostrum) helps your baby's digestive system to function better. °· Special cells in your milk (antibodies) help your baby to fight off infections. °· Breastfed babies are less likely to develop asthma, allergies, obesity, or type 2 diabetes. They are also at lower risk for sudden infant death syndrome (SIDS). °· Nutrients in breast milk are better able to meet your baby’s needs compared to infant formula. °· Breast milk improves your baby's brain development. °For you °· Breastfeeding helps to create a very special bond between you and your baby. °· Breastfeeding is convenient. Breast milk costs nothing and is always available at the correct temperature. °· Breastfeeding helps to burn calories. It helps you to lose the weight that you gained during pregnancy. °· Breastfeeding makes your uterus return faster to its size before pregnancy. It also slows bleeding (lochia) after you give birth. °· Breastfeeding helps to lower your risk of developing type 2 diabetes, osteoporosis, rheumatoid arthritis, cardiovascular disease, and breast, ovarian, uterine, and endometrial cancer later in life. °Breastfeeding basics °Starting breastfeeding °· Find a comfortable place to sit or lie down, with your neck and back  well-supported. °· Place a pillow or a rolled-up blanket under your baby to bring him or her to the level of your breast (if you are seated). Nursing pillows are specially designed to help support your arms and your baby while you breastfeed. °· Make sure that your baby's tummy (abdomen) is facing your abdomen. °· Gently massage your breast. With your fingertips, massage from the outer edges of your breast inward toward the nipple. This encourages milk flow. If your milk flows slowly, you may need to continue this action during the feeding. °· Support your breast with 4 fingers underneath and your thumb above your nipple (make the letter "C" with your hand). Make sure your fingers are well away from your nipple and your baby’s mouth. °· Stroke your baby's lips gently with your finger or nipple. °· When your baby's mouth is open wide enough, quickly bring your baby to your breast, placing your entire nipple and as much of the areola as possible into your baby's mouth. The areola is the colored area around your nipple. °? More areola should be visible above your baby's upper lip than below the lower lip. °? Your baby's lips should be opened and extended outward (flanged) to ensure an adequate, comfortable latch. °? Your baby's tongue should be between his or her lower gum and your breast. °· Make sure that your baby's mouth is correctly positioned around your nipple (latched). Your baby's lips should create a seal on your breast and be turned out (everted). °· It is common for your baby to suck about 2-3 minutes in order to start the flow of breast milk. °Latching °Teaching your baby how to latch onto your breast properly is very   important. An improper latch can cause nipple pain, decreased milk supply, and poor weight gain in your baby. Also, if your baby is not latched onto your nipple properly, he or she may swallow some air during feeding. This can make your baby fussy. Burping your baby when you switch breasts  during the feeding can help to get rid of the air. However, teaching your baby to latch on properly is still the best way to prevent fussiness from swallowing air while breastfeeding. °Signs that your baby has successfully latched onto your nipple °· Silent tugging or silent sucking, without causing you pain. Infant's lips should be extended outward (flanged). °· Swallowing heard between every 3-4 sucks once your milk has started to flow (after your let-down milk reflex occurs). °· Muscle movement above and in front of his or her ears while sucking. ° °Signs that your baby has not successfully latched onto your nipple °· Sucking sounds or smacking sounds from your baby while breastfeeding. °· Nipple pain. ° °If you think your baby has not latched on correctly, slip your finger into the corner of your baby’s mouth to break the suction and place it between your baby's gums. Attempt to start breastfeeding again. °Signs of successful breastfeeding °Signs from your baby °· Your baby will gradually decrease the number of sucks or will completely stop sucking. °· Your baby will fall asleep. °· Your baby's body will relax. °· Your baby will retain a small amount of milk in his or her mouth. °· Your baby will let go of your breast by himself or herself. ° °Signs from you °· Breasts that have increased in firmness, weight, and size 1-3 hours after feeding. °· Breasts that are softer immediately after breastfeeding. °· Increased milk volume, as well as a change in milk consistency and color by the fifth day of breastfeeding. °· Nipples that are not sore, cracked, or bleeding. ° °Signs that your baby is getting enough milk °· Wetting at least 1-2 diapers during the first 24 hours after birth. °· Wetting at least 5-6 diapers every 24 hours for the first week after birth. The urine should be clear or pale yellow by the age of 5 days. °· Wetting 6-8 diapers every 24 hours as your baby continues to grow and develop. °· At least 3  stools in a 24-hour period by the age of 5 days. The stool should be soft and yellow. °· At least 3 stools in a 24-hour period by the age of 7 days. The stool should be seedy and yellow. °· No loss of weight greater than 10% of birth weight during the first 3 days of life. °· Average weight gain of 4-7 oz (113-198 g) per week after the age of 4 days. °· Consistent daily weight gain by the age of 5 days, without weight loss after the age of 2 weeks. °After a feeding, your baby may spit up a small amount of milk. This is normal. °Breastfeeding frequency and duration °Frequent feeding will help you make more milk and can prevent sore nipples and extremely full breasts (breast engorgement). Breastfeed when you feel the need to reduce the fullness of your breasts or when your baby shows signs of hunger. This is called "breastfeeding on demand." Signs that your baby is hungry include: °· Increased alertness, activity, or restlessness. °· Movement of the head from side to side. °· Opening of the mouth when the corner of the mouth or cheek is stroked (rooting). °· Increased sucking sounds,   smacking lips, cooing, sighing, or squeaking. °· Hand-to-mouth movements and sucking on fingers or hands. °· Fussing or crying. ° °Avoid introducing a pacifier to your baby in the first 4-6 weeks after your baby is born. After this time, you may choose to use a pacifier. Research has shown that pacifier use during the first year of a baby's life decreases the risk of sudden infant death syndrome (SIDS). °Allow your baby to feed on each breast as long as he or she wants. When your baby unlatches or falls asleep while feeding from the first breast, offer the second breast. Because newborns are often sleepy in the first few weeks of life, you may need to awaken your baby to get him or her to feed. °Breastfeeding times will vary from baby to baby. However, the following rules can serve as a guide to help you make sure that your baby is  properly fed: °· Newborns (babies 4 weeks of age or younger) may breastfeed every 1-3 hours. °· Newborns should not go without breastfeeding for longer than 3 hours during the day or 5 hours during the night. °· You should breastfeed your baby a minimum of 8 times in a 24-hour period. ° °Breast milk pumping °Pumping and storing breast milk allows you to make sure that your baby is exclusively fed your breast milk, even at times when you are unable to breastfeed. This is especially important if you go back to work while you are still breastfeeding, or if you are not able to be present during feedings. Your lactation consultant can help you find a method of pumping that works best for you and give you guidelines about how long it is safe to store breast milk. °Caring for your breasts while you breastfeed °Nipples can become dry, cracked, and sore while breastfeeding. The following recommendations can help keep your breasts moisturized and healthy: °· Avoid using soap on your nipples. °· Wear a supportive bra designed especially for nursing. Avoid wearing underwire-style bras or extremely tight bras (sports bras). °· Air-dry your nipples for 3-4 minutes after each feeding. °· Use only cotton bra pads to absorb leaked breast milk. Leaking of breast milk between feedings is normal. °· Use lanolin on your nipples after breastfeeding. Lanolin helps to maintain your skin's normal moisture barrier. Pure lanolin is not harmful (not toxic) to your baby. You may also hand express a few drops of breast milk and gently massage that milk into your nipples and allow the milk to air-dry. ° °In the first few weeks after giving birth, some women experience breast engorgement. Engorgement can make your breasts feel heavy, warm, and tender to the touch. Engorgement peaks within 3-5 days after you give birth. The following recommendations can help to ease engorgement: °· Completely empty your breasts while breastfeeding or pumping. You  may want to start by applying warm, moist heat (in the shower or with warm, water-soaked hand towels) just before feeding or pumping. This increases circulation and helps the milk flow. If your baby does not completely empty your breasts while breastfeeding, pump any extra milk after he or she is finished. °· Apply ice packs to your breasts immediately after breastfeeding or pumping, unless this is too uncomfortable for you. To do this: °? Put ice in a plastic bag. °? Place a towel between your skin and the bag. °? Leave the ice on for 20 minutes, 2-3 times a day. °· Make sure that your baby is latched on and positioned properly while   breastfeeding. ° °If engorgement persists after 48 hours of following these recommendations, contact your health care provider or a lactation consultant. °Overall health care recommendations while breastfeeding °· Eat 3 healthy meals and 3 snacks every day. Well-nourished mothers who are breastfeeding need an additional 450-500 calories a day. You can meet this requirement by increasing the amount of a balanced diet that you eat. °· Drink enough water to keep your urine pale yellow or clear. °· Rest often, relax, and continue to take your prenatal vitamins to prevent fatigue, stress, and low vitamin and mineral levels in your body (nutrient deficiencies). °· Do not use any products that contain nicotine or tobacco, such as cigarettes and e-cigarettes. Your baby may be harmed by chemicals from cigarettes that pass into breast milk and exposure to secondhand smoke. If you need help quitting, ask your health care provider. °· Avoid alcohol. °· Do not use illegal drugs or marijuana. °· Talk with your health care provider before taking any medicines. These include over-the-counter and prescription medicines as well as vitamins and herbal supplements. Some medicines that may be harmful to your baby can pass through breast milk. °· It is possible to become pregnant while breastfeeding. If  birth control is desired, ask your health care provider about options that will be safe while breastfeeding your baby. °Where to find more information: °[employer]: www.llli.org °Contact a health care provider if: °· You feel like you want to stop breastfeeding or have become frustrated with breastfeeding. °· Your nipples are cracked or bleeding. °· Your breasts are red, tender, or warm. °· You have: °? Painful breasts or nipples. °? A swollen area on either breast. °? A fever or chills. °? Nausea or vomiting. °? Drainage other than breast milk from your nipples. °· Your breasts do not become full before feedings by the fifth day after you give birth. °· You feel sad and depressed. °· Your baby is: °? Too sleepy to eat well. °? Having trouble sleeping. °? More than 1 week old and wetting fewer than 6 diapers in a 24-hour period. °? Not gaining weight by 5 days of age. °· Your baby has fewer than 3 stools in a 24-hour period. °· Your baby's skin or the white parts of his or her eyes become yellow. °Get help right away if: °· Your baby is overly tired (lethargic) and does not want to wake up and feed. °· Your baby develops an unexplained fever. °Summary °· Breastfeeding offers many health benefits for infant and mothers. °· Try to breastfeed your infant when he or she shows early signs of hunger. °· Gently tickle or stroke your baby's lips with your finger or nipple to allow the baby to open his or her mouth. Bring the baby to your breast. Make sure that much of the areola is in your baby's mouth. Offer one side and burp the baby before you offer the other side. °· Talk with your health care provider or lactation consultant if you have questions or you face problems as you breastfeed. °This information is not intended to replace advice given to you by your health care provider. Make sure you discuss any questions you have with your health care provider. °Document Released: 12/23/2004 Document  Revised: 01/25/2016 Document Reviewed: 01/25/2016 °[employer] Interactive Patient Education © 2018 [employer] °Breast Pumping Tips °If you are breastfeeding, there may be times when you cannot feed your baby directly. Returning to work or going on a trip are common examples.   Pumping allows you to store breast milk and feed it to your baby later. °You may not get much milk when you first start to pump. Your breasts should start to make more after a few days. If you pump at the times you usually feed your baby, you may be able to keep making enough milk to feed your baby without also using formula. The more often you pump, the more milk you will produce. °When should I pump? °· You can begin to pump soon after delivery. However, some experts recommend waiting about 4 weeks before giving your infant a bottle to make sure breastfeeding is going well. °· If you plan to return to work, begin pumping a few weeks before. This will help you develop techniques that work best for you. It also lets you build up a supply of breast milk. °· When you are with your infant, feed on demand and pump after each feeding. °· When you are away from your infant for several hours, pump for about 15 minutes every 2-3 hours. Pump both breasts at the same time if you can. °· If your infant has a formula feeding, make sure to pump around the same time. °· If you drink any alcohol, wait 2 hours before pumping. °How do I prepare to pump? °Your let-down reflex is the natural reaction to stimulation that makes your breast milk flow. It is easier to stimulate this reflex when you are relaxed. Find relaxation techniques that work for you. If you have difficulty with your let-down reflex, try these methods: °· Smell one of your infant's blankets or an item of clothing. °· Look at a picture or video of your infant. °· Sit in a quiet, private space. °· Massage the breast you plan to pump. °· Place soothing warmth on the breast. °· Play relaxing  music. ° °What are some general breast pumping tips? °· Wash your hands before you pump. You do not need to wash your nipples or breasts. °· There are three ways to pump. °? You can use your hand to massage and compress your breast. °? You can use a handheld manual pump. °? You can use an electric pump. °· Make sure the suction cup (flange) on the breast pump is the right size. Place the flange directly over the nipple. If it is the wrong size or placed the wrong way, it may be painful and cause nipple damage. °· If pumping is uncomfortable, apply a small amount of purified or modified lanolin to your nipple and areola. °· If you are using an electric pump, adjust the speed and suction power to be more comfortable. °· If pumping is painful or if you are not getting very much milk, you may need a different type of pump. A lactation consultant can help you determine what type of pump to use. °· Keep a full water bottle near you at all times. Drinking lots of fluid helps you make more milk. °· You can store your milk to use later. Pumped breast milk can be stored in a sealable, sterile container or plastic bag. Label all stored breast milk with the date you pumped it. °? Milk can stay out at room temperature for up to 8 hours. °? You can store your milk in the refrigerator for up to 8 days. °? You can store your milk in the freezer for 3 months. Thaw frozen milk using warm water. Do not put it in the microwave. °· Do not smoke. Smoking can lower   your milk supply and harm your infant. If you need help quitting, ask your health care provider to recommend a program. °When should I call my health care provider or a lactation consultant? °· You are having trouble pumping. °· You are concerned that you are not making enough milk. °· You have nipple pain, soreness, or redness. °· You want to use birth control. Birth control pills may lower your milk supply. Talk to your health care provider about your options. °This  information is not intended to replace advice given to you by your health care provider. Make sure you discuss any questions you have with your health care provider. °Document Released: 06/12/2009 Document Revised: 06/06/2015 Document Reviewed: 10/15/2012 °[employer] Interactive Patient Education © 2017 [employer] °Cesarean Delivery, Care After °Refer to this sheet in the next few weeks. These instructions provide you with information about caring for yourself after your procedure. Your health care provider may also give you more specific instructions. Your treatment has been planned according to current medical practices, but problems sometimes occur. Call your health care provider if you have any problems or questions after your procedure. °What can I expect after the procedure? °After the procedure, it is common to have: °· A small amount of blood or clear fluid coming from the incision. °· Some redness, swelling, and pain in your incision area. °· Some abdominal pain and soreness. °· Vaginal bleeding (lochia). °· Pelvic cramps. °· Fatigue. ° °Follow these instructions at home: °Incision care ° °· Follow instructions from your health care provider about how to take care of your incision. Make sure you: °? Wash your hands with soap and water before you change your bandage (dressing). If soap and water are not available, use hand sanitizer. °? If you have a dressing, change it as told by your health care provider. °? Leave stitches (sutures), skin staples, skin glue, or adhesive strips in place. These skin closures may need to stay in place for 2 weeks or longer. If adhesive strip edges start to loosen and curl up, you may trim the loose edges. Do not remove adhesive strips completely unless your health care provider tells you to do that. °· Check your incision area every day for signs of infection. Check for: °? More redness, swelling, or pain. °? More fluid or blood. °? Warmth. °? Pus or a bad smell. °· When you  cough or sneeze, hug a pillow. This helps with pain and decreases the chance of your incision opening up (dehiscing). Do this until your incision heals. °Medicines °· Take over-the-counter and prescription medicines only as told by your health care provider. °· If you were prescribed an antibiotic medicine, take it as told by your health care provider. Do not stop taking the antibiotic until it is finished. °Driving °· Do not drive or operate heavy machinery while taking prescription pain medicine. °Lifestyle °· Do not drink alcohol. This is especially important if you are breastfeeding or taking pain medicine. °· Do not use tobacco products, including cigarettes, chewing tobacco, or e-cigarettes. If you need help quitting, ask your health care provider. Tobacco can delay wound healing. °Eating and drinking °· Drink at least 8 eight-ounce glasses of water every day unless told not to by your health care provider. If you breastfeed, you may need to drink more water than this. °· Eat high-fiber foods every day. These foods may help prevent or relieve constipation. High-fiber foods include: °? Whole grain cereals and breads. °? Brown rice. °? Beans. °?   Fresh fruits and vegetables. °Activity °· Return to your normal activities as told by your health care provider. Ask your health care provider what activities are safe for you. °· Rest as much as possible. Try to rest or take a nap while your baby is sleeping. °· Do not lift anything that is heavier than your baby or 10 lb (4.5 kg) as told by your health care provider. °· Ask your health care provider when you can engage in sexual activity. This may depend on your: °? Risk of infection. °? Healing rate. °? Comfort and desire to engage in sexual activity. °Bathing °· Do not take baths, swim, or use a hot tub until your health care provider approves. Ask your health care provider if you can take showers. You may only be allowed to take sponge baths until your incision  heals. °General instructions °· Do not use tampons or douches until your health care provider approves. °· Wear: °? Loose, comfortable clothing. °? A supportive and well-fitting bra. °· Watch for any blood clots that may pass from your vagina. These may look like clumps of dark red, brown, or black discharge. °· Keep your perineum clean and dry as told by your health care provider. °· Wipe from front to back when you use the toilet. °· If possible, have someone help you care for your baby and help with household activities for a few days after you leave the hospital. °· Keep all follow-up visits for you and your baby as told by your health care provider. This is important. °Contact a health care provider if: °· You have: °? Bad-smelling vaginal discharge. °? Difficulty urinating. °? Pain when urinating. °? A sudden increase or decrease in the frequency of your bowel movements. °? More redness, swelling, or pain around your incision. °? More fluid or blood coming from your incision. °? Pus or a bad smell coming from your incision. °? A fever. °? A rash. °? Little or no interest in activities you used to enjoy. °? Questions about caring for yourself or your baby. °? Nausea. °· Your incision feels warm to the touch. °· Your breasts turn red or become painful or hard. °· You feel unusually sad or worried. °· You vomit. °· You pass large blood clots from your vagina. If you pass a blood clot, save it to show to your health care provider. Do not flush blood clots down the toilet without showing your health care provider. °· You urinate more than usual. °· You are dizzy or light-headed. °· You have not breastfed and have not had a menstrual period for 12 weeks after delivery. °· You stopped breastfeeding and have not had a menstrual period for 12 weeks after stopping breastfeeding. °Get help right away if: °· You have: °? Pain that does not go away or get better with medicine. °? Chest pain. °? Difficulty  breathing. °? Blurred vision or spots in your vision. °? Thoughts about hurting yourself or your baby. °? New pain in your abdomen or in one of your legs. °? A severe headache. °· You faint. °· You bleed from your vagina so much that you fill two sanitary pads in one hour. °This information is not intended to replace advice given to you by your health care provider. Make sure you discuss any questions you have with your health care provider. °Document Released: 09/14/2001 Document Revised: 01/26/2016 Document Reviewed: 11/27/2014 °[employer] Interactive Patient Education © 2018 [employer] ° °

## 2017-12-18 NOTE — Anesthesia Preprocedure Evaluation (Signed)
Anesthesia Evaluation  Patient identified by MRN, date of birth, ID band Patient awake    Reviewed: Allergy & Precautions, NPO status , Patient's Chart, lab work & pertinent test results  History of Anesthesia Complications Negative for: history of anesthetic complications  Airway Mallampati: III       Dental   Pulmonary neg sleep apnea, neg COPD, former smoker,           Cardiovascular (-) hypertension(-) Past MI and (-) CHF (-) dysrhythmias (-) Valvular Problems/Murmurs     Neuro/Psych neg Seizures Anxiety Depression    GI/Hepatic Neg liver ROS, GERD  Medicated,  Endo/Other  neg diabetes  Renal/GU negative Renal ROS     Musculoskeletal   Abdominal   Peds  Hematology   Anesthesia Other Findings   Reproductive/Obstetrics (+) Pregnancy                             Anesthesia Physical Anesthesia Plan  ASA: II  Anesthesia Plan: Spinal   Post-op Pain Management:    Induction:   PONV Risk Score and Plan:   Airway Management Planned:   Additional Equipment:   Intra-op Plan:   Post-operative Plan:   Informed Consent: I have reviewed the patients History and Physical, chart, labs and discussed the procedure including the risks, benefits and alternatives for the proposed anesthesia with the patient or authorized representative who has indicated his/her understanding and acceptance.     Plan Discussed with:   Anesthesia Plan Comments:         Anesthesia Quick Evaluation

## 2017-12-19 LAB — CBC
HCT: 30.8 % — ABNORMAL LOW (ref 36.0–46.0)
Hemoglobin: 9.7 g/dL — ABNORMAL LOW (ref 12.0–15.0)
MCH: 27.9 pg (ref 26.0–34.0)
MCHC: 31.5 g/dL (ref 30.0–36.0)
MCV: 88.5 fL (ref 80.0–100.0)
Platelets: 147 10*3/uL — ABNORMAL LOW (ref 150–400)
RBC: 3.48 MIL/uL — ABNORMAL LOW (ref 3.87–5.11)
RDW: 12.9 % (ref 11.5–15.5)
WBC: 8.2 10*3/uL (ref 4.0–10.5)
nRBC: 0 % (ref 0.0–0.2)

## 2017-12-19 MED ORDER — IBUPROFEN 600 MG PO TABS
600.0000 mg | ORAL_TABLET | Freq: Four times a day (QID) | ORAL | Status: DC | PRN
  Administered 2017-12-20 – 2017-12-21 (×2): 600 mg via ORAL
  Filled 2017-12-19 (×3): qty 1

## 2017-12-19 MED ORDER — OXYCODONE-ACETAMINOPHEN 5-325 MG PO TABS
1.0000 | ORAL_TABLET | ORAL | Status: DC | PRN
  Administered 2017-12-19 – 2017-12-20 (×3): 2 via ORAL
  Administered 2017-12-21: 1 via ORAL
  Filled 2017-12-19: qty 2
  Filled 2017-12-19: qty 1
  Filled 2017-12-19: qty 2
  Filled 2017-12-19 (×2): qty 1

## 2017-12-19 NOTE — Anesthesia Post-op Follow-up Note (Signed)
  Anesthesia Pain Follow-up Note  Patient: Maria Richmond  Day #: 1  Date of Follow-up: 12/19/2017 Time: 9:01 AM  Last Vitals:  Vitals:   12/19/17 0500 12/19/17 0746  BP: 121/71 118/75  Pulse:  88  Resp:  18  Temp:  36.7 C  SpO2: 94% 99%    Level of Consciousness: alert  Pain: mild   Side Effects:None  Catheter Site Exam:clean     Plan: D/C from anesthesia care at surgeon's request  Cleda MccreedyJoseph K Nikola Blackston

## 2017-12-19 NOTE — Lactation Note (Addendum)
This note was copied from a baby's chart. Lactation Consultation Note  Patient Name: Maria Richmond XBJYN'WToday's Date: 12/19/2017 Reason for consult: Initial assessment;1st time breastfeeding;Early term 37-38.6wks;Other (Comment)(Slightly tight frenulum - (+) coombs) Mom reports not being able to wake baby for breast feed since 0730 am feeding.  This is not mom's first baby, but is her first time to breast feed.  Demonstrated waking techniques.  Hand massaged and expressed colostrum to entice her to latch.  Baby has slightly tight frenulum, but can easily extend tongue when lips stroked with mom's nipple.  Mom's right nipple is slightly flat, but everts with compression.  Mom was not very involved.  Encouraged mom to sandwich breast and put her in cross cradle hold.  After a few attempts, she latched deeply enough to begin rhythmic sucking with occasional swallows.  Demonstrated how to hold baby closer to prevent her from slipping off to tip of nipple.  Reviewed supply and demand, normal course of lactation and routine newborn feeding patterns.  Lactation name and number written on white board and encouraged to call with any questions, concerns or assistance.    Maternal Data Formula Feeding for Exclusion: No Has patient been taught Hand Expression?: Yes Does the patient have breastfeeding experience prior to this delivery?: No(Not first baby, but first time to breast feed)  Feeding Feeding Type: Breast Fed  LATCH Score Latch: Repeated attempts needed to sustain latch, nipple held in mouth throughout feeding, stimulation needed to elicit sucking reflex.  Audible Swallowing: A few with stimulation  Type of Nipple: Flat(Everts with stimulation)  Comfort (Breast/Nipple): Soft / non-tender  Hold (Positioning): Assistance needed to correctly position infant at breast and maintain latch.  LATCH Score: 6  Interventions Interventions: Breast feeding basics reviewed;Assisted with latch;Breast  massage;Hand express;Reverse pressure;Breast compression;Adjust position;Support pillows;Position options  Lactation Tools Discussed/Used WIC Program: Yes   Consult Status Consult Status: Follow-up Date: 12/19/17 Follow-up type: Call as needed    Louis MeckelWilliams, Diesel Lina Kay 12/19/2017, 12:44 PM

## 2017-12-19 NOTE — Lactation Note (Signed)
This note was copied from a baby's chart. Lactation Consultation Note  Patient Name: Maria Richmond ZOXWR'UToday's Date: 12/19/2017 Reason for consult: Initial assessment;1st time breastfeeding;Early term 37-38.6wks;Other (Comment)(Slightly tight frenulum - (+) coombs)   Maternal Data Formula Feeding for Exclusion: No Has patient been taught Hand Expression?: Yes Does the patient have breastfeeding experience prior to this delivery?: No(Not first baby, but first time to breast feed)  Feeding Feeding Type: Breast Fed  LATCH Score Latch: Repeated attempts needed to sustain latch, nipple held in mouth throughout feeding, stimulation needed to elicit sucking reflex.  Audible Swallowing: A few with stimulation  Type of Nipple: Flat(Everts with stimulation)  Comfort (Breast/Nipple): Soft / non-tender  Hold (Positioning): Assistance needed to correctly position infant at breast and maintain latch.  LATCH Score: 6  Interventions Interventions: Breast feeding basics reviewed;Assisted with latch;Breast massage;Hand express;Reverse pressure;Breast compression;Adjust position;Support pillows;Position options  Lactation Tools Discussed/Used WIC Program: Yes   Consult Status Consult Status: Follow-up Date: 12/19/17 Follow-up type: Call as needed    Louis MeckelWilliams, [name] 12/19/2017, 2:50 PM

## 2017-12-19 NOTE — Progress Notes (Signed)
Subjective:  Doing well no concerns.  Lochia < menses. Tolerating po.  Good pain control  Objective:  Vital signs in last 24 hours: Temp:  [97.3 F (36.3 C)-98.2 F (36.8 C)] 98 F (36.7 C) (12/14 0746) Pulse Rate:  [77-107] 88 (12/14 0746) Resp:  [9-18] 18 (12/14 0746) BP: (113-147)/(71-94) 118/75 (12/14 0746) SpO2:  [93 %-100 %] 99 % (12/14 0746) Weight:  [86.2 kg] 86.2 kg (12/13 1045)    Intake/Output      12/13 0701 - 12/14 0700 12/14 0701 - 12/15 0700   I.V. (mL/kg) 1000 (11.6)    Total Intake(mL/kg) 1000 (11.6)    Urine (mL/kg/hr) 1250    Blood 748    Total Output 1998    Net -998           General: NAD Pulmonary: no increased work of breathing Abdomen: non-distended, non-tender, fundus firm at level of umbilicus Incision: D/C/I Extremities: no edema, no erythema, no tenderness  Results for orders placed or performed during the hospital encounter of 12/18/17 (from the past 72 hour(s))  Urine Drug Screen, Qualitative ([facility] only)     Status: None   Collection Time: 12/18/17  4:00 PM  Result Value Ref Range   Tricyclic, Ur Screen NONE DETECTED NONE DETECTED   Amphetamines, Ur Screen NONE DETECTED NONE DETECTED   MDMA (Ecstasy)Ur Screen NONE DETECTED NONE DETECTED   Cocaine Metabolite,Ur [place] NONE DETECTED NONE DETECTED   Opiate, Ur Screen NONE DETECTED NONE DETECTED   Phencyclidine (PCP) Ur S NONE DETECTED NONE DETECTED   Cannabinoid 50 Ng, Ur [place] NONE DETECTED NONE DETECTED   Barbiturates, Ur Screen NONE DETECTED NONE DETECTED   Benzodiazepine, Ur Scrn NONE DETECTED NONE DETECTED   Methadone Scn, Ur NONE DETECTED NONE DETECTED    Comment: (NOTE) Tricyclics + metabolites, urine    Cutoff 1000 ng/mL Amphetamines + metabolites, urine  Cutoff 1000 ng/mL MDMA (Ecstasy), urine              Cutoff 500 ng/mL Cocaine Metabolite, urine          Cutoff 300 ng/mL Opiate + metabolites, urine        Cutoff 300 ng/mL Phencyclidine (PCP), urine         Cutoff 25  ng/mL Cannabinoid, urine                 Cutoff 50 ng/mL Barbiturates + metabolites, urine  Cutoff 200 ng/mL Benzodiazepine, urine              Cutoff 200 ng/mL Methadone, urine                   Cutoff 300 ng/mL The urine drug screen provides only a preliminary, unconfirmed analytical test result and should not be used for non-medical purposes. Clinical consideration and professional judgment should be applied to any positive drug screen result due to possible interfering substances. A more specific alternate chemical method must be used in order to obtain a confirmed analytical result. Gas chromatography / mass spectrometry (GC/MS) is the preferred confirmat ory method. Performed at [facility], [address] [place], [place] [address]   CBC     Status: Abnormal   Collection Time: 12/19/17  6:33 AM  Result Value Ref Range   WBC 8.2 4.0 - 10.5 K/uL   RBC 3.48 (L) 3.87 - 5.11 MIL/uL   Hemoglobin 9.7 (L) 12.0 - 15.0 g/dL   HCT 30.8 (L) 36.0 - 46.0 %   MCV 88.5  80.0 - 100.0 fL   MCH 27.9 26.0 - 34.0 pg   MCHC 31.5 30.0 - 36.0 g/dL   RDW 12.9 11.5 - 15.5 %   Platelets 147 (L) 150 - 400 K/uL   nRBC 0.0 0.0 - 0.2 %    Comment: Performed at [facility], [address] [place], [place] [address]    There is no immunization history for the selected administration types on file for this patient.  Assessment:   22 y.o. [id number] postoperativeday # 1 RLTCS  G4 Problems (from 05/27/17 to present)    Problem Noted Resolved   Supervision of high risk pregnancy, antepartum 07/01/2017 by Will Bonnet, MD No   Pregnancy complicated by tobacco use in first trimester 06/04/2017 by Rexene Agent, CNM No   Short interval between pregnancies affecting pregnancy in first trimester, antepartum 06/04/2017 by Rexene Agent, CNM No   Anxiety and depression 06/04/2017 by Rexene Agent, CNM No   High risk pregnancy due to history of preterm labor 06/03/2017 by  Rexene Agent, CNM No   Overview Addendum 08/20/2017 12:10 PM by Homero Fellers, MD    [facility] Prenatal Labs  Dating  7 wk [place] Blood type: O/Positive/-- (05/29 1422)   Genetic Screen  NIPS: normal Antibody:Negative (05/29 1422)  Anatomic [place] incomplete Rubella: <0.90 (05/29 1422) Nonimmune Varicella:  NonImmune  GTT Early:                Third trimester:  RPR: Non Reactive (05/29 1422)   Rhogam Not needed HBsAg: Negative (05/29 1422)   TDaP vaccine                       Flu Shot: HIV: Non Reactive (05/29 1422)   Baby Food                                GBS:   Contraception  Pap: 2019 NIL  CBB     CS/VBAC  CESAREAN _0  weeks   Support Person  [place] (FOB)             History of placenta abruption 06/25/2016 by Malachy Mood, MD No   History of classical cesarean section 01/31/2016 by Gatha Mayer, MD No   Overview Signed 01/31/2016  5:09 PM by Gatha Mayer, MD    22 yo G1P0 admitted at 80w5dafter PPROM.  She was started on latency antibiotics.  Received ICN consult.  She had previously received BMZ and did not received rescue course on admission.  On HD#4 she began having vaginal bleeding.  She was placed on fetal heart rate monitor to ensure fetal well-being.  She continued to have bleeding and new onset abdominal pain.  Placental abruption was suspected and she underwent urgent Classical cesarean section.  See procedure note for details.    Findings at time of delivery: A female infant in breech presentation.  Amniotic fluid - Clear.  Birth weight 870 g.  Apgars of 6 and 8. Arterial pH = 7.31, Venous pH = 7.32.  Placenta with a three-vessel cord, approximately 30% abruption with evidence of old clots in the uterus. Grossly normal uterus, tubes and ovaries bilaterally. Postpartum course complicated by neonatal demise.  Patient was taken to ICN at the time of coding and elected coding to stop and rather, to hold her baby      History of preterm  delivery 09/06/2014  by Rod Can, CNM No   Overview Signed 07/08/2016  3:20 PM by Rod Can, CNM    Overview:  With PPROM at 26 wks. 22 yo G1P0 admitted at 74w5dafter PPROM.  She was started on latency antibiotics.  Received ICN consult.  She had previously received BMZ and did not received rescue course on admission.  On HD#4 she began having vaginal bleeding.  She was placed on fetal heart rate monitor to ensure fetal well-being.  She continued to have bleeding and new onset abdominal pain.  Placental abruption was suspected and she underwent urgent Classical cesarean section.  See procedure note for details.    Findings at time of delivery: A female infant in breech presentation.  Amniotic fluid - Clear.  Birth weight 870 g.  Apgars of 6 and 8. Arterial pH = 7.31, Venous pH = 7.32.  Placenta with a three-vessel cord, approximately 30% abruption with evidence of old clots in the uterus. Grossly normal uterus, tubes and ovaries bilaterally. Postpartum course complicated by neonatal demise.  Patient was taken to ICN at the time of coding and elected coding to stop and rather, to hold her baby         Plan:  ) Acute blood loss anemia - hemodynamically stable and asymptomatic - po ferrous sulfate  2) Blood Type --/--/O POS (12/12 1051) / Rubella <0.90 (05/29 1422) / Varicella Non-Immune  - varivax and MMR at discharge  3) TDAP status administer prior to discharge  4) Feeding plan breast  5)  Education given regarding options for contraception, as well as compatibility with breast feeding if applicable.  Patient plans on Depo-Provera injections for contraception.  6) Disposition  AMalachy Mood MD, FLoura PardonOB/GYN, [facility] 12/19/2017, 9:02 AM

## 2017-12-19 NOTE — Anesthesia Postprocedure Evaluation (Signed)
Anesthesia Post Note  Patient: Maria Richmond  Procedure(s) Performed: CESAREAN SECTION (N/A Abdomen)  Patient location during evaluation: Mother Baby Anesthesia Type: Spinal Level of consciousness: oriented and awake and alert Pain management: pain level controlled Vital Signs Assessment: post-procedure vital signs reviewed and stable Respiratory status: spontaneous breathing Cardiovascular status: blood pressure returned to baseline and stable Postop Assessment: no headache, no backache, no apparent nausea or vomiting, patient able to bend at knees and able to ambulate Anesthetic complications: no     Last Vitals:  Vitals:   12/19/17 0500 12/19/17 0746  BP: 121/71 118/75  Pulse:  88  Resp:  18  Temp:  36.7 C  SpO2: 94% 99%    Last Pain:  Vitals:   12/19/17 0746  TempSrc: Oral  PainSc:                  Cleda MccreedyJoseph K Piscitello

## 2017-12-20 ENCOUNTER — Encounter: Payer: Self-pay | Admitting: Obstetrics and Gynecology

## 2017-12-20 MED ORDER — NICOTINE 21 MG/24HR TD PT24
21.0000 mg | MEDICATED_PATCH | Freq: Every day | TRANSDERMAL | Status: DC
  Filled 2017-12-20: qty 1

## 2017-12-20 NOTE — Lactation Note (Signed)
This note was copied from a baby's chart. Lactation Consultation Note  Patient Name: Maria Richmond: 12/20/2017   Mom has given bottles of formula for last 2 feedings.  When went in to assess situation, mom reports she had been cluster feeding, was not latching on and just kept screaming so gave her bottles but still wants to breast feed.  Explained to mom that the more bottles of formula that she gave the more difficult it would be to get [name] to latch to the breast and have a successful breast feeding.  Reminded mom what we had discussed about [name] starting to cluster feed around day 2 to 3 to tell the body to bring in the mature milk and ensure a plentiful milk supply.  Lactation name and number is written on white board.  Encouraged mom to call when [name] wakes and demonstrates hunger cues.  Maternal Data    Feeding Feeding Type: Bottle Fed - Formula Nipple Type: Slow - flow  LATCH Score                   Interventions    Lactation Tools Discussed/Used     Consult Status      Louis MeckelWilliams, [name] 12/20/2017, 1:03 PM

## 2017-12-20 NOTE — Progress Notes (Signed)
Pt had requested pain medication upon entering room pt was asleep and baby was in bed with pt. Removed baby from bed and placed in bassinet. Pt did not wake during assessment of baby. FOB in chair in room. Told FOB if mom woke up and wanted pain medicine to notify this nurse. FOB verbalized understanding

## 2017-12-20 NOTE — Progress Notes (Signed)
Subjective:  No concerns, having some neck pain that started yesterday when she turned otherwise doing well.  Minimal lochia  Objective:  Vital signs in last 24 hours: Temp:  [98 F (36.7 C)-98.4 F (36.9 C)] 98.2 F (36.8 C) (12/15 0802) Pulse Rate:  [88-91] 89 (12/15 0802) Resp:  [14-20] 18 (12/15 0802) BP: (121-146)/(73-89) 125/73 (12/15 0802) SpO2:  [96 %-100 %] 98 % (12/15 0802)    Intake/Output      12/14 0701 - 12/15 0700 12/15 0701 - 12/16 0700   P.O. 240    I.V. (mL/kg) 0 (0)    Total Intake(mL/kg) 240 (2.8)    Urine (mL/kg/hr)     Blood     Total Output     Net +240           General: NAD Pulmonary: no increased work of breathing Abdomen: non-distended, non-tender, fundus firm at level of umbilicus Incision: D/C/I Extremities: no edema, no erythema, no tenderness  Results for orders placed or performed during the hospital encounter of 12/18/17 (from the past 72 hour(s))  Urine Drug Screen, Qualitative ([facility] only)     Status: None   Collection Time: 12/18/17  4:00 PM  Result Value Ref Range   Tricyclic, Ur Screen NONE DETECTED NONE DETECTED   Amphetamines, Ur Screen NONE DETECTED NONE DETECTED   MDMA (Ecstasy)Ur Screen NONE DETECTED NONE DETECTED   Cocaine Metabolite,Ur [place] NONE DETECTED NONE DETECTED   Opiate, Ur Screen NONE DETECTED NONE DETECTED   Phencyclidine (PCP) Ur S NONE DETECTED NONE DETECTED   Cannabinoid 50 Ng, Ur [place] NONE DETECTED NONE DETECTED   Barbiturates, Ur Screen NONE DETECTED NONE DETECTED   Benzodiazepine, Ur Scrn NONE DETECTED NONE DETECTED   Methadone Scn, Ur NONE DETECTED NONE DETECTED    Comment: (NOTE) Tricyclics + metabolites, urine    Cutoff 1000 ng/mL Amphetamines + metabolites, urine  Cutoff 1000 ng/mL MDMA (Ecstasy), urine              Cutoff 500 ng/mL Cocaine Metabolite, urine          Cutoff 300 ng/mL Opiate + metabolites, urine        Cutoff 300 ng/mL Phencyclidine (PCP), urine         Cutoff 25 ng/mL Cannabinoid,  urine                 Cutoff 50 ng/mL Barbiturates + metabolites, urine  Cutoff 200 ng/mL Benzodiazepine, urine              Cutoff 200 ng/mL Methadone, urine                   Cutoff 300 ng/mL The urine drug screen provides only a preliminary, unconfirmed analytical test result and should not be used for non-medical purposes. Clinical consideration and professional judgment should be applied to any positive drug screen result due to possible interfering substances. A more specific alternate chemical method must be used in order to obtain a confirmed analytical result. Gas chromatography / mass spectrometry (GC/MS) is the preferred confirmat ory method. Performed at [facility], [address] [place], [place] [address]   CBC     Status: Abnormal   Collection Time: 12/19/17  6:33 AM  Result Value Ref Range   WBC 8.2 4.0 - 10.5 K/uL   RBC 3.48 (L) 3.87 - 5.11 MIL/uL   Hemoglobin 9.7 (L) 12.0 - 15.0 g/dL   HCT 60.4 (L) 54.0 - 98.1 %   MCV  88.5 80.0 - 100.0 fL   MCH 27.9 26.0 - 34.0 pg   MCHC 31.5 30.0 - 36.0 g/dL   RDW 40.912.9 81.111.5 - 91.415.5 %   Platelets 147 (L) 150 - 400 K/uL   nRBC 0.0 0.0 - 0.2 %    Comment: Performed at [facility], [address] [place], [place] [id number]    There is no immunization history for the selected administration types on file for this patient.  Assessment:   22 y.o. [id number] postoperativeday # 2 RLTCS   Plan:  ) Acute blood loss anemia - hemodynamically stable and asymptomatic - po ferrous sulfate  2) Blood Type --/--/O POS (12/12 1051) / Rubella <0.90 (05/29 1422) / Varicella Non-Immune  3) TDAP status up to date  4) Feeding plan breast  5)  Education given regarding options for contraception, as well as compatibility with breast feeding if applicable.  Patient plans on Depo-Provera injections for contraception.  6) Disposition anticipate discharge POD3  Vena AustriaAndreas Todd Jelinski, MD, Merlinda FrederickFACOG [facility], [facility] 12/20/2017, 10:38 AM

## 2017-12-20 NOTE — Lactation Note (Signed)
This note was copied from a baby's chart. Lactation Consultation Note  Patient Name: Maria Richmond Today's Date: 12/20/2017   Grandmother of baby had called out for more bottles of formula to feed baby because she was screaming per GOB.  Went in and discussed with mom.  Mom wanted assistance to latch [name].  She latched to left breast with minimal assistance after a couple of attempts and began good rhythmic sucking with swallows that were pointed out to mom.  Father of baby seemed supportive of mom breast feeding, but that was not the case with the grandmother of the baby.  She was not happy that the baby was crying and we did not bring a bottle right away.  She was commenting also that mom needed to sleep and she was starving the baby by trying to breast feed.  Demonstrated to mom how much colostrum she had when hand expressed and that it was mom's decision to make and she had asked for lactation's help with breast feeding.  Lactation number was on white board and encouraged mom to call if she needed assistance.  Maternal Data    Feeding    LATCH Score                   Interventions    Lactation Tools Discussed/Used     Consult Status      Maria Richmond, Maria Richmond 12/20/2017, 8:35 PM

## 2017-12-21 MED ORDER — OXYCODONE-ACETAMINOPHEN 5-325 MG PO TABS: 1.0000 | ORAL_TABLET | Freq: Four times a day (QID) | ORAL | 0 refills | Status: DC | PRN

## 2017-12-21 MED ORDER — IBUPROFEN 600 MG PO TABS: 600.0000 mg | ORAL_TABLET | Freq: Four times a day (QID) | ORAL | 0 refills | Status: DC | PRN

## 2017-12-21 NOTE — Progress Notes (Signed)
Patient discharged home with infant. Discharge instructions and prescriptions given and reviewed with patient. Patient verbalized understanding. Declined rubella and varicella vaccines. Pt will get Depo shot at post-op appointment 12/25/17. Escorted out by auxillary.

## 2017-12-21 NOTE — Discharge Summary (Signed)
OB Discharge Summary     Patient Name: Maria Richmond DOB: 05-Sep-1995 MRN: [id number]  Date of admission: 12/18/2017 Delivering MD: Adelene Idler, MD Date of Delivery: 12/18/2017  Date of discharge: 12/21/2017  Admitting diagnosis: PREVIOUS CLASSICAL CESAREAN SECTION Intrauterine pregnancy: [redacted]w[redacted]d     Secondary diagnosis: None     Discharge diagnosis: Term Pregnancy Delivered, Reasons for cesarean section  Prior uterine surgery prior classical                         Hospital course:  Sceduled C/S   22 y.o. yo [id number] at [redacted]w[redacted]d was admitted to the hospital 12/18/2017 for scheduled cesarean section with the following indication:Prior Uterine Surgery.  Membrane Rupture Time/Date: 12:59 PM ,12/18/2017   Patient delivered a Viable infant.12/18/2017  Details of operation can be found in separate operative note.  Pateint had an uncomplicated postpartum course.  She is ambulating, tolerating a regular diet, passing flatus, and urinating well. Patient is discharged home in stable condition on  12/21/17                                                                        Post partum procedures:none  Complications: None  Physical exam on 12/21/2017: Vitals:   12/20/17 0802 12/20/17 1700 12/21/17 0002 12/21/17 0905  BP: 125/73 130/84 (!) 131/94 (!) 135/93  Pulse: 89 98 90 93  Resp: 18 17 20 16   Temp: 98.2 F (36.8 C) 98.4 F (36.9 C) 98.4 F (36.9 C) 98.5 F (36.9 C)  TempSrc: Oral Oral Oral Oral  SpO2: 98% 100% 99% 100%  Weight:      Height:       General: cooperative and no distress Lochia: appropriate Uterine Fundus: firm Incision: Dressing is clean, dry, and intact DVT Evaluation: No evidence of DVT seen on physical exam.  Labs: Lab Results  Component Value Date   WBC 8.2 12/19/2017   HGB 9.7 (L) 12/19/2017   HCT 30.8 (L) 12/19/2017   MCV 88.5 12/19/2017   PLT 147 (L) 12/19/2017   CMP Latest Ref Rng & Units 06/30/2017  Glucose 70 - 99 mg/dL 77  BUN 6 -  20 mg/dL 7  Creatinine 1.47 - 8.29 mg/dL 5.62(Z)  Sodium 308 - 657 mmol/L 134(L)  Potassium 3.5 - 5.1 mmol/L 3.6  Chloride 98 - 111 mmol/L 103  CO2 22 - 32 mmol/L 22  Calcium 8.9 - 10.3 mg/dL 9.0  Total Protein 6.5 - 8.1 g/dL 7.1  Total Bilirubin 0.3 - 1.2 mg/dL 0.3  Alkaline Phos 38 - 126 U/L 46  AST 15 - 41 U/L 22  ALT 0 - 44 U/L 16    Discharge instruction: per After Visit Summary.  Medications:  Allergies as of 12/21/2017   No Known Allergies     Medication List    STOP taking these medications   hydroxyprogesterone caproate 250 mg/mL Oil injection Commonly known as:  MAKENA     TAKE these medications   ibuprofen 600 MG tablet Commonly known as:  ADVIL,MOTRIN Take 1 tablet (600 mg total) by mouth every 6 (six) hours as needed.   oxyCODONE-acetaminophen 5-325 MG tablet Commonly known as:  PERCOCET/ROXICET Take 1-2 tablets by mouth every 6 (  six) hours as needed.   prenatal multivitamin Tabs tablet Take 1 tablet by mouth daily at 12 noon.            Discharge Care Instructions  (From admission, onward)         Start     Ordered   12/21/17 0000  Discharge wound care:    Comments:  You may apply a light dressing for minor discharge from the incision or to keep waistbands of clothing from rubbing.  You may also have been discharge with a clear dressing in which case this will be removed at your postoperative clinic visit.  You may shower, use soap on your incision.  Avoid baths or soaking the incision in the first 6 weeks following your surgery..   12/21/17 1103          Diet: routine diet  Activity: Advance as tolerated. Pelvic rest for 6 weeks.   Outpatient follow up: Follow-up Information    Schuman, Jaquelyn Bitter, MD In 1 week.   Specialty:  Obstetrics and Gynecology Why:  Incision check Contact information: [address] [place] [address] [phone]             Postpartum contraception: Depo Provera Rhogam Given postpartum:  no Rubella vaccine given postpartum: no (declined) Varicella vaccine given postpartum: no (declined) TDaP given antepartum or postpartum: No (declined), last TDaP 05/13/2016  Newborn Data: Live born female  Birth Weight: 6 lb 0.3 oz (2730 g) APGAR: 9, 9  Newborn Delivery   Birth date/time:  12/18/2017 13:00:00 Delivery type:  C-Section, Vacuum Assisted Trial of labor:  No C-section categorization:  Repeat      Baby Feeding: Breast and formula  Disposition: pending Pediatrics decision to discharge home or mom will board with baby if needed  SIGNED:  Oswaldo Conroy, CNM 12/21/2017 11:05 AM

## 2017-12-25 ENCOUNTER — Ambulatory Visit: Payer: Medicaid Other | Admitting: Obstetrics and Gynecology

## 2017-12-27 ENCOUNTER — Encounter: Payer: Self-pay | Admitting: Physician Assistant

## 2017-12-27 ENCOUNTER — Emergency Department
Admission: EM | Admit: 2017-12-27 | Discharge: 2017-12-27 | Disposition: A | Discharge status: Home / Self Care | Payer: Medicaid Other | Attending: Emergency Medicine | Admitting: Emergency Medicine

## 2017-12-27 ENCOUNTER — Other Ambulatory Visit: Payer: Self-pay

## 2017-12-27 DIAGNOSIS — J029 Acute pharyngitis, unspecified: Secondary | ICD-10-CM

## 2017-12-27 DIAGNOSIS — R509 Fever, unspecified: Secondary | ICD-10-CM | POA: Diagnosis not present

## 2017-12-27 DIAGNOSIS — Z87891 Personal history of nicotine dependence: Secondary | ICD-10-CM | POA: Diagnosis not present

## 2017-12-27 DIAGNOSIS — R070 Pain in throat: Secondary | ICD-10-CM | POA: Insufficient documentation

## 2017-12-27 LAB — GROUP A STREP BY PCR: Group A Strep by PCR: NOT DETECTED

## 2017-12-27 MED ORDER — AMOXICILLIN 500 MG PO CAPS: 500.0000 mg | ORAL_CAPSULE | Freq: Three times a day (TID) | ORAL | 0 refills | Status: AC

## 2017-12-27 NOTE — Discharge Instructions (Signed)
You are being treated for strep throat. Take the antibiotic until all pills are gone. Change your toothbrush after 24-hours on the antibiotic.

## 2017-12-27 NOTE — ED Triage Notes (Signed)
Pt states sore throat since last pm. Pt states she believes she did have a fever last pm. Pt denies other symptoms.
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
## 2018-01-18 IMAGING — US US MFM OB TRANSVAGINAL
1 series · 7 of 7 positions shown · non-contrast
Comparison: none

PATIENT INFO:

PERFORMED BY:
[facility]
SERVICE(S) PROVIDED:
US MFM OB TRANSVAGINAL                                76817.2
INDICATIONS:
23 weeks gestation of pregnancy
FETAL EVALUATION:
Num Of Fetuses:     1
Fetal Heart         132
Rate(bpm):
Cardiac Activity:   Present
Presentation:       Breech
Placenta:           Posterior
GESTATIONAL AGE:
LMP:           30w 0d        Date:  09/17/15                 EDD:   06/23/16
Best:          23w 5d     Det. By:  U/S C R L  (01/31/16)    EDD:   08/06/16
CERVIX UTERUS ADNEXA:
Cervix
Length:            4.1  cm.
Comment:      No funneling seen with fundal pressure

[Series 1: us mfm ob transvaginal · 0.10mm/px · 7 of 7 slices shown]
[im 1/7]
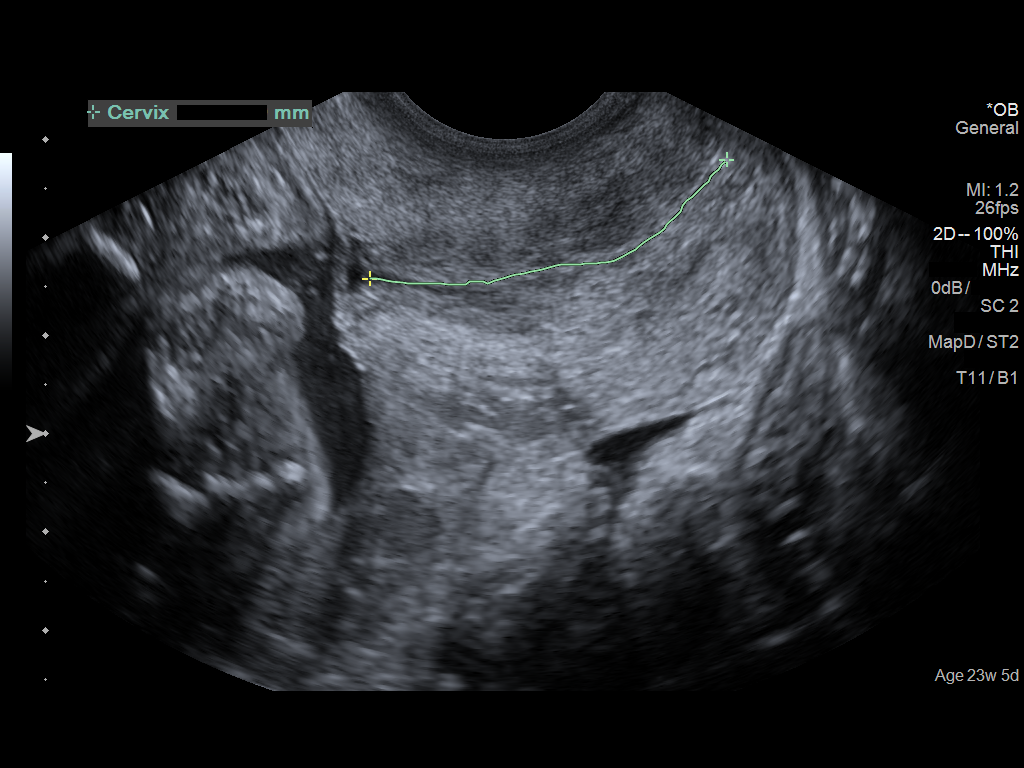
[im 2/7]
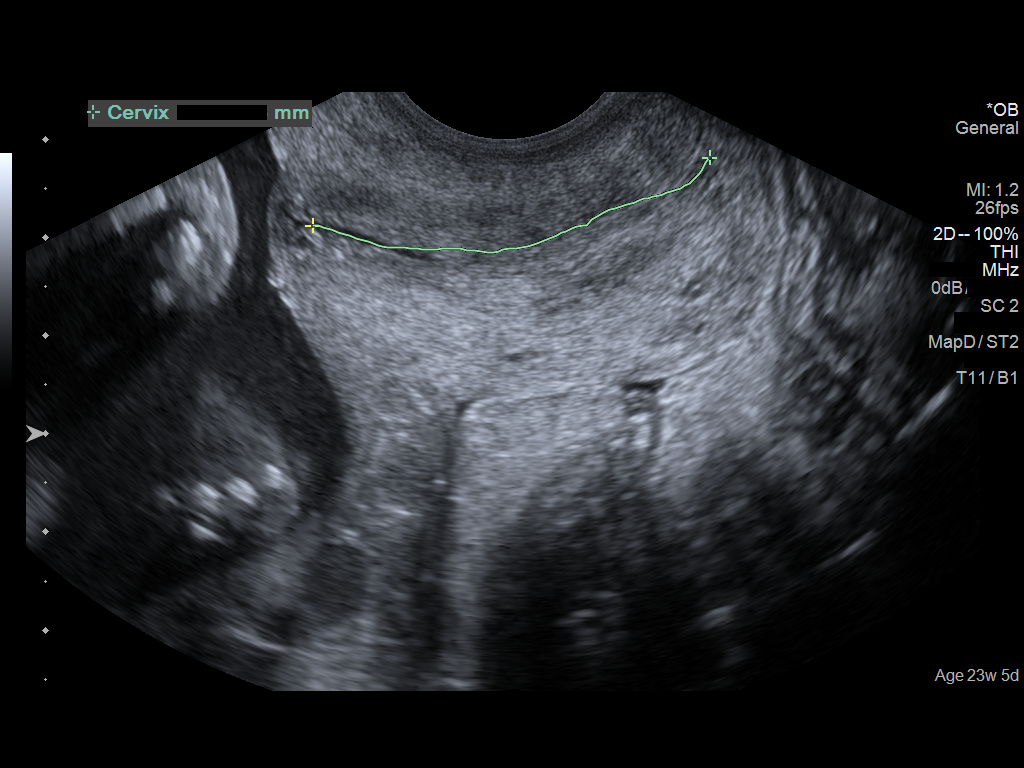
[im 3/7]
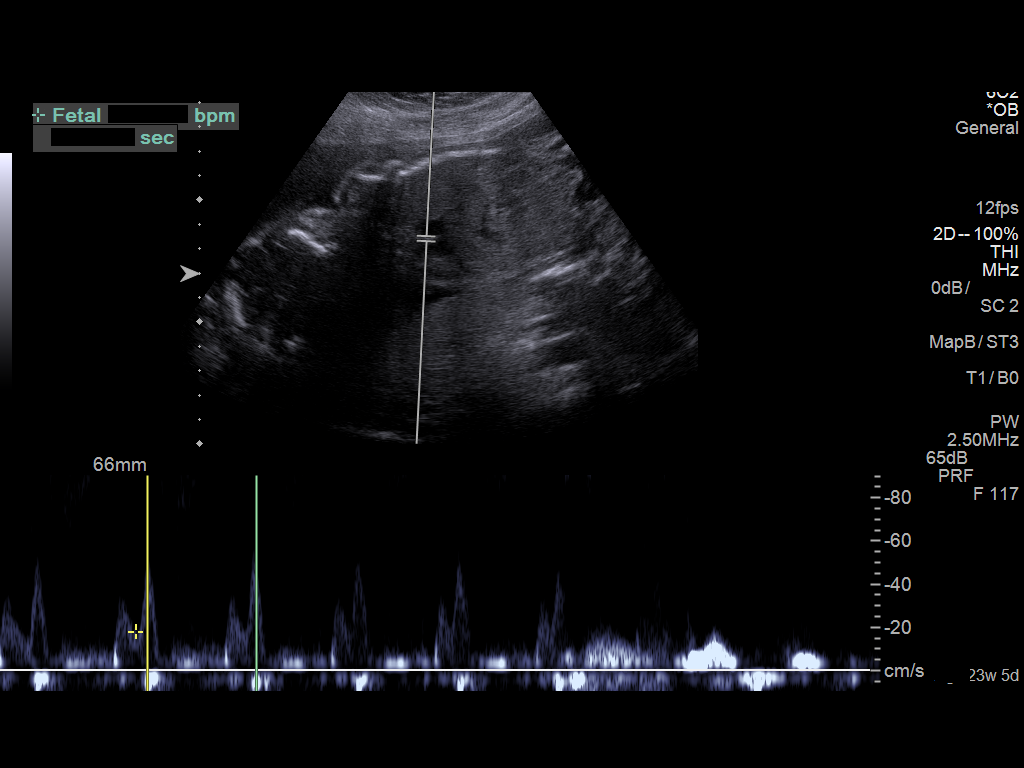
[im 4/7]
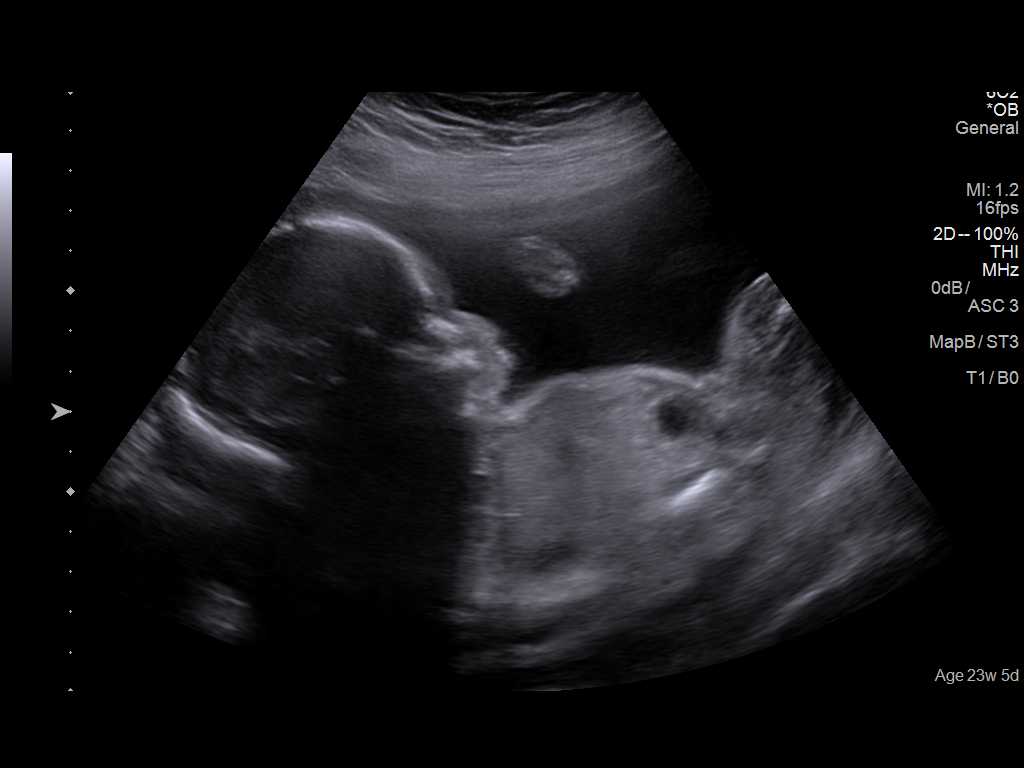
[im 5/7]
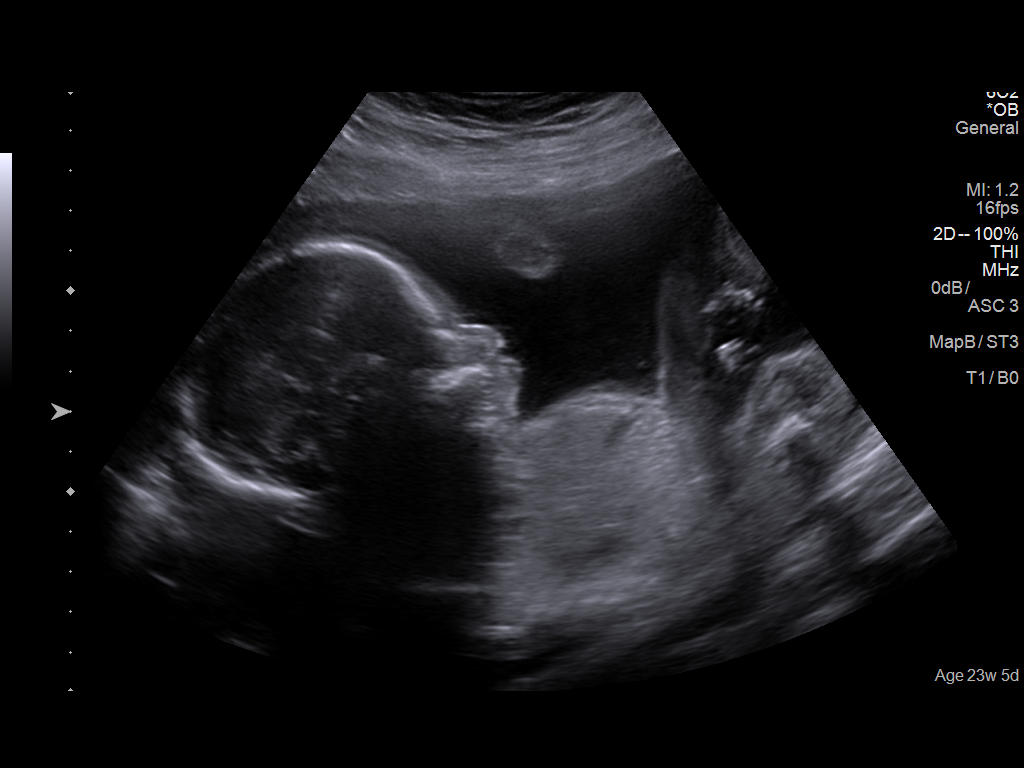
[im 6/7]
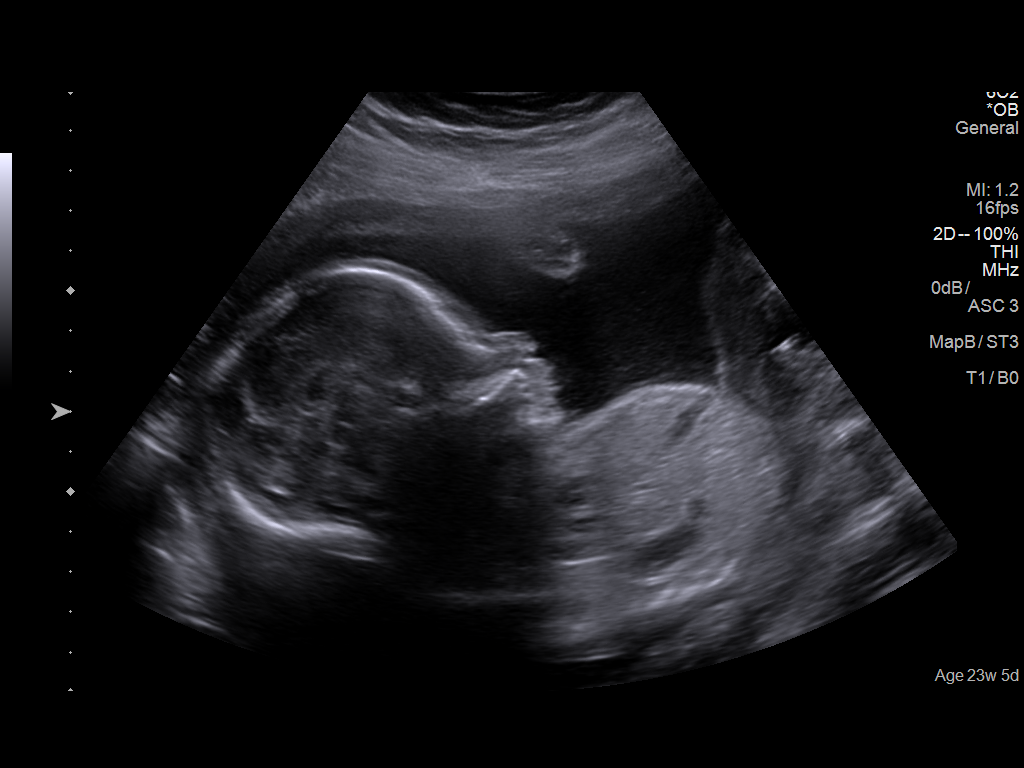
[im 7/7]
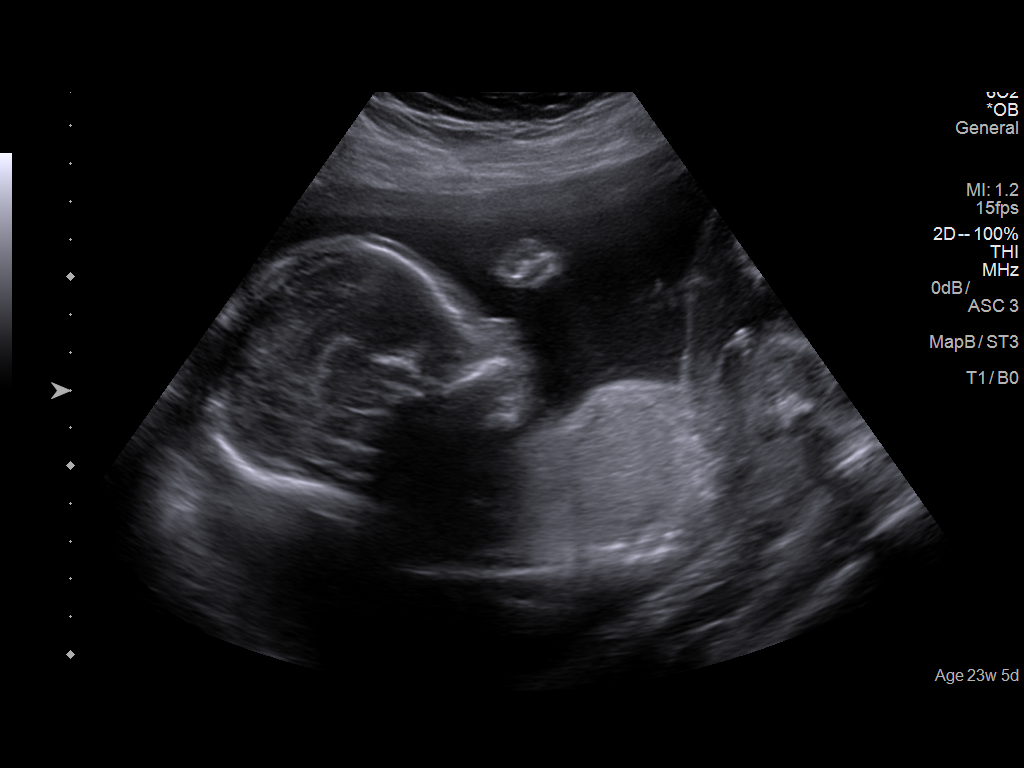

[7 of 7 positions shown; findings below may reference images not displayed]

IMPRESSION: Dear Dr. ENCEP,

Thank you for referring your patient for a transvaginal
ultrasound to evaluate the cervical length.  Her history is
significant for a 25w preterm delivery.

Ultrasound demonstrates a single, live intrauterine pregnancy
at 23 weeks 5 days.  Dating is by ultrasound performed at
[facility] on 01/31/16; measurements were consistent
with 13 weeks 1 day.

On transvaginal ultrasound, the cervical length measured
cm.
There was no evidence of funneling, dynamic changes, or
response to fundal pressure.

Recommend follow up fetal growth and final cervical length in
2 weeks.

Thank you for allowing us to participate in your patient's care.
assistance.

## 2018-02-05 ENCOUNTER — Emergency Department
Admission: EM | Admit: 2018-02-05 | Discharge: 2018-02-05 | Disposition: A | Discharge status: Home / Self Care | Payer: Medicaid Other | Attending: Emergency Medicine | Admitting: Emergency Medicine

## 2018-02-05 ENCOUNTER — Other Ambulatory Visit: Payer: Self-pay

## 2018-02-05 DIAGNOSIS — J029 Acute pharyngitis, unspecified: Secondary | ICD-10-CM | POA: Diagnosis present

## 2018-02-05 DIAGNOSIS — J020 Streptococcal pharyngitis: Secondary | ICD-10-CM

## 2018-02-05 DIAGNOSIS — Z87891 Personal history of nicotine dependence: Secondary | ICD-10-CM | POA: Insufficient documentation

## 2018-02-05 DIAGNOSIS — R509 Fever, unspecified: Secondary | ICD-10-CM

## 2018-02-05 LAB — INFLUENZA PANEL BY PCR (TYPE A & B)
Influenza A By PCR: NEGATIVE
Influenza B By PCR: NEGATIVE

## 2018-02-05 LAB — GROUP A STREP BY PCR: Group A Strep by PCR: DETECTED — AB

## 2018-02-05 MED ORDER — AMOXICILLIN-POT CLAVULANATE 875-125 MG PO TABS: 1.0000 | ORAL_TABLET | Freq: Two times a day (BID) | ORAL | 0 refills | Status: DC

## 2018-02-05 MED ORDER — IBUPROFEN 800 MG PO TABS
ORAL_TABLET | ORAL | Status: AC
  Filled 2018-02-05: qty 1

## 2018-02-05 MED ORDER — IBUPROFEN 600 MG PO TABS: 600.0000 mg | ORAL_TABLET | Freq: Three times a day (TID) | ORAL | 0 refills | Status: DC | PRN

## 2018-02-05 MED ORDER — IBUPROFEN 800 MG PO TABS
800.0000 mg | ORAL_TABLET | Freq: Once | ORAL | Status: AC
  Administered 2018-02-05: 800 mg via ORAL

## 2018-02-05 MED ORDER — ACETAMINOPHEN 325 MG PO TABS
650.0000 mg | ORAL_TABLET | Freq: Once | ORAL | Status: AC
  Administered 2018-02-05: 650 mg via ORAL
  Filled 2018-02-05: qty 2

## 2018-02-05 MED ORDER — AMOXICILLIN-POT CLAVULANATE 875-125 MG PO TABS
1.0000 | ORAL_TABLET | Freq: Once | ORAL | Status: AC
  Administered 2018-02-05: 1 via ORAL
  Filled 2018-02-05: qty 1

## 2018-02-05 NOTE — Discharge Instructions (Signed)
You were seen in the ER today for sore throat.  Your strep test was positive.  Your flu test was negative.  We gave you your first dose of antibiotics in the ER.  I have provided you with a prescription for Augmentin 2 times daily for the next 10 days.  I have also provided you with a prescription for ibuprofen every 8 hours as needed with food.  You can also try salt water gargles as needed for throat discomfort.

## 2018-02-05 NOTE — ED Triage Notes (Signed)
Patient reports symptoms began yesterday - sore throat, fever and chills.

## 2018-02-05 NOTE — ED Notes (Signed)
Sore thoat, body ache and fever since last night. Denies n/v/d

## 2018-02-05 NOTE — ED Notes (Signed)
stre and flu results pending.
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
## 2018-07-13 ENCOUNTER — Other Ambulatory Visit: Payer: Self-pay

## 2018-07-13 ENCOUNTER — Emergency Department
Admission: EM | Admit: 2018-07-13 | Discharge: 2018-07-13 | Disposition: A | Discharge status: Home / Self Care | Payer: Medicaid Other | Attending: Emergency Medicine | Admitting: Emergency Medicine

## 2018-07-13 ENCOUNTER — Encounter: Payer: Self-pay | Admitting: Emergency Medicine

## 2018-07-13 DIAGNOSIS — Y9389 Activity, other specified: Secondary | ICD-10-CM | POA: Diagnosis not present

## 2018-07-13 DIAGNOSIS — S299XXA Unspecified injury of thorax, initial encounter: Secondary | ICD-10-CM | POA: Diagnosis present

## 2018-07-13 DIAGNOSIS — Y990 Civilian activity done for income or pay: Secondary | ICD-10-CM | POA: Diagnosis not present

## 2018-07-13 DIAGNOSIS — S239XXA Sprain of unspecified parts of thorax, initial encounter: Secondary | ICD-10-CM | POA: Insufficient documentation

## 2018-07-13 DIAGNOSIS — X509XXA Other and unspecified overexertion or strenuous movements or postures, initial encounter: Secondary | ICD-10-CM | POA: Diagnosis not present

## 2018-07-13 DIAGNOSIS — Z87891 Personal history of nicotine dependence: Secondary | ICD-10-CM | POA: Insufficient documentation

## 2018-07-13 DIAGNOSIS — Y9289 Other specified places as the place of occurrence of the external cause: Secondary | ICD-10-CM | POA: Insufficient documentation

## 2018-07-13 MED ORDER — CYCLOBENZAPRINE HCL 5 MG PO TABS: 5.0000 mg | ORAL_TABLET | Freq: Three times a day (TID) | ORAL | 0 refills | Status: DC | PRN

## 2018-07-13 MED ORDER — IBUPROFEN 800 MG PO TABS: 800.0000 mg | ORAL_TABLET | Freq: Three times a day (TID) | ORAL | 0 refills | Status: DC | PRN
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
## 2018-07-13 NOTE — ED Triage Notes (Signed)
Pt arrives with complaints of upper back pain, worse with movement. No known injury

## 2018-07-13 NOTE — Discharge Instructions (Signed)
Your exam is consistent with a muscle strain to the midback. Take the prescription meds as directed. Follow-up with [facility] for ongoing symptoms. Apply ice and/or moist heat for continued symptoms.

## 2018-09-19 ENCOUNTER — Other Ambulatory Visit: Payer: Self-pay

## 2018-09-19 ENCOUNTER — Emergency Department
Admission: EM | Admit: 2018-09-19 | Discharge: 2018-09-19 | Disposition: A | Discharge status: Home / Self Care | Payer: Medicaid Other | Attending: Emergency Medicine | Admitting: Emergency Medicine

## 2018-09-19 DIAGNOSIS — K641 Second degree hemorrhoids: Secondary | ICD-10-CM | POA: Diagnosis present

## 2018-09-19 MED ORDER — HYDROCORTISONE ACETATE 25 MG RE SUPP: 25.0000 mg | Freq: Two times a day (BID) | RECTAL | 1 refills | Status: DC

## 2018-09-19 MED ORDER — ONDANSETRON 4 MG PO TBDP
4.0000 mg | ORAL_TABLET | Freq: Once | ORAL | Status: AC
  Administered 2018-09-19: 4 mg via ORAL
  Filled 2018-09-19: qty 1

## 2018-09-19 MED ORDER — HYDROCODONE-ACETAMINOPHEN 5-325 MG PO TABS: 1.0000 | ORAL_TABLET | Freq: Four times a day (QID) | ORAL | 0 refills | Status: AC | PRN

## 2018-09-19 MED ORDER — DIBUCAINE (PERIANAL) 1 % EX OINT: 1.0000 "application " | TOPICAL_OINTMENT | CUTANEOUS | 0 refills | Status: AC | PRN

## 2018-09-19 MED ORDER — HYDROCODONE-ACETAMINOPHEN 5-325 MG PO TABS
1.0000 | ORAL_TABLET | Freq: Once | ORAL | Status: AC
  Administered 2018-09-19: 1 via ORAL
  Filled 2018-09-19: qty 1
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
## 2018-09-19 NOTE — ED Triage Notes (Signed)
Patient reports having hemorrhoids.

## 2018-09-19 NOTE — ED Notes (Signed)
Pt states she has hemmorrhoids. C/o bleeding and pressure in rectum.

## 2018-11-26 ENCOUNTER — Emergency Department
Admission: EM | Admit: 2018-11-26 | Discharge: 2018-11-26 | Disposition: A | Discharge status: Home / Self Care | Payer: Medicaid Other | Attending: Emergency Medicine | Admitting: Emergency Medicine

## 2018-11-26 ENCOUNTER — Other Ambulatory Visit: Payer: Self-pay

## 2018-11-26 DIAGNOSIS — Z79899 Other long term (current) drug therapy: Secondary | ICD-10-CM | POA: Insufficient documentation

## 2018-11-26 DIAGNOSIS — Z87891 Personal history of nicotine dependence: Secondary | ICD-10-CM | POA: Insufficient documentation

## 2018-11-26 DIAGNOSIS — J029 Acute pharyngitis, unspecified: Secondary | ICD-10-CM | POA: Diagnosis not present

## 2018-11-26 LAB — GROUP A STREP BY PCR: Group A Strep by PCR: NOT DETECTED

## 2018-11-26 MED ORDER — AMOXICILLIN 400 MG/5ML PO SUSR: 500.0000 mg | Freq: Two times a day (BID) | ORAL | 0 refills | Status: AC

## 2018-11-26 NOTE — ED Notes (Signed)
No peripheral IV placed this visit.   Discharge instructions reviewed with patient. Questions fielded by this RN. Patient verbalizes understanding of instructions. Patient discharged home in stable condition per jackie. Pa. No acute distress noted at time of discharge.

## 2018-11-26 NOTE — Discharge Instructions (Signed)
Take Amoxicillin twice daily for the next ten days.  

## 2018-11-26 NOTE — ED Notes (Signed)
Pt c/o sore throat for the last 2 days with spots  Appears unremarkable to inspection, denies threats to swallowing or difficulty breathing
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
## 2018-11-26 NOTE — ED Triage Notes (Signed)
Patient reports sore throat for 2 days.  Reports she is seeing white spots in back of throat.  Patient reports history of strep.

## 2018-11-27 ENCOUNTER — Encounter: Payer: Self-pay | Admitting: Intensive Care

## 2018-11-27 ENCOUNTER — Emergency Department
Admission: EM | Admit: 2018-11-27 | Discharge: 2018-11-27 | Disposition: A | Discharge status: Home / Self Care | Payer: Medicaid Other | Attending: Student in an Organized Health Care Education/Training Program | Admitting: Student in an Organized Health Care Education/Training Program

## 2018-11-27 ENCOUNTER — Other Ambulatory Visit: Payer: Self-pay

## 2018-11-27 DIAGNOSIS — F17210 Nicotine dependence, cigarettes, uncomplicated: Secondary | ICD-10-CM | POA: Insufficient documentation

## 2018-11-27 DIAGNOSIS — N898 Other specified noninflammatory disorders of vagina: Secondary | ICD-10-CM

## 2018-11-27 DIAGNOSIS — R300 Dysuria: Secondary | ICD-10-CM | POA: Diagnosis present

## 2018-11-27 LAB — URINALYSIS, COMPLETE (UACMP) WITH MICROSCOPIC
Bacteria, UA: NONE SEEN
Bilirubin Urine: NEGATIVE
Glucose, UA: NEGATIVE mg/dL
Hgb urine dipstick: NEGATIVE
Ketones, ur: NEGATIVE mg/dL
Leukocytes,Ua: NEGATIVE
Nitrite: NEGATIVE
Protein, ur: NEGATIVE mg/dL
Specific Gravity, Urine: 1.019 (ref 1.005–1.030)
pH: 7 (ref 5.0–8.0)

## 2018-11-27 LAB — WET PREP, GENITAL
Clue Cells Wet Prep HPF POC: NONE SEEN
Sperm: NONE SEEN
Trich, Wet Prep: NONE SEEN
Yeast Wet Prep HPF POC: NONE SEEN

## 2018-11-27 LAB — POCT PREGNANCY, URINE: Preg Test, Ur: NEGATIVE

## 2018-11-27 MED ORDER — AZITHROMYCIN 500 MG PO TABS
1000.0000 mg | ORAL_TABLET | Freq: Once | ORAL | Status: AC
  Administered 2018-11-27: 1000 mg via ORAL
  Filled 2018-11-27: qty 2

## 2018-11-27 MED ORDER — CEFTRIAXONE SODIUM 250 MG IJ SOLR
250.0000 mg | Freq: Once | INTRAMUSCULAR | Status: AC
  Administered 2018-11-27: 250 mg via INTRAMUSCULAR
  Filled 2018-11-27: qty 250
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
## 2018-11-27 NOTE — ED Notes (Signed)
No localized reaction noted to the site of injection.

## 2018-11-27 NOTE — ED Notes (Signed)
Pt presents to the ED from home. Pt c/o burning during urination and green foul smelling discharge that has been going on for about 2 days. Pt denies any other complaints and NAD at this time.

## 2018-11-27 NOTE — ED Triage Notes (Signed)
Patient c/o burning during urination and green discharge

## 2018-11-27 NOTE — Discharge Instructions (Addendum)
You will be contacted telephonically if your GC/chlamydia tests are positive.

## 2018-12-01 ENCOUNTER — Ambulatory Visit: Payer: Medicaid Other | Admitting: Obstetrics and Gynecology

## 2018-12-01 LAB — GC/CHLAMYDIA PROBE AMP
Chlamydia trachomatis, NAA: NEGATIVE
Neisseria Gonorrhoeae by PCR: POSITIVE — AB

## 2018-12-03 ENCOUNTER — Telehealth: Payer: Self-pay | Admitting: Emergency Medicine

## 2018-12-03 NOTE — Telephone Encounter (Signed)
Called to inform of std results .  She was treated during ED visit.  She says she saw on mychart. Explained partner treatment .
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
## 2018-12-09 ENCOUNTER — Ambulatory Visit (INDEPENDENT_AMBULATORY_CARE_PROVIDER_SITE_OTHER): Payer: Medicaid Other | Admitting: Obstetrics and Gynecology

## 2018-12-09 ENCOUNTER — Encounter: Payer: Self-pay | Admitting: Obstetrics and Gynecology

## 2018-12-09 ENCOUNTER — Other Ambulatory Visit (HOSPITAL_COMMUNITY)
Admission: RE | Admit: 2018-12-09 | Discharge: 2018-12-09 | Disposition: A | Discharge status: Home / Self Care | Payer: Medicaid Other | Source: Ambulatory Visit | Attending: Obstetrics and Gynecology | Admitting: Obstetrics and Gynecology

## 2018-12-09 ENCOUNTER — Ambulatory Visit: Payer: Medicaid Other | Admitting: Obstetrics and Gynecology

## 2018-12-09 ENCOUNTER — Other Ambulatory Visit: Payer: Self-pay

## 2018-12-09 ENCOUNTER — Other Ambulatory Visit: Payer: Self-pay | Admitting: Obstetrics and Gynecology

## 2018-12-09 VITALS — BP 110/80 | Ht 62.0 in | Wt 140.0 lb

## 2018-12-09 DIAGNOSIS — N898 Other specified noninflammatory disorders of vagina: Secondary | ICD-10-CM

## 2018-12-09 DIAGNOSIS — A549 Gonococcal infection, unspecified: Secondary | ICD-10-CM | POA: Diagnosis present

## 2018-12-09 DIAGNOSIS — Z30015 Encounter for initial prescription of vaginal ring hormonal contraceptive: Secondary | ICD-10-CM

## 2018-12-09 DIAGNOSIS — Z113 Encounter for screening for infections with a predominantly sexual mode of transmission: Secondary | ICD-10-CM | POA: Diagnosis present

## 2018-12-09 MED ORDER — ETONOGESTREL-ETHINYL ESTRADIOL 0.12-0.015 MG/24HR VA RING: VAGINAL_RING | VAGINAL | 3 refills | Status: DC
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
## 2018-12-09 NOTE — Patient Instructions (Signed)
I value your feedback and entrusting us with your care. If you get a [facility] patient survey, I would appreciate you taking the time to let us know about your experience today. Thank you! 

## 2018-12-13 LAB — CERVICOVAGINAL ANCILLARY ONLY
Chlamydia: NEGATIVE
Comment: NEGATIVE
Comment: NEGATIVE
Comment: NORMAL
Neisseria Gonorrhea: NEGATIVE
Trichomonas: NEGATIVE

## 2019-02-04 LAB — HERPES SIMPLEX VIRUS CULTURE

## 2019-03-02 IMAGING — US US OB < 14 WEEKS - US OB TV
1 series · 14 of 28 positions shown · non-contrast
Comparison: None.

CLINICAL DATA: Nausea and vomiting, quantitative HCG of 84,000

EXAM:
OBSTETRIC <14 WK US AND TRANSVAGINAL OB US
TECHNIQUE: Both transabdominal and transvaginal ultrasound examinations were
performed for complete evaluation of the gestation as well as the
maternal uterus, adnexal regions, and pelvic cul-de-sac.
Transvaginal technique was performed to assess early pregnancy.

[Series 1: us ob < 14 weeks - us ob tv · 0.13mm/px · 14 of 128 slices shown]
[im 5/128]
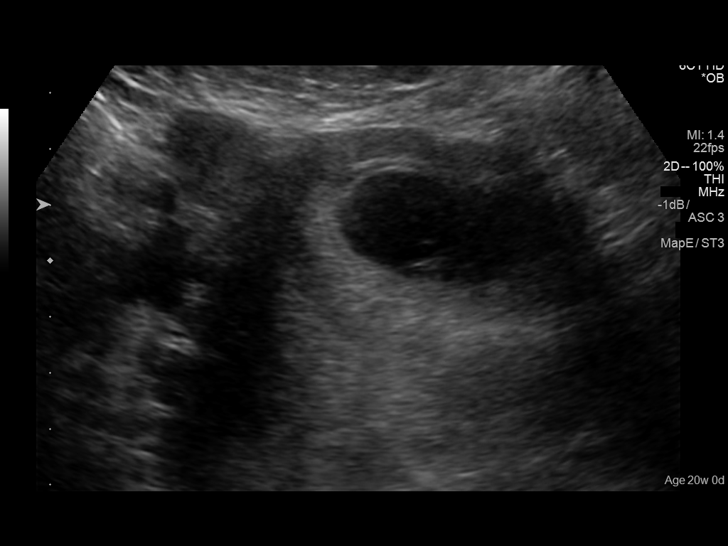
[im 15/128]
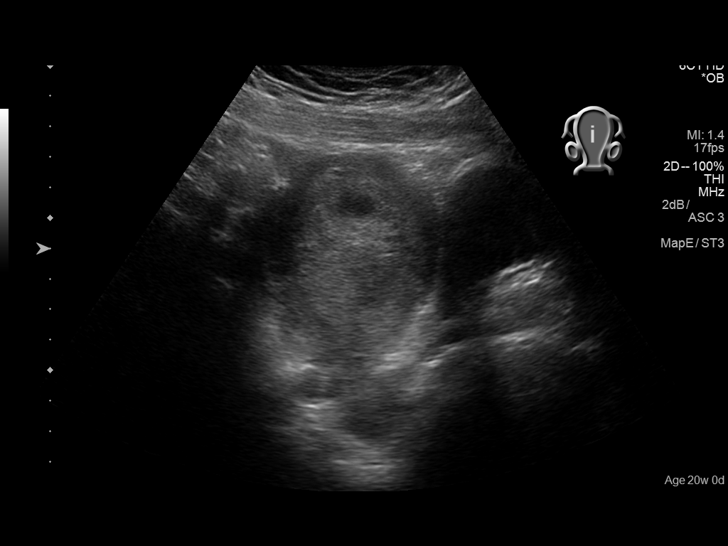
[im 24/128]
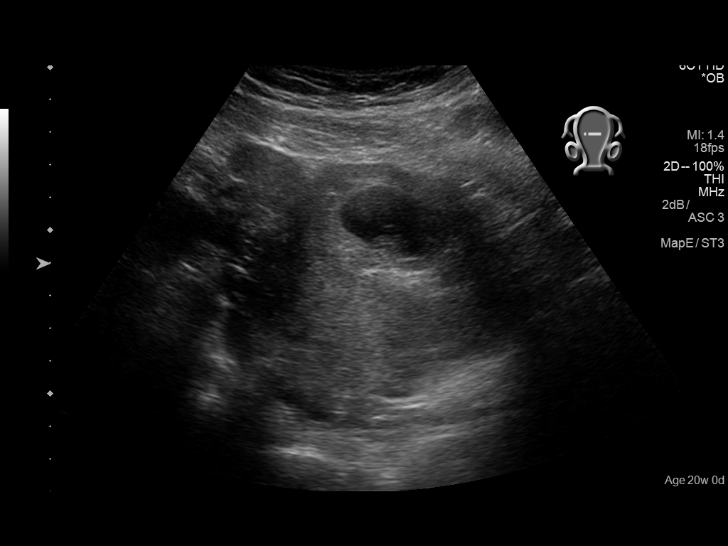
[im 33/128]
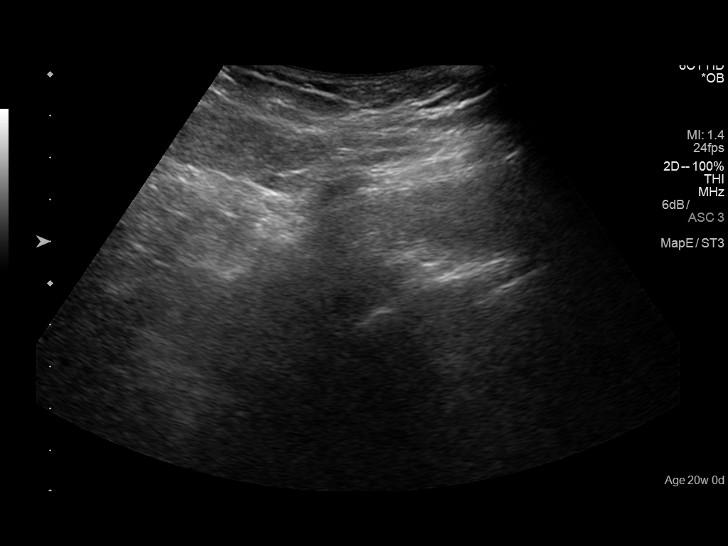
[im 43/128]
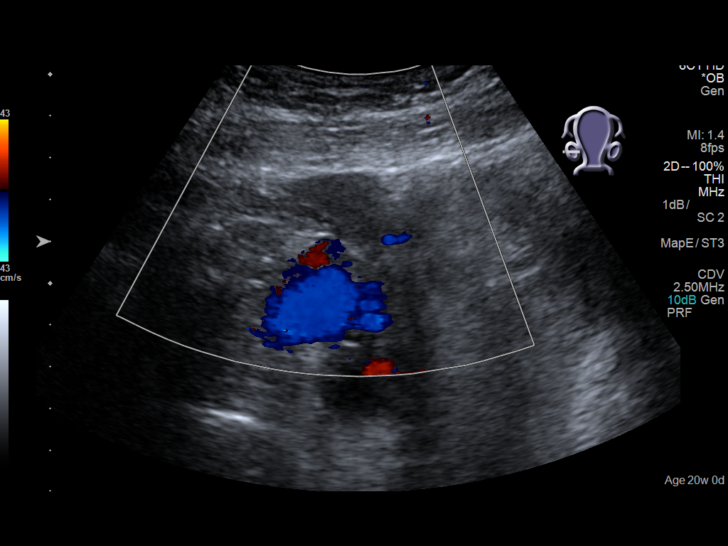
[im 52/128]
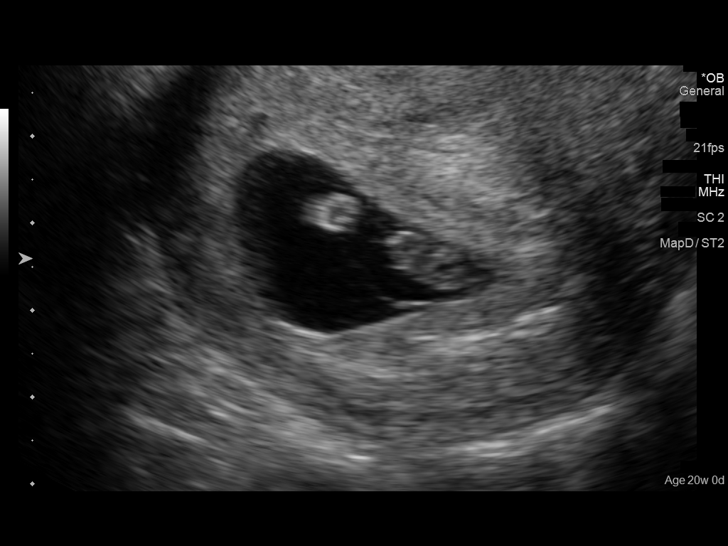
[im 62/128]
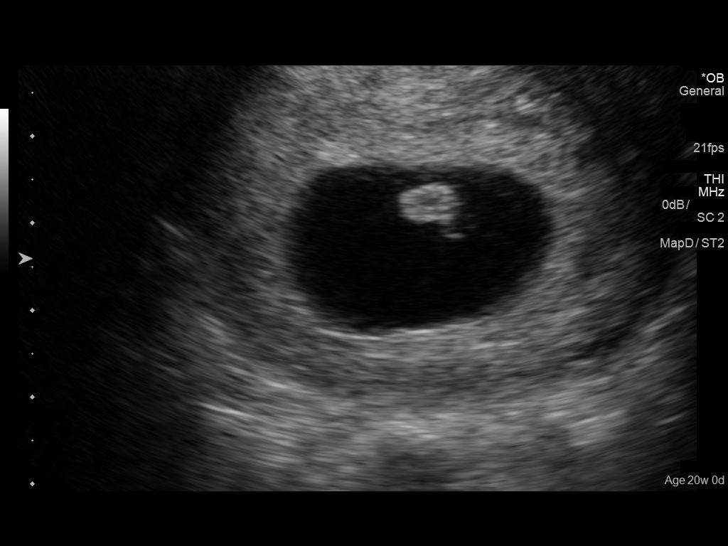
[im 71/128]
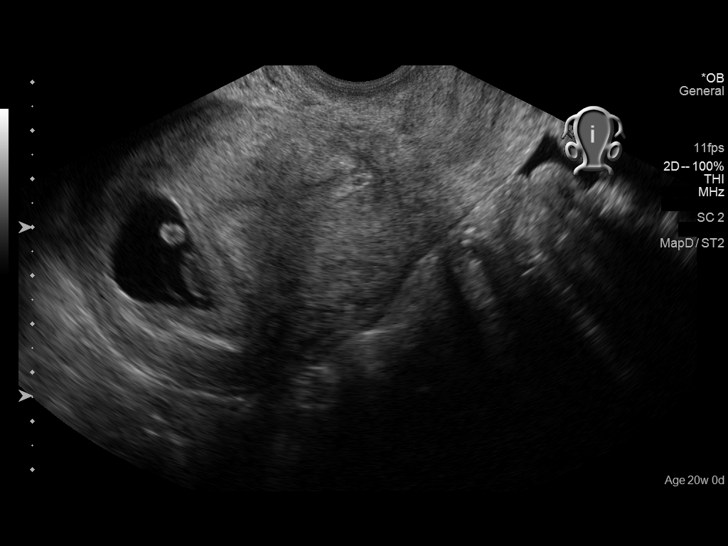
[im 80/128]
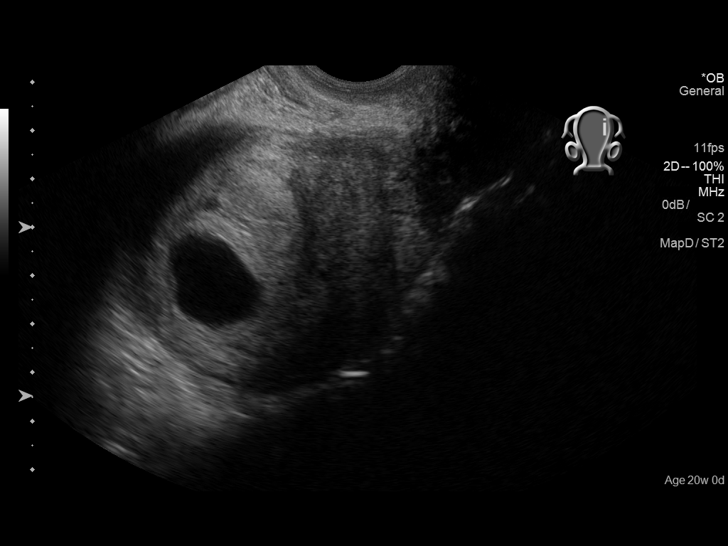
[im 90/128]
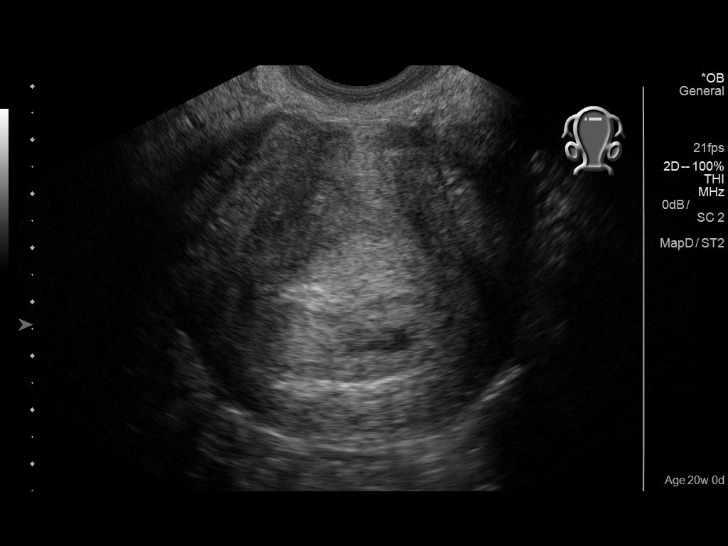
[im 99/128]
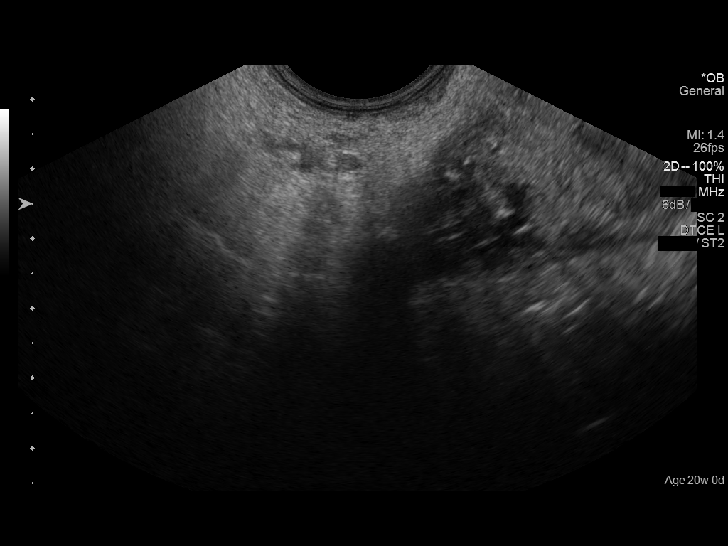
[im 109/128]
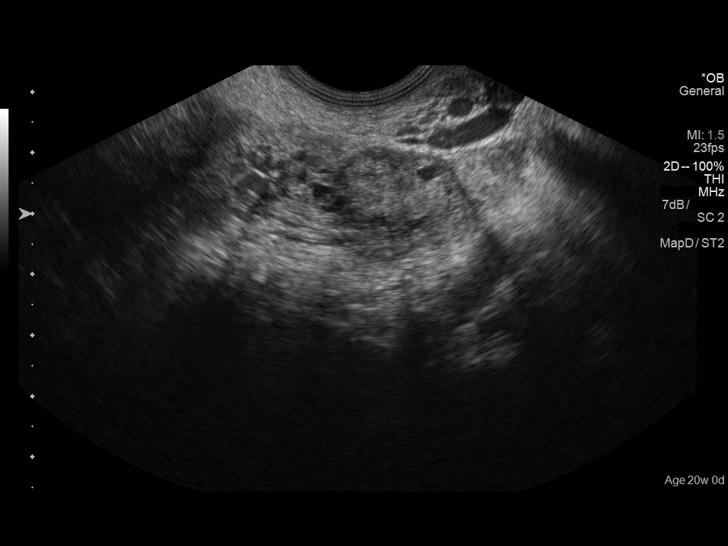
[im 118/128]
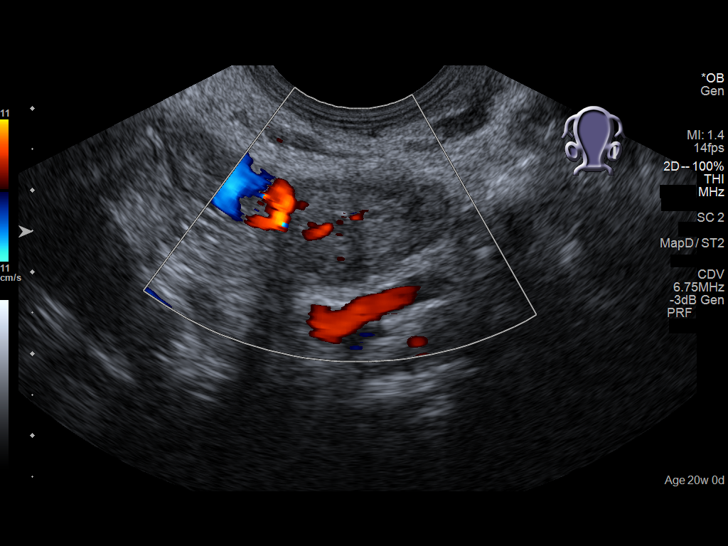
[im 128/128]
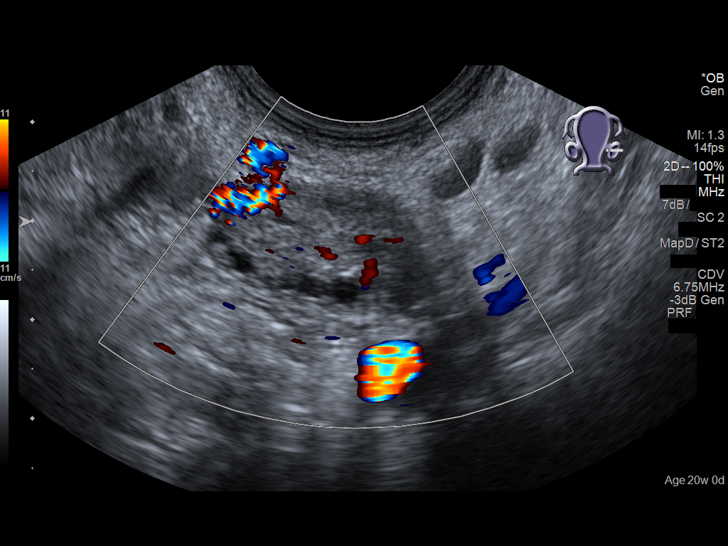

[14 of 28 positions shown; findings below may reference images not displayed]

FINDINGS: Intrauterine gestational sac: Visible

Yolk sac:  Visible

Embryo:  Visible

Cardiac Activity: Visible

Heart Rate: 158 bpm

CRL: 14.2 mm   7 w   5 d                  US EDC: 01/08/2018

Subchorionic hemorrhage:  None visualized.

Maternal uterus/adnexae: Ovaries are within normal limits. The left
ovary measures 2.1 x 1.6 x 2.1 cm. Right ovary measures 2.4 x 2.1 x
2.1 cm. Small amount of free fluid.
IMPRESSION: 1. Single viable intrauterine pregnancy as above
2. Small amount of free fluid in the pelvis

## 2019-04-19 ENCOUNTER — Encounter: Payer: Self-pay | Admitting: Emergency Medicine

## 2019-04-19 ENCOUNTER — Other Ambulatory Visit: Payer: Self-pay

## 2019-04-19 ENCOUNTER — Emergency Department
Admission: EM | Admit: 2019-04-19 | Discharge: 2019-04-19 | Disposition: A | Discharge status: Home / Self Care | Payer: Medicaid Other | Attending: Student in an Organized Health Care Education/Training Program | Admitting: Student in an Organized Health Care Education/Training Program

## 2019-04-19 DIAGNOSIS — Z79899 Other long term (current) drug therapy: Secondary | ICD-10-CM | POA: Insufficient documentation

## 2019-04-19 DIAGNOSIS — M542 Cervicalgia: Secondary | ICD-10-CM | POA: Diagnosis present

## 2019-04-19 DIAGNOSIS — M436 Torticollis: Secondary | ICD-10-CM | POA: Diagnosis not present

## 2019-04-19 DIAGNOSIS — F17210 Nicotine dependence, cigarettes, uncomplicated: Secondary | ICD-10-CM | POA: Insufficient documentation

## 2019-04-19 MED ORDER — ORPHENADRINE CITRATE 30 MG/ML IJ SOLN
60.0000 mg | Freq: Once | INTRAMUSCULAR | Status: AC
  Administered 2019-04-19: 60 mg via INTRAMUSCULAR
  Filled 2019-04-19: qty 2

## 2019-04-19 MED ORDER — MELOXICAM 15 MG PO TABS: 15.0000 mg | ORAL_TABLET | Freq: Every day | ORAL | 0 refills | Status: DC

## 2019-04-19 MED ORDER — METHOCARBAMOL 500 MG PO TABS: 500.0000 mg | ORAL_TABLET | Freq: Four times a day (QID) | ORAL | 0 refills | Status: DC

## 2019-04-19 MED ORDER — KETOROLAC TROMETHAMINE 30 MG/ML IJ SOLN
30.0000 mg | Freq: Once | INTRAMUSCULAR | Status: AC
  Administered 2019-04-19: 30 mg via INTRAMUSCULAR
  Filled 2019-04-19: qty 1

## 2019-04-19 NOTE — ED Notes (Addendum)
See triage note-C/o right sided neck pain that started last Thursday. Pt reports she can't turn her head to the right. Pt is NAD at this time.
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
## 2019-04-19 NOTE — ED Triage Notes (Signed)
Pt c/o neck pain, not able to turn head to the right . Pt is ambulatory to triage with a steady gait.

## 2019-06-12 ENCOUNTER — Encounter (HOSPITAL_COMMUNITY): Payer: Self-pay | Admitting: Emergency Medicine

## 2019-06-12 ENCOUNTER — Other Ambulatory Visit: Payer: Self-pay

## 2019-06-12 ENCOUNTER — Emergency Department (HOSPITAL_COMMUNITY)
Admission: EM | Admit: 2019-06-12 | Discharge: 2019-06-12 | Disposition: A | Discharge status: Home / Self Care | Payer: Medicaid Other | Attending: Emergency Medicine | Admitting: Emergency Medicine

## 2019-06-12 DIAGNOSIS — R21 Rash and other nonspecific skin eruption: Secondary | ICD-10-CM | POA: Diagnosis present

## 2019-06-12 DIAGNOSIS — F17210 Nicotine dependence, cigarettes, uncomplicated: Secondary | ICD-10-CM | POA: Diagnosis not present

## 2019-06-12 DIAGNOSIS — Z793 Long term (current) use of hormonal contraceptives: Secondary | ICD-10-CM | POA: Diagnosis not present

## 2019-06-12 DIAGNOSIS — L245 Irritant contact dermatitis due to other chemical products: Secondary | ICD-10-CM | POA: Insufficient documentation

## 2019-06-12 MED ORDER — HYDROCORTISONE 1 % EX CREA
1.0000 "application " | TOPICAL_CREAM | Freq: Once | CUTANEOUS | Status: AC
  Administered 2019-06-12: 1 via TOPICAL
  Filled 2019-06-12: qty 28

## 2019-06-12 NOTE — Discharge Instructions (Addendum)
Wash this site twice daily with mild soap and water.  Reapply a layer of hydrocortisone cream. You may also take benadryl (or another allergy pill such as claritin or zyrtec) for itch relief. Avoid contact with this product as discussed.  Also make sure it is not in reach of children or pets.

## 2019-06-12 NOTE — ED Notes (Signed)
Awaiting meds from pharm.

## 2019-06-12 NOTE — ED Notes (Addendum)
Call to [facility] poison Control  [phone]  Make sure that she has washed and treat as allergic reaction   Treat symptomatically per [facility]   Pt does not know what brand this rat poison was  She has cleaned and put topical on it to ease itching  Superficial raised reddened areas scattered to upper R forearm and lower R upper arm area near elbow
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
## 2019-06-12 NOTE — ED Triage Notes (Signed)
Patient c/o rash to right forearm that appeared after reaching under couch and coming in contact with green powder chemical. Per patient originally itching but relieved with hydrocortisone cream. Denies any pain/burning.

## 2019-06-12 NOTE — ED Notes (Signed)
Call to pharmacy for med

## 2019-06-29 ENCOUNTER — Encounter: Payer: Self-pay | Admitting: Emergency Medicine

## 2019-06-29 ENCOUNTER — Other Ambulatory Visit: Payer: Self-pay

## 2019-06-29 ENCOUNTER — Emergency Department
Admission: EM | Admit: 2019-06-29 | Discharge: 2019-06-29 | Disposition: A | Discharge status: Home / Self Care | Payer: Medicaid Other | Attending: Student | Admitting: Student

## 2019-06-29 ENCOUNTER — Emergency Department: Payer: Medicaid Other

## 2019-06-29 DIAGNOSIS — R0789 Other chest pain: Secondary | ICD-10-CM | POA: Diagnosis not present

## 2019-06-29 DIAGNOSIS — M25511 Pain in right shoulder: Secondary | ICD-10-CM | POA: Diagnosis not present

## 2019-06-29 DIAGNOSIS — F17210 Nicotine dependence, cigarettes, uncomplicated: Secondary | ICD-10-CM | POA: Insufficient documentation

## 2019-06-29 DIAGNOSIS — Z975 Presence of (intrauterine) contraceptive device: Secondary | ICD-10-CM | POA: Insufficient documentation

## 2019-06-29 DIAGNOSIS — Y999 Unspecified external cause status: Secondary | ICD-10-CM | POA: Diagnosis not present

## 2019-06-29 DIAGNOSIS — Y92410 Unspecified street and highway as the place of occurrence of the external cause: Secondary | ICD-10-CM | POA: Diagnosis not present

## 2019-06-29 DIAGNOSIS — M7918 Myalgia, other site: Secondary | ICD-10-CM

## 2019-06-29 DIAGNOSIS — Y93I9 Activity, other involving external motion: Secondary | ICD-10-CM | POA: Insufficient documentation

## 2019-06-29 DIAGNOSIS — M79604 Pain in right leg: Secondary | ICD-10-CM | POA: Insufficient documentation

## 2019-06-29 MED ORDER — IBUPROFEN 600 MG PO TABS: 600.0000 mg | ORAL_TABLET | Freq: Three times a day (TID) | ORAL | 0 refills | Status: DC | PRN

## 2019-06-29 MED ORDER — TRAMADOL HCL 50 MG PO TABS: 50.0000 mg | ORAL_TABLET | Freq: Four times a day (QID) | ORAL | 0 refills | Status: DC | PRN

## 2019-06-29 MED ORDER — CYCLOBENZAPRINE HCL 10 MG PO TABS: 10.0000 mg | ORAL_TABLET | Freq: Three times a day (TID) | ORAL | 0 refills | Status: DC | PRN
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
## 2019-06-29 NOTE — Discharge Instructions (Signed)
Follow discharge care instruction take medication as directed. °
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
## 2019-07-05 ENCOUNTER — Telehealth: Payer: Self-pay | Admitting: General Practice

## 2019-07-05 NOTE — Telephone Encounter (Signed)
LVM regarding referral from [facility]. Not eligible due to medicaid.

## 2019-07-07 ENCOUNTER — Telehealth: Payer: Self-pay | Admitting: General Practice

## 2019-07-07 NOTE — Telephone Encounter (Signed)
Individual has been contacted regarding ED referral and has been informed that they are ineligible to become a pt due to having Medicaid.

## 2019-07-09 ENCOUNTER — Emergency Department (HOSPITAL_COMMUNITY)
Admission: EM | Admit: 2019-07-09 | Discharge: 2019-07-09 | Disposition: A | Discharge status: Home / Self Care | Payer: Medicaid Other | Attending: Emergency Medicine | Admitting: Emergency Medicine

## 2019-07-09 ENCOUNTER — Emergency Department (HOSPITAL_COMMUNITY): Payer: Medicaid Other

## 2019-07-09 ENCOUNTER — Other Ambulatory Visit: Payer: Self-pay

## 2019-07-09 ENCOUNTER — Encounter (HOSPITAL_COMMUNITY): Payer: Self-pay | Admitting: Emergency Medicine

## 2019-07-09 DIAGNOSIS — F17210 Nicotine dependence, cigarettes, uncomplicated: Secondary | ICD-10-CM | POA: Insufficient documentation

## 2019-07-09 DIAGNOSIS — R1030 Lower abdominal pain, unspecified: Secondary | ICD-10-CM

## 2019-07-09 DIAGNOSIS — R1031 Right lower quadrant pain: Secondary | ICD-10-CM | POA: Diagnosis not present

## 2019-07-09 LAB — URINALYSIS, ROUTINE W REFLEX MICROSCOPIC
Bilirubin Urine: NEGATIVE
Glucose, UA: NEGATIVE mg/dL
Hgb urine dipstick: NEGATIVE
Ketones, ur: NEGATIVE mg/dL
Nitrite: NEGATIVE
Protein, ur: NEGATIVE mg/dL
Specific Gravity, Urine: 1.009 (ref 1.005–1.030)
pH: 6 (ref 5.0–8.0)

## 2019-07-09 LAB — COMPREHENSIVE METABOLIC PANEL
ALT: 16 U/L (ref 0–44)
AST: 17 U/L (ref 15–41)
Albumin: 3.3 g/dL — ABNORMAL LOW (ref 3.5–5.0)
Alkaline Phosphatase: 41 U/L (ref 38–126)
Anion gap: 7 (ref 5–15)
BUN: 12 mg/dL (ref 6–20)
CO2: 24 mmol/L (ref 22–32)
Calcium: 8.6 mg/dL — ABNORMAL LOW (ref 8.9–10.3)
Chloride: 106 mmol/L (ref 98–111)
Creatinine, Ser: 0.62 mg/dL (ref 0.44–1.00)
GFR calc Af Amer: >60 mL/min (ref >60)
GFR calc non Af Amer: >60 mL/min (ref >60)
Glucose, Bld: 89 mg/dL (ref 70–99)
Potassium: 3.8 mmol/L (ref 3.5–5.1)
Sodium: 137 mmol/L (ref 135–145)
Total Bilirubin: 0.4 mg/dL (ref 0.3–1.2)
Total Protein: 6.1 g/dL — ABNORMAL LOW (ref 6.5–8.1)

## 2019-07-09 LAB — CBC
HCT: 38.9 % (ref 36.0–46.0)
Hemoglobin: 12.6 g/dL (ref 12.0–15.0)
MCH: 29.1 pg (ref 26.0–34.0)
MCHC: 32.4 g/dL (ref 30.0–36.0)
MCV: 89.8 fL (ref 80.0–100.0)
Platelets: 150 10*3/uL (ref 150–400)
RBC: 4.33 MIL/uL (ref 3.87–5.11)
RDW: 13.2 % (ref 11.5–15.5)
WBC: 8 10*3/uL (ref 4.0–10.5)
nRBC: 0 % (ref 0.0–0.2)

## 2019-07-09 LAB — LIPASE, BLOOD: Lipase: 33 U/L (ref 11–51)

## 2019-07-09 LAB — PREGNANCY, URINE: Preg Test, Ur: NEGATIVE

## 2019-07-09 LAB — HCG, QUANTITATIVE, PREGNANCY: hCG, Beta Chain, Quant, S: <1 m[IU]/mL (ref <5)

## 2019-07-09 MED ORDER — HYDROMORPHONE HCL 1 MG/ML IJ SOLN
0.5000 mg | Freq: Once | INTRAMUSCULAR | Status: AC
  Administered 2019-07-09: 0.5 mg via INTRAVENOUS
  Filled 2019-07-09: qty 1

## 2019-07-09 MED ORDER — ONDANSETRON HCL 4 MG/2ML IJ SOLN
4.0000 mg | Freq: Once | INTRAMUSCULAR | Status: AC
  Administered 2019-07-09: 4 mg via INTRAVENOUS
  Filled 2019-07-09: qty 2

## 2019-07-09 MED ORDER — IOHEXOL 300 MG/ML  SOLN
100.0000 mL | Freq: Once | INTRAMUSCULAR | Status: AC | PRN
  Administered 2019-07-09: 100 mL via INTRAVENOUS

## 2019-07-09 MED ORDER — SODIUM CHLORIDE 0.9 % IV BOLUS
1000.0000 mL | Freq: Once | INTRAVENOUS | Status: AC
  Administered 2019-07-09: 1000 mL via INTRAVENOUS
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
## 2019-07-09 NOTE — Discharge Instructions (Addendum)
Take Tylenol for pain.  Return tomorrow morning at 6 AM for reexam if not improving.  Definitely return if fever and vomiting,   if you do improved follow-up with your family doctor next week

## 2019-07-09 NOTE — ED Triage Notes (Signed)
Abd pain in epigastric area and mid abd started last night.  Has had some trouble having a bowel movement recently

## 2020-01-07 HISTORY — PX: WISDOM TOOTH EXTRACTION: SHX21

## 2020-03-06 ENCOUNTER — Other Ambulatory Visit: Payer: Self-pay

## 2020-03-06 ENCOUNTER — Encounter: Payer: Self-pay | Admitting: Adult Health

## 2020-03-06 ENCOUNTER — Ambulatory Visit (INDEPENDENT_AMBULATORY_CARE_PROVIDER_SITE_OTHER): Payer: Medicaid Other | Admitting: Adult Health

## 2020-03-06 VITALS — BP 124/72 | HR 90 | Ht 62.0 in | Wt 160.0 lb

## 2020-03-06 DIAGNOSIS — Z8759 Personal history of other complications of pregnancy, childbirth and the puerperium: Secondary | ICD-10-CM

## 2020-03-06 DIAGNOSIS — O3680X0 Pregnancy with inconclusive fetal viability, not applicable or unspecified: Secondary | ICD-10-CM

## 2020-03-06 DIAGNOSIS — N926 Irregular menstruation, unspecified: Secondary | ICD-10-CM

## 2020-03-06 DIAGNOSIS — Z3A01 Less than 8 weeks gestation of pregnancy: Secondary | ICD-10-CM

## 2020-03-06 DIAGNOSIS — Z98891 History of uterine scar from previous surgery: Secondary | ICD-10-CM

## 2020-03-06 DIAGNOSIS — Z8751 Personal history of pre-term labor: Secondary | ICD-10-CM

## 2020-03-06 DIAGNOSIS — Z3201 Encounter for pregnancy test, result positive: Secondary | ICD-10-CM | POA: Diagnosis not present

## 2020-03-06 DIAGNOSIS — F419 Anxiety disorder, unspecified: Secondary | ICD-10-CM

## 2020-03-06 DIAGNOSIS — F32A Depression, unspecified: Secondary | ICD-10-CM

## 2020-03-06 LAB — POCT URINE PREGNANCY: Preg Test, Ur: POSITIVE — AB

## 2020-03-06 MED ORDER — PRENATAL PLUS 27-1 MG PO TABS
1.0000 | ORAL_TABLET | Freq: Every day | ORAL | 12 refills | Status: DC
Start: 2020-03-06 — End: 2020-10-07

## 2020-03-06 MED ORDER — ESCITALOPRAM OXALATE 10 MG PO TABS
10.0000 mg | ORAL_TABLET | Freq: Every day | ORAL | 3 refills | Status: DC
Start: 2020-03-06 — End: 2020-08-09

## 2020-03-06 NOTE — Progress Notes (Signed)
Subjective:     Patient ID: Maria Richmond, female   DOB: 28-Feb-1995, 25 y.o.   MRN: [id number]  HPI Maria Richmond is a 25 year old black female, single, in for UPT has missed a period and had 5+HPTs.  She has had 3  C-sections and with history of preterm labor and has had to get 17P in the past. Last prenatal care at [facility] in [place].   Review of Systems +missed period with 5+HPTs +teary Reviewed past medical,surgical, social and family history. Reviewed medications and allergies.     Objective:   Physical Exam BP 124/72 (BP Location: Left Arm, Patient Position: Sitting, Cuff Size: Normal)   Pulse 90   Ht 5\' 2"  (1.575 m)   Wt 160 lb (72.6 kg)   LMP 01/19/2020   BMI 29.26 kg/m UPTis positive, about 6+5 weeks by LMP with EDD 10/25/20.Skin warm and dry. Neck: mid line trachea, normal thyroid, good ROM, no lymphadenopathy noted. Lungs: clear to ausculation bilaterally. Cardiovascular: regular rate and rhythm. Abdomen is soft and non tender, exam by Modesto Charon NP student. AA is 3 Fall risk is low PHQ 9 score is 17, no SI, and open to meds GAD 7 score is 20  [facility] - 03/06/20 1105      Pregnancy Intention Screening   Does the patient want to become pregnant in the next year? N/A    Does the patient's partner want to become pregnant in the next year? N/A    Would the patient like to discuss contraceptive options today? N/A      Contraception Wrap Up   Current Method Pregnant/Seeking Pregnancy    End Method Pregnant/Seeking Pregnancy    Contraception Counseling Provided No             Assessment:     1. Missed period  2. Positive pregnancy test Will rx PNV  3. Less than [redacted] weeks gestation of pregnancy Decrease smoking   4. Encounter to determine fetal viability of pregnancy, single or unspecified fetus Dating [place] in 2 weeks   5. History of preterm delivery  6. History of placenta abruption  7. History of classical cesarean section  8. Anxiety and  depression Will rx lexapro 10 mg 1 daily  Meds ordered this encounter  Medications  . prenatal vitamin w/FE, FA (PRENATAL 1 + 1) 27-1 MG TABS tablet    Sig: Take 1 tablet by mouth daily at 12 noon.    Dispense:  30 tablet    Refill:  12    Order Specific Question:   Supervising Provider    Answer:   Despina Hidden, LUTHER H [[id number]]  . escitalopram (LEXAPRO) 10 MG tablet    Sig: Take 1 tablet (10 mg total) by mouth daily.    Dispense:  30 tablet    Refill:  3    Order Specific Question:   Supervising Provider    Answer:   Lazaro Arms [[id number]]      Plan:     Review handout by [facility]

## 2020-03-21 ENCOUNTER — Other Ambulatory Visit: Payer: Self-pay

## 2020-03-21 ENCOUNTER — Ambulatory Visit (INDEPENDENT_AMBULATORY_CARE_PROVIDER_SITE_OTHER): Payer: Medicaid Other

## 2020-03-21 DIAGNOSIS — O3680X0 Pregnancy with inconclusive fetal viability, not applicable or unspecified: Secondary | ICD-10-CM | POA: Diagnosis not present

## 2020-03-21 NOTE — Progress Notes (Signed)
[place] 8+6 wks,single IUP with YS,FHR 160 bpm,crl 20.38 mm,subchorionic hemorrhage 2.1 x .5 x 1.6 cm

## 2020-03-23 ENCOUNTER — Encounter: Payer: Medicaid Other | Admitting: Obstetrics and Gynecology

## 2020-04-02 ENCOUNTER — Other Ambulatory Visit: Payer: Self-pay

## 2020-04-02 ENCOUNTER — Emergency Department
Admission: EM | Admit: 2020-04-02 | Discharge: 2020-04-02 | Disposition: A | Discharge status: Home / Self Care | Payer: Medicaid Other | Attending: Emergency Medicine | Admitting: Emergency Medicine

## 2020-04-02 DIAGNOSIS — Z5321 Procedure and treatment not carried out due to patient leaving prior to being seen by health care provider: Secondary | ICD-10-CM | POA: Diagnosis not present

## 2020-04-02 DIAGNOSIS — Z3A10 10 weeks gestation of pregnancy: Secondary | ICD-10-CM | POA: Diagnosis not present

## 2020-04-02 DIAGNOSIS — R519 Headache, unspecified: Secondary | ICD-10-CM | POA: Diagnosis not present

## 2020-04-02 DIAGNOSIS — O218 Other vomiting complicating pregnancy: Secondary | ICD-10-CM | POA: Insufficient documentation

## 2020-04-02 DIAGNOSIS — R197 Diarrhea, unspecified: Secondary | ICD-10-CM | POA: Diagnosis not present

## 2020-04-02 LAB — URINALYSIS, COMPLETE (UACMP) WITH MICROSCOPIC
Bilirubin Urine: NEGATIVE
Glucose, UA: NEGATIVE mg/dL
Hgb urine dipstick: NEGATIVE
Ketones, ur: NEGATIVE mg/dL
Leukocytes,Ua: NEGATIVE
Nitrite: NEGATIVE
Protein, ur: NEGATIVE mg/dL
Specific Gravity, Urine: 1.025 (ref 1.005–1.030)
pH: 6 (ref 5.0–8.0)

## 2020-04-02 LAB — COMPREHENSIVE METABOLIC PANEL
ALT: 19 U/L (ref 0–44)
AST: 20 U/L (ref 15–41)
Albumin: 3.6 g/dL (ref 3.5–5.0)
Alkaline Phosphatase: 41 U/L (ref 38–126)
Anion gap: 7 (ref 5–15)
BUN: 13 mg/dL (ref 6–20)
CO2: 22 mmol/L (ref 22–32)
Calcium: 8.9 mg/dL (ref 8.9–10.3)
Chloride: 105 mmol/L (ref 98–111)
Creatinine, Ser: 0.43 mg/dL — ABNORMAL LOW (ref 0.44–1.00)
GFR, Estimated: >60 mL/min (ref >60)
Glucose, Bld: 96 mg/dL (ref 70–99)
Potassium: 3.6 mmol/L (ref 3.5–5.1)
Sodium: 134 mmol/L — ABNORMAL LOW (ref 135–145)
Total Bilirubin: 0.5 mg/dL (ref 0.3–1.2)
Total Protein: 6.7 g/dL (ref 6.5–8.1)

## 2020-04-02 LAB — CBC
HCT: 35.8 % — ABNORMAL LOW (ref 36.0–46.0)
Hemoglobin: 12.1 g/dL (ref 12.0–15.0)
MCH: 30.1 pg (ref 26.0–34.0)
MCHC: 33.8 g/dL (ref 30.0–36.0)
MCV: 89.1 fL (ref 80.0–100.0)
Platelets: 181 10*3/uL (ref 150–400)
RBC: 4.02 MIL/uL (ref 3.87–5.11)
RDW: 12 % (ref 11.5–15.5)
WBC: 6.2 10*3/uL (ref 4.0–10.5)
nRBC: 0 % (ref 0.0–0.2)

## 2020-04-02 LAB — POC URINE PREG, ED: Preg Test, Ur: POSITIVE — AB

## 2020-04-02 NOTE — ED Triage Notes (Addendum)
Pt in with co n.v.d also co headache. States symptoms started today, is [redacted] weeks pregnant but denies any abd or vaginal bleeding.

## 2020-04-04 ENCOUNTER — Other Ambulatory Visit: Payer: Self-pay

## 2020-04-04 ENCOUNTER — Emergency Department
Admission: EM | Admit: 2020-04-04 | Discharge: 2020-04-04 | Disposition: A | Discharge status: Home / Self Care | Payer: Medicaid Other | Attending: Emergency Medicine | Admitting: Emergency Medicine

## 2020-04-04 DIAGNOSIS — O26891 Other specified pregnancy related conditions, first trimester: Secondary | ICD-10-CM | POA: Insufficient documentation

## 2020-04-04 DIAGNOSIS — R059 Cough, unspecified: Secondary | ICD-10-CM | POA: Insufficient documentation

## 2020-04-04 DIAGNOSIS — K0381 Cracked tooth: Secondary | ICD-10-CM | POA: Insufficient documentation

## 2020-04-04 DIAGNOSIS — Z79899 Other long term (current) drug therapy: Secondary | ICD-10-CM | POA: Diagnosis not present

## 2020-04-04 DIAGNOSIS — O99512 Diseases of the respiratory system complicating pregnancy, second trimester: Secondary | ICD-10-CM | POA: Diagnosis not present

## 2020-04-04 DIAGNOSIS — O99612 Diseases of the digestive system complicating pregnancy, second trimester: Secondary | ICD-10-CM | POA: Diagnosis present

## 2020-04-04 DIAGNOSIS — R0981 Nasal congestion: Secondary | ICD-10-CM | POA: Insufficient documentation

## 2020-04-04 DIAGNOSIS — K0889 Other specified disorders of teeth and supporting structures: Secondary | ICD-10-CM | POA: Diagnosis not present

## 2020-04-04 DIAGNOSIS — O99611 Diseases of the digestive system complicating pregnancy, first trimester: Secondary | ICD-10-CM | POA: Insufficient documentation

## 2020-04-04 DIAGNOSIS — F17210 Nicotine dependence, cigarettes, uncomplicated: Secondary | ICD-10-CM | POA: Insufficient documentation

## 2020-04-04 DIAGNOSIS — Z3A10 10 weeks gestation of pregnancy: Secondary | ICD-10-CM | POA: Diagnosis not present

## 2020-04-04 DIAGNOSIS — O99331 Smoking (tobacco) complicating pregnancy, first trimester: Secondary | ICD-10-CM | POA: Diagnosis not present

## 2020-04-04 DIAGNOSIS — Z333 Pregnant state, gestational carrier: Secondary | ICD-10-CM | POA: Insufficient documentation

## 2020-04-04 DIAGNOSIS — O26892 Other specified pregnancy related conditions, second trimester: Secondary | ICD-10-CM | POA: Insufficient documentation

## 2020-04-04 DIAGNOSIS — Z5320 Procedure and treatment not carried out because of patient's decision for unspecified reasons: Secondary | ICD-10-CM | POA: Insufficient documentation

## 2020-04-04 DIAGNOSIS — O210 Mild hyperemesis gravidarum: Secondary | ICD-10-CM | POA: Diagnosis not present

## 2020-04-04 LAB — CBC WITH DIFFERENTIAL/PLATELET
Abs Immature Granulocytes: 0 10*3/uL (ref 0.00–0.07)
Basophils Absolute: 0 10*3/uL (ref 0.0–0.1)
Basophils Relative: 1 %
Eosinophils Absolute: 0.1 10*3/uL (ref 0.0–0.5)
Eosinophils Relative: 3 %
HCT: 39.4 % (ref 36.0–46.0)
Hemoglobin: 13.5 g/dL (ref 12.0–15.0)
Immature Granulocytes: 0 %
Lymphocytes Relative: 39 %
Lymphs Abs: 1.5 10*3/uL (ref 0.7–4.0)
MCH: 29.7 pg (ref 26.0–34.0)
MCHC: 34.3 g/dL (ref 30.0–36.0)
MCV: 86.8 fL (ref 80.0–100.0)
Monocytes Absolute: 0.6 10*3/uL (ref 0.1–1.0)
Monocytes Relative: 16 %
Neutro Abs: 1.6 10*3/uL — ABNORMAL LOW (ref 1.7–7.7)
Neutrophils Relative %: 41 %
Platelets: 169 10*3/uL (ref 150–400)
RBC: 4.54 MIL/uL (ref 3.87–5.11)
RDW: 12 % (ref 11.5–15.5)
WBC: 3.8 10*3/uL — ABNORMAL LOW (ref 4.0–10.5)
nRBC: 0 % (ref 0.0–0.2)

## 2020-04-04 LAB — COMPREHENSIVE METABOLIC PANEL
ALT: 28 U/L (ref 0–44)
AST: 28 U/L (ref 15–41)
Albumin: 4 g/dL (ref 3.5–5.0)
Alkaline Phosphatase: 41 U/L (ref 38–126)
Anion gap: 9 (ref 5–15)
BUN: 9 mg/dL (ref 6–20)
CO2: 23 mmol/L (ref 22–32)
Calcium: 9.2 mg/dL (ref 8.9–10.3)
Chloride: 102 mmol/L (ref 98–111)
Creatinine, Ser: 0.42 mg/dL — ABNORMAL LOW (ref 0.44–1.00)
GFR, Estimated: >60 mL/min (ref >60)
Glucose, Bld: 97 mg/dL (ref 70–99)
Potassium: 3.6 mmol/L (ref 3.5–5.1)
Sodium: 134 mmol/L — ABNORMAL LOW (ref 135–145)
Total Bilirubin: 0.5 mg/dL (ref 0.3–1.2)
Total Protein: 7.8 g/dL (ref 6.5–8.1)

## 2020-04-04 LAB — URINALYSIS, COMPLETE (UACMP) WITH MICROSCOPIC
Bilirubin Urine: NEGATIVE
Glucose, UA: NEGATIVE mg/dL
Hgb urine dipstick: NEGATIVE
Ketones, ur: 5 mg/dL — AB
Leukocytes,Ua: NEGATIVE
Nitrite: NEGATIVE
Protein, ur: NEGATIVE mg/dL
Specific Gravity, Urine: 1.024 (ref 1.005–1.030)
pH: 6 (ref 5.0–8.0)

## 2020-04-04 MED ORDER — ACETAMINOPHEN 160 MG/5ML PO SUSP
ORAL | Status: AC
  Administered 2020-04-04: 650 mg via ORAL
  Filled 2020-04-04: qty 25

## 2020-04-04 MED ORDER — SODIUM CHLORIDE 0.9 % IV BOLUS
1000.0000 mL | Freq: Once | INTRAVENOUS | Status: AC
  Administered 2020-04-04: 1000 mL via INTRAVENOUS

## 2020-04-04 MED ORDER — LIDOCAINE VISCOUS HCL 2 % MT SOLN
15.0000 mL | Freq: Once | OROMUCOSAL | Status: AC
  Administered 2020-04-04: 15 mL via OROMUCOSAL
  Filled 2020-04-04: qty 15

## 2020-04-04 MED ORDER — ONDANSETRON HCL 4 MG/2ML IJ SOLN
4.0000 mg | Freq: Once | INTRAMUSCULAR | Status: AC
  Administered 2020-04-04: 4 mg via INTRAVENOUS
  Filled 2020-04-04: qty 2

## 2020-04-04 MED ORDER — ACETAMINOPHEN 325 MG PO TABS
650.0000 mg | ORAL_TABLET | Freq: Once | ORAL | Status: DC
  Filled 2020-04-04: qty 2

## 2020-04-04 MED ORDER — ONDANSETRON 4 MG PO TBDP
4.0000 mg | ORAL_TABLET | Freq: Three times a day (TID) | ORAL | 0 refills | Status: DC | PRN
Start: 2020-04-04 — End: 2020-08-09

## 2020-04-04 MED ORDER — LIDOCAINE VISCOUS HCL 2 % MT SOLN
15.0000 mL | OROMUCOSAL | 0 refills | Status: DC | PRN
Start: 2020-04-04 — End: 2020-08-09

## 2020-04-04 MED ORDER — ACETAMINOPHEN 160 MG/5ML PO SOLN
650.0000 mg | Freq: Once | ORAL | Status: AC
  Administered 2020-04-04: 650 mg via ORAL
  Filled 2020-04-04: qty 20.3

## 2020-04-04 NOTE — ED Triage Notes (Addendum)
Pt arrives POV with CC of dental pain from wisdom tooth (upper left) that is unable to be removed due to pt being [redacted] weeks pregnant. Pt stating dental pain has been present for "a couple of months". Pt also reporting runny nose, chills, back aches, and dry cough. Pt ambulatory and in NAD at this time.
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
## 2020-04-04 NOTE — Discharge Instructions (Signed)
Use viscous lidocaine and Tylenol for treatment of dental pain.  Please follow-up with the dental clinic as below.  Please use Zofran only as needed, follow-up with your primary doctor taking care of your pregnancy in regards to this.  OPTIONS FOR DENTAL FOLLOW UP CARE  [place] Department of Health and Human Services - Local Safety Net Dental Clinics [email or web address]   [facility] [phone])  [facility] [phone])  [place] [phone] ext [phone])  [facility] [phone])  [facility] [phone]) This clinic caters to the indigent population and is on a lottery system. Location: [employer] [facility] [facility], [employer], [address], [place] Clinic Hours: Wednesdays from 6pm - 9pm, patients seen by a lottery system. For dates, call or go to [email or web address] Services: Cleanings, fillings and simple extractions. Payment Options: DENTAL WORK IS FREE OF CHARGE. Bring proof of income or support. Best way to get seen: Arrive at 5:15 pm - this is a lottery, NOT first come/first serve, so arriving earlier will not increase your chances of being seen.     [facility] [phone] Select option 1 for emergencies   Location: [facility], [place], [address], [place] Clinic Hours: No walk-ins accepted - call the day before to schedule an appointment. Check in times are 9:30 am and 1:30 pm. Services: Simple extractions, temporary fillings, pulpectomy/pulp debridement, uncomplicated abscess drainage. Payment Options: PAYMENT IS DUE AT THE TIME OF SERVICE.  Fee is usually $100-200, additional surgical procedures (e.g. abscess drainage) may be extra. Cash, checks, Visa/MasterCard accepted.  Can file Medicaid if patient is covered for dental - patient should call case worker to check. No  discount for [facility] patients. Best way to get seen: MUST call the day before and get onto the schedule. Can usually be seen the next 1-2 days. No walk-ins accepted.     [facility] [phone]   Location: [facility], [address] [place] Clinic Hours: M, W, Th, F 8am or 1:30pm, Tues 9a or 1:30 - first come/first served. Services: Simple extractions, temporary fillings, uncomplicated abscess drainage.  You do not need to be an [facility] resident. Payment Options: PAYMENT IS DUE AT THE TIME OF SERVICE. Dental insurance, otherwise sliding scale - bring proof of income or support. Depending on income and treatment needed, cost is usually $50-200. Best way to get seen: Arrive early as it is first come/first served.     [facility] [phone]   Location: [address] Clinic Hours: Mon-Thu 8a-5p Services: Most basic dental services including extractions and fillings. Payment Options: PAYMENT IS DUE AT THE TIME OF SERVICE. Sliding scale, up to 50% off - bring proof if income or support. Medicaid with dental option accepted. Best way to get seen: Call to schedule an appointment, can usually be seen within 2 weeks OR they will try to see walk-ins - show up at 8a or 2p (you may have to wait).     [facility] [phone] [place] RESIDENTS ONLY   Location: [facility], [address] [place], [place] [address] Clinic Hours: By appointment only. Monday - Thursday 8am-5pm, Friday 8am-12pm Services: Cleanings, fillings, extractions. Payment Options: PAYMENT IS DUE AT THE TIME OF SERVICE. Cash, Visa or MasterCard. Sliding scale - $30 minimum per service. Best way to get seen: Come in to office, complete packet and make an appointment -  need proof of income or support monies for each household member and proof of [place]  residence. Usually takes about a month to get in.     [facility] [phone]   Location: [address] [place] Clinic Hours: Walk-in Urgent Care Dental Services are offered Monday-Friday mornings only. The numbers of emergencies accepted daily is limited to the number of providers available. Maximum 15 - Mondays, Wednesdays & Thursdays Maximum 10 - Tuesdays & Fridays Services: You do not need to be a [place] resident to be seen for a dental emergency. Emergencies are defined as pain, swelling, abnormal bleeding, or dental trauma. Walkins will receive x-rays if needed. NOTE: Dental cleaning is not an emergency. Payment Options: PAYMENT IS DUE AT THE TIME OF SERVICE. Minimum co-pay is $40.00 for uninsured patients. Minimum co-pay is $3.00 for Medicaid with dental coverage. Dental Insurance is accepted and must be presented at time of visit. Medicare does not cover dental. Forms of payment: Cash, credit card, checks. Best way to get seen: If not previously registered with the clinic, walk-in dental registration begins at 7:15 am and is on a first come/first serve basis. If previously registered with the clinic, call to make an appointment.     [facility] [phone] [place] RESIDENTS ONLY   Location: [address], [place], [place] Clinic Hours: Mon-Thu 10a-2p Services: Extractions only! Payment Options: FREE (donations accepted) - bring proof of income or support Best way to get seen: Call and schedule an appointment OR come at 8am on the 1st Monday of every month (except for holidays) when it is first come/first served.     [facility] [phone]   Location: [address], [place] Clinic Hours: Friday mornings Services, Payment Options, Best way to get seen: Call for info  

## 2020-04-04 NOTE — ED Notes (Signed)
Triage documentation completed by Burke Keels reviewed and approved by this RN

## 2020-04-05 ENCOUNTER — Emergency Department
Admission: EM | Admit: 2020-04-05 | Discharge: 2020-04-05 | Disposition: A | Discharge status: Home / Self Care | Payer: Medicaid Other | Attending: Emergency Medicine | Admitting: Emergency Medicine

## 2020-04-05 ENCOUNTER — Other Ambulatory Visit: Payer: Self-pay

## 2020-04-05 ENCOUNTER — Encounter: Payer: Self-pay | Admitting: Emergency Medicine

## 2020-04-05 DIAGNOSIS — K0889 Other specified disorders of teeth and supporting structures: Secondary | ICD-10-CM | POA: Diagnosis present

## 2020-04-05 DIAGNOSIS — Z5321 Procedure and treatment not carried out due to patient leaving prior to being seen by health care provider: Secondary | ICD-10-CM | POA: Diagnosis not present

## 2020-04-05 NOTE — ED Triage Notes (Signed)
Pt in via POV, reports dental pain to left upper wisdom tooth, intermittent x 2 months.  States unable to eat in 4 days due to pain.  NAD noted at this time.

## 2020-04-05 NOTE — ED Notes (Signed)
Pt not in room.  Pt left without being seen.

## 2020-04-05 NOTE — ED Notes (Signed)
Pt reports she is [redacted] weeks pregnant.  Pt is having left upper wisdom tooth pain and states her tooth broke months ago.  Pt also reports nausea.  No abd pain   No vag bleeding.  Pt alert  Speech clear

## 2020-04-25 ENCOUNTER — Other Ambulatory Visit (HOSPITAL_COMMUNITY)
Admission: RE | Admit: 2020-04-25 | Discharge: 2020-04-25 | Disposition: A | Discharge status: Home / Self Care | Payer: Medicaid Other | Source: Ambulatory Visit | Attending: Obstetrics and Gynecology | Admitting: Obstetrics and Gynecology

## 2020-04-25 ENCOUNTER — Other Ambulatory Visit: Payer: Self-pay

## 2020-04-25 ENCOUNTER — Ambulatory Visit (INDEPENDENT_AMBULATORY_CARE_PROVIDER_SITE_OTHER): Payer: Medicaid Other | Admitting: Obstetrics and Gynecology

## 2020-04-25 VITALS — BP 118/72 | HR 98 | Wt 164.0 lb

## 2020-04-25 DIAGNOSIS — Z113 Encounter for screening for infections with a predominantly sexual mode of transmission: Secondary | ICD-10-CM | POA: Diagnosis present

## 2020-04-25 DIAGNOSIS — Z7185 Encounter for immunization safety counseling: Secondary | ICD-10-CM

## 2020-04-25 DIAGNOSIS — Z124 Encounter for screening for malignant neoplasm of cervix: Secondary | ICD-10-CM | POA: Insufficient documentation

## 2020-04-25 DIAGNOSIS — Z1379 Encounter for other screening for genetic and chromosomal anomalies: Secondary | ICD-10-CM

## 2020-04-25 DIAGNOSIS — O99330 Smoking (tobacco) complicating pregnancy, unspecified trimester: Secondary | ICD-10-CM | POA: Insufficient documentation

## 2020-04-25 DIAGNOSIS — Z98891 History of uterine scar from previous surgery: Secondary | ICD-10-CM

## 2020-04-25 DIAGNOSIS — Z3A13 13 weeks gestation of pregnancy: Secondary | ICD-10-CM

## 2020-04-25 DIAGNOSIS — Z3689 Encounter for other specified antenatal screening: Secondary | ICD-10-CM

## 2020-04-25 DIAGNOSIS — Z0283 Encounter for blood-alcohol and blood-drug test: Secondary | ICD-10-CM

## 2020-04-25 DIAGNOSIS — F1290 Cannabis use, unspecified, uncomplicated: Secondary | ICD-10-CM

## 2020-04-25 DIAGNOSIS — O0993 Supervision of high risk pregnancy, unspecified, third trimester: Secondary | ICD-10-CM | POA: Insufficient documentation

## 2020-04-25 DIAGNOSIS — F32A Depression, unspecified: Secondary | ICD-10-CM

## 2020-04-25 DIAGNOSIS — O0992 Supervision of high risk pregnancy, unspecified, second trimester: Secondary | ICD-10-CM

## 2020-04-25 DIAGNOSIS — Z8759 Personal history of other complications of pregnancy, childbirth and the puerperium: Secondary | ICD-10-CM

## 2020-04-25 DIAGNOSIS — O99332 Smoking (tobacco) complicating pregnancy, second trimester: Secondary | ICD-10-CM

## 2020-04-25 DIAGNOSIS — Z8751 Personal history of pre-term labor: Secondary | ICD-10-CM

## 2020-04-25 DIAGNOSIS — O281 Abnormal biochemical finding on antenatal screening of mother: Secondary | ICD-10-CM

## 2020-04-25 DIAGNOSIS — F419 Anxiety disorder, unspecified: Secondary | ICD-10-CM

## 2020-04-25 DIAGNOSIS — Z3149 Encounter for other procreative investigation and testing: Secondary | ICD-10-CM

## 2020-04-25 MED ORDER — ESCITALOPRAM OXALATE 10 MG PO TABS: 10.0000 mg | ORAL_TABLET | Freq: Every day | ORAL | 3 refills | Status: DC
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
## 2020-04-26 LAB — CYTOLOGY - PAP
Chlamydia: NEGATIVE
Comment: NEGATIVE
Comment: NEGATIVE
Comment: NORMAL
Diagnosis: NEGATIVE
Neisseria Gonorrhea: NEGATIVE
Trichomonas: NEGATIVE

## 2020-05-11 ENCOUNTER — Other Ambulatory Visit: Payer: Self-pay

## 2020-05-11 ENCOUNTER — Encounter: Payer: Self-pay | Admitting: Obstetrics and Gynecology

## 2020-05-11 ENCOUNTER — Ambulatory Visit (INDEPENDENT_AMBULATORY_CARE_PROVIDER_SITE_OTHER): Payer: Medicaid Other | Admitting: Obstetrics and Gynecology

## 2020-05-11 VITALS — BP 122/74 | Wt 167.0 lb

## 2020-05-11 DIAGNOSIS — O0992 Supervision of high risk pregnancy, unspecified, second trimester: Secondary | ICD-10-CM

## 2020-05-11 DIAGNOSIS — Z8759 Personal history of other complications of pregnancy, childbirth and the puerperium: Secondary | ICD-10-CM

## 2020-05-11 DIAGNOSIS — Z3A16 16 weeks gestation of pregnancy: Secondary | ICD-10-CM

## 2020-05-11 DIAGNOSIS — Z3149 Encounter for other procreative investigation and testing: Secondary | ICD-10-CM

## 2020-05-11 DIAGNOSIS — Z1379 Encounter for other screening for genetic and chromosomal anomalies: Secondary | ICD-10-CM

## 2020-05-11 DIAGNOSIS — Z98891 History of uterine scar from previous surgery: Secondary | ICD-10-CM

## 2020-05-11 DIAGNOSIS — F1290 Cannabis use, unspecified, uncomplicated: Secondary | ICD-10-CM

## 2020-05-11 DIAGNOSIS — Z8751 Personal history of pre-term labor: Secondary | ICD-10-CM

## 2020-05-11 MED ORDER — HYDROXYPROGESTERONE CAPROATE 250 MG/ML IM OIL: 250.0000 mg | TOPICAL_OIL | Freq: Once | INTRAMUSCULAR | 20 refills | Status: DC
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
## 2020-05-12 LAB — RPR+RH+ABO+RUB AB+AB SCR+CB...
Antibody Screen: NEGATIVE
HIV Screen 4th Generation wRfx: NONREACTIVE
Hematocrit: 34.9 % (ref 34.0–46.6)
Hemoglobin: 12 g/dL (ref 11.1–15.9)
Hepatitis B Surface Ag: NEGATIVE
MCH: 30 pg (ref 26.6–33.0)
MCHC: 34.4 g/dL (ref 31.5–35.7)
MCV: 87 fL (ref 79–97)
Platelets: 171 10*3/uL (ref 150–450)
RBC: 4 x10E6/uL (ref 3.77–5.28)
RDW: 12.5 % (ref 11.7–15.4)
RPR Ser Ql: NONREACTIVE
Rh Factor: POSITIVE
Rubella Antibodies, IGG: <0.9 index — ABNORMAL LOW (ref >0.99)
Varicella zoster IgG: <135 index — ABNORMAL LOW (ref >165)
WBC: 6.6 10*3/uL (ref 3.4–10.8)

## 2020-05-14 ENCOUNTER — Ambulatory Visit: Payer: Self-pay

## 2020-05-15 ENCOUNTER — Emergency Department
Admission: EM | Admit: 2020-05-15 | Discharge: 2020-05-15 | Disposition: A | Discharge status: Home / Self Care | Payer: Medicaid Other | Attending: Emergency Medicine | Admitting: Emergency Medicine

## 2020-05-15 ENCOUNTER — Other Ambulatory Visit: Payer: Self-pay

## 2020-05-15 DIAGNOSIS — F17210 Nicotine dependence, cigarettes, uncomplicated: Secondary | ICD-10-CM | POA: Diagnosis not present

## 2020-05-15 DIAGNOSIS — K0889 Other specified disorders of teeth and supporting structures: Secondary | ICD-10-CM | POA: Insufficient documentation

## 2020-05-15 DIAGNOSIS — O26891 Other specified pregnancy related conditions, first trimester: Secondary | ICD-10-CM | POA: Insufficient documentation

## 2020-05-15 DIAGNOSIS — Z3A12 12 weeks gestation of pregnancy: Secondary | ICD-10-CM | POA: Diagnosis not present

## 2020-05-15 MED ORDER — AMOXICILLIN 875 MG PO TABS: 875.0000 mg | ORAL_TABLET | Freq: Two times a day (BID) | ORAL | 0 refills | Status: DC

## 2020-05-15 MED ORDER — LIDOCAINE VISCOUS HCL 2 % MT SOLN
15.0000 mL | Freq: Once | OROMUCOSAL | Status: AC
  Administered 2020-05-15: 15 mL via OROMUCOSAL
  Filled 2020-05-15: qty 15

## 2020-05-15 MED ORDER — LIDOCAINE VISCOUS HCL 2 % MT SOLN: 5.0000 mL | Freq: Four times a day (QID) | OROMUCOSAL | 0 refills | Status: DC | PRN

## 2020-05-15 MED ORDER — ACETAMINOPHEN 325 MG PO TABS
650.0000 mg | ORAL_TABLET | Freq: Once | ORAL | Status: AC
  Administered 2020-05-15: 650 mg via ORAL
  Filled 2020-05-15: qty 2

## 2020-05-15 NOTE — ED Triage Notes (Signed)
Pt arrives via pov, ambulatory to triage. C/o left upper dental pain starting several weeks ago, increasing last night. States supposed to be pulled next week on 20th. Pt currently 3 months pregnant. NAD noted at this time. No obvious facial swelling noted

## 2020-05-15 NOTE — ED Notes (Signed)
See triage note  Presents with dental pain possible abscess area  States she is scheduled for dental extraction this month
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
## 2020-05-15 NOTE — Discharge Instructions (Signed)
Follow discharge care instruction take medication as directed.Advise extra strength Tylenol for pain.  Follow-up with scheduled dental appointment

## 2020-05-16 ENCOUNTER — Ambulatory Visit: Payer: Self-pay

## 2020-05-18 LAB — MATERNIT 21 PLUS CORE, BLOOD
Fetal Fraction: 10
Result (T21): NEGATIVE
Trisomy 13 (Patau syndrome): NEGATIVE
Trisomy 18 (Edwards syndrome): NEGATIVE
Trisomy 21 (Down syndrome): NEGATIVE

## 2020-05-22 ENCOUNTER — Ambulatory Visit (INDEPENDENT_AMBULATORY_CARE_PROVIDER_SITE_OTHER): Payer: Medicaid Other

## 2020-05-22 ENCOUNTER — Other Ambulatory Visit: Payer: Self-pay

## 2020-05-22 DIAGNOSIS — Z8751 Personal history of pre-term labor: Secondary | ICD-10-CM

## 2020-05-22 MED ORDER — HYDROXYPROGESTERONE CAPROATE 250 MG/ML IM OIL
250.0000 mg | TOPICAL_OIL | Freq: Once | INTRAMUSCULAR | Status: AC
  Administered 2020-05-22: 250 mg via INTRAMUSCULAR

## 2020-05-23 ENCOUNTER — Encounter: Payer: Medicaid Other | Admitting: Advanced Practice Midwife

## 2020-05-23 ENCOUNTER — Ambulatory Visit: Payer: Medicaid Other | Admitting: *Deleted

## 2020-05-23 ENCOUNTER — Other Ambulatory Visit: Payer: Medicaid Other

## 2020-05-23 LAB — INHERITEST CORE(CF97,SMA,FRAX)

## 2020-05-29 ENCOUNTER — Ambulatory Visit: Payer: Medicaid Other

## 2020-06-06 ENCOUNTER — Other Ambulatory Visit: Payer: Self-pay

## 2020-06-06 ENCOUNTER — Ambulatory Visit (INDEPENDENT_AMBULATORY_CARE_PROVIDER_SITE_OTHER): Payer: Medicaid Other

## 2020-06-06 DIAGNOSIS — Z8751 Personal history of pre-term labor: Secondary | ICD-10-CM | POA: Diagnosis not present

## 2020-06-06 MED ORDER — HYDROXYPROGESTERONE CAPROATE 250 MG/ML IM OIL
250.0000 mg | TOPICAL_OIL | Freq: Once | INTRAMUSCULAR | Status: AC
  Administered 2020-06-06: 250 mg via INTRAMUSCULAR

## 2020-06-06 NOTE — Progress Notes (Signed)
Pt here for her hydroxyprogesterone 250mg  inj which was given IM right glut.  NDC# [phone]  Pt c/o being very itchy at last inj site; adv to apply benadryl cream and d/w provider at next appointment.

## 2020-06-13 ENCOUNTER — Ambulatory Visit: Payer: Medicaid Other

## 2020-06-15 ENCOUNTER — Ambulatory Visit
Admission: RE | Admit: 2020-06-15 | Discharge: 2020-06-15 | Disposition: A | Discharge status: Home / Self Care | Payer: Medicaid Other | Source: Ambulatory Visit | Attending: Obstetrics and Gynecology | Admitting: Obstetrics and Gynecology

## 2020-06-15 ENCOUNTER — Other Ambulatory Visit: Payer: Self-pay

## 2020-06-15 DIAGNOSIS — Z3689 Encounter for other specified antenatal screening: Secondary | ICD-10-CM | POA: Diagnosis not present

## 2020-06-18 ENCOUNTER — Encounter: Payer: Medicaid Other | Admitting: Obstetrics and Gynecology

## 2020-06-27 ENCOUNTER — Encounter: Payer: Medicaid Other | Admitting: Obstetrics and Gynecology

## 2020-07-03 ENCOUNTER — Ambulatory Visit (INDEPENDENT_AMBULATORY_CARE_PROVIDER_SITE_OTHER): Payer: Medicaid Other | Admitting: Obstetrics and Gynecology

## 2020-07-03 ENCOUNTER — Telehealth: Payer: Self-pay

## 2020-07-03 ENCOUNTER — Encounter: Payer: Self-pay | Admitting: Obstetrics and Gynecology

## 2020-07-03 ENCOUNTER — Other Ambulatory Visit: Payer: Self-pay

## 2020-07-03 VITALS — BP 112/70 | Ht 63.0 in | Wt 177.6 lb

## 2020-07-03 DIAGNOSIS — O09212 Supervision of pregnancy with history of pre-term labor, second trimester: Secondary | ICD-10-CM

## 2020-07-03 DIAGNOSIS — O0992 Supervision of high risk pregnancy, unspecified, second trimester: Secondary | ICD-10-CM | POA: Diagnosis not present

## 2020-07-03 DIAGNOSIS — Z3A23 23 weeks gestation of pregnancy: Secondary | ICD-10-CM

## 2020-07-03 DIAGNOSIS — O09292 Supervision of pregnancy with other poor reproductive or obstetric history, second trimester: Secondary | ICD-10-CM

## 2020-07-03 MED ORDER — RHO D IMMUNE GLOBULIN 1500 UNIT/2ML IJ SOSY
300.0000 ug | PREFILLED_SYRINGE | Freq: Once | INTRAMUSCULAR | Status: AC
  Administered 2020-07-03: 300 ug via INTRAMUSCULAR

## 2020-07-03 MED ORDER — HYDROXYPROGESTERONE CAPROATE 275 MG/1.1ML ~~LOC~~ SOAJ
275.0000 mg | Freq: Once | SUBCUTANEOUS | Status: AC
  Administered 2020-07-03: 275 mg via SUBCUTANEOUS
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
## 2020-07-03 NOTE — Patient Instructions (Signed)

## 2020-07-03 NOTE — Telephone Encounter (Signed)
Pt calling; wants documentation of the shot she was given today before the 17P shot..  [phone]

## 2020-07-04 ENCOUNTER — Emergency Department
Admission: EM | Admit: 2020-07-04 | Discharge: 2020-07-04 | Disposition: A | Discharge status: Home / Self Care | Payer: Medicaid Other | Attending: Emergency Medicine | Admitting: Emergency Medicine

## 2020-07-04 ENCOUNTER — Other Ambulatory Visit: Payer: Self-pay

## 2020-07-04 DIAGNOSIS — M791 Myalgia, unspecified site: Secondary | ICD-10-CM | POA: Diagnosis not present

## 2020-07-04 DIAGNOSIS — R42 Dizziness and giddiness: Secondary | ICD-10-CM | POA: Insufficient documentation

## 2020-07-04 DIAGNOSIS — Z5321 Procedure and treatment not carried out due to patient leaving prior to being seen by health care provider: Secondary | ICD-10-CM | POA: Insufficient documentation

## 2020-07-04 DIAGNOSIS — Z3A23 23 weeks gestation of pregnancy: Secondary | ICD-10-CM | POA: Insufficient documentation

## 2020-07-04 DIAGNOSIS — O219 Vomiting of pregnancy, unspecified: Secondary | ICD-10-CM | POA: Insufficient documentation

## 2020-07-04 LAB — CBC
HCT: 34.7 % — ABNORMAL LOW (ref 36.0–46.0)
Hemoglobin: 12.1 g/dL (ref 12.0–15.0)
MCH: 31.1 pg (ref 26.0–34.0)
MCHC: 34.9 g/dL (ref 30.0–36.0)
MCV: 89.2 fL (ref 80.0–100.0)
Platelets: 165 10*3/uL (ref 150–400)
RBC: 3.89 MIL/uL (ref 3.87–5.11)
RDW: 13.1 % (ref 11.5–15.5)
WBC: 7.3 10*3/uL (ref 4.0–10.5)
nRBC: 0 % (ref 0.0–0.2)

## 2020-07-04 LAB — BASIC METABOLIC PANEL
Anion gap: 5 (ref 5–15)
BUN: 9 mg/dL (ref 6–20)
CO2: 24 mmol/L (ref 22–32)
Calcium: 9 mg/dL (ref 8.9–10.3)
Chloride: 104 mmol/L (ref 98–111)
Creatinine, Ser: 0.42 mg/dL — ABNORMAL LOW (ref 0.44–1.00)
GFR, Estimated: >60 mL/min (ref >60)
Glucose, Bld: 80 mg/dL (ref 70–99)
Potassium: 3.9 mmol/L (ref 3.5–5.1)
Sodium: 133 mmol/L — ABNORMAL LOW (ref 135–145)

## 2020-07-04 LAB — URINALYSIS, COMPLETE (UACMP) WITH MICROSCOPIC
Bilirubin Urine: NEGATIVE
Glucose, UA: NEGATIVE mg/dL
Hgb urine dipstick: NEGATIVE
Ketones, ur: NEGATIVE mg/dL
Leukocytes,Ua: NEGATIVE
Nitrite: NEGATIVE
Protein, ur: NEGATIVE mg/dL
Specific Gravity, Urine: 1.014 (ref 1.005–1.030)
pH: 7 (ref 5.0–8.0)

## 2020-07-04 NOTE — Telephone Encounter (Signed)
Notified L&D.

## 2020-07-04 NOTE — Telephone Encounter (Addendum)
Maria Richmond, mom, states patient is very ill. She began vomitting 15 minutes ago. She's vomitted 3x and is still vomitting. States she is having pain in her right side (abdomen/side) reports she is taking her to [facility]. Advised will notify L&D.

## 2020-07-04 NOTE — ED Notes (Signed)
Clarification from Midwife, Claris Che, that the patient should have no side effects from the Rhogam injection. However, the patient called their clinic this morning stating she was vomiting and they informed her to go to the hospital for IVF for the vomiting.  Pt updated on the situation and patient is now agreeing to be seen by EDP and to have fetal heart tones checked in the ED.

## 2020-07-04 NOTE — ED Notes (Addendum)
Pt came into the ED sent by [facility] d/t being given Rhogam injection yesterday by accident.  [facility] sent the patient to be evaluated d/t having side effects.  First RN called L&D and per their midwife, they are aware of the situation and and she does not need to be evaluated upstairs for an obstetric problem, but instead needs to have IV fluids.  Pt brought into triage, but patient refusing to be evaluated by ED team.  Pt cursing and upset at this time and telling staff to "shut up".  First RN called L&D to inform them of patient decision and to see if they would like to consult in on plan of care for patient.

## 2020-07-04 NOTE — Telephone Encounter (Signed)
Maria Richmond patient mom calling stating patient is still in ED. Has been there for 1 hour and hasn't been seen. States this doesn't look good on [place] and she's thinking about contacting a Clinical research associate. She doesn't know if her grand baby is dead or alive. Advised I would contact ED to alert patient condition.

## 2020-07-04 NOTE — ED Notes (Signed)
Pt decided that she does not want to wait any further and has decided to leave the facility.  Pt ambulatory upon leaving facility.

## 2020-07-04 NOTE — Telephone Encounter (Signed)
Spoke w/ED nurse. She is aware & was advised by Paula Compton, CNM that patient was to be treated for N/V in ED. Patient is now refusing to be seen.

## 2020-07-04 NOTE — ED Triage Notes (Signed)
See first nurse note, pt was given rhogam yesterday by med error. Pt reports emesis, body aches and dizziness.  Pt in NAD  Pt is 23 weeks 6 days pregnant

## 2020-07-11 ENCOUNTER — Ambulatory Visit: Payer: Medicaid Other

## 2020-07-18 ENCOUNTER — Ambulatory Visit: Payer: Medicaid Other

## 2020-07-25 ENCOUNTER — Ambulatory Visit: Payer: Medicaid Other

## 2020-07-28 ENCOUNTER — Encounter: Payer: Self-pay | Admitting: Obstetrics & Gynecology

## 2020-07-28 ENCOUNTER — Observation Stay
Admission: EM | Admit: 2020-07-28 | Discharge: 2020-07-28 | Disposition: A | Discharge status: Home / Self Care | Payer: Medicaid Other | Attending: Obstetrics & Gynecology | Admitting: Obstetrics & Gynecology

## 2020-07-28 ENCOUNTER — Telehealth: Payer: Medicaid Other | Admitting: Nurse Practitioner

## 2020-07-28 ENCOUNTER — Other Ambulatory Visit: Payer: Self-pay

## 2020-07-28 DIAGNOSIS — Z8751 Personal history of pre-term labor: Secondary | ICD-10-CM

## 2020-07-28 DIAGNOSIS — Z3A27 27 weeks gestation of pregnancy: Secondary | ICD-10-CM | POA: Diagnosis not present

## 2020-07-28 DIAGNOSIS — O99333 Smoking (tobacco) complicating pregnancy, third trimester: Secondary | ICD-10-CM | POA: Diagnosis not present

## 2020-07-28 DIAGNOSIS — F1290 Cannabis use, unspecified, uncomplicated: Secondary | ICD-10-CM

## 2020-07-28 DIAGNOSIS — R1030 Lower abdominal pain, unspecified: Secondary | ICD-10-CM | POA: Diagnosis not present

## 2020-07-28 DIAGNOSIS — O26892 Other specified pregnancy related conditions, second trimester: Secondary | ICD-10-CM | POA: Diagnosis not present

## 2020-07-28 DIAGNOSIS — F17200 Nicotine dependence, unspecified, uncomplicated: Secondary | ICD-10-CM | POA: Insufficient documentation

## 2020-07-28 DIAGNOSIS — Z8759 Personal history of other complications of pregnancy, childbirth and the puerperium: Secondary | ICD-10-CM

## 2020-07-28 DIAGNOSIS — O0992 Supervision of high risk pregnancy, unspecified, second trimester: Secondary | ICD-10-CM

## 2020-07-28 DIAGNOSIS — Z98891 History of uterine scar from previous surgery: Secondary | ICD-10-CM

## 2020-07-28 DIAGNOSIS — R1032 Left lower quadrant pain: Secondary | ICD-10-CM | POA: Diagnosis present

## 2020-07-28 DIAGNOSIS — O26899 Other specified pregnancy related conditions, unspecified trimester: Secondary | ICD-10-CM | POA: Diagnosis present

## 2020-07-28 LAB — URINALYSIS, COMPLETE (UACMP) WITH MICROSCOPIC
Bilirubin Urine: NEGATIVE
Glucose, UA: NEGATIVE mg/dL
Hgb urine dipstick: NEGATIVE
Ketones, ur: 5 mg/dL — AB
Leukocytes,Ua: NEGATIVE
Nitrite: NEGATIVE
Protein, ur: NEGATIVE mg/dL
Specific Gravity, Urine: 1.016 (ref 1.005–1.030)
pH: 7 (ref 5.0–8.0)

## 2020-07-28 LAB — RUPTURE OF MEMBRANE (ROM)PLUS: Rom Plus: NEGATIVE

## 2020-07-28 LAB — FETAL FIBRONECTIN: Fetal Fibronectin: NEGATIVE

## 2020-07-28 MED ORDER — ACETAMINOPHEN 325 MG PO TABS: 650.0000 mg | ORAL_TABLET | ORAL | Status: DC | PRN

## 2020-07-28 MED ORDER — ONDANSETRON HCL 4 MG/2ML IJ SOLN: 4.0000 mg | Freq: Four times a day (QID) | INTRAMUSCULAR | Status: DC | PRN

## 2020-07-28 NOTE — OB Triage Note (Signed)
Pt is a 25y/o [id number] at [redacted]w[redacted]d with c/o SROM at 1900 and contractions every 3 minutes that also began at 1900. Pt is a previous c/sx2. Pt states +FM. Pt denies VB. Monitors applied and assessing. Initial FHT 155.

## 2020-07-28 NOTE — OB Triage Note (Signed)
Pt to be d/c home and to self care. Pt given teaching on when to seek medical attention (LOF, VB and decreased FM, etc..). Pt verbalized understanding of d/c instructions.   ?

## 2020-07-28 NOTE — Progress Notes (Signed)
No show
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
## 2020-07-28 NOTE — Discharge Summary (Signed)
Physician Discharge Summary  Patient ID: Venecia Mehl MRN: [id number] DOB/AGE: 1995-05-22 25 y.o.  Admit date: 07/28/2020 Discharge date: 07/28/2020  Admission Diagnoses:Active Problems:   Pregnancy related bilateral lower abdominal pain, antepartum  Discharge Diagnoses:  Active Problems:   Pregnancy related bilateral lower abdominal pain, antepartum   Discharged Condition: good  Hospital Course: Seen and examined, labs done, no s/sx PTL or SROM.    Consults: None  Significant Diagnostic Studies: labs: ROM PLUS and fFN NEG FERN NEG  Treatments: IV hydration  Discharge Exam: Last menstrual period 01/19/2020. No change in exam  A NST procedure was performed with FHR monitoring and a normal baseline established, appropriate time of 20-40 minutes of evaluation, and accels >2 seen w 15x15 characteristics.  Results show a REACTIVE NST.    Disposition: Home   Allergies as of 07/28/2020   No Known Allergies      Medication List     TAKE these medications    amoxicillin 875 MG tablet Commonly known as: AMOXIL Take 1 tablet (875 mg total) by mouth 2 (two) times daily.   escitalopram 10 MG tablet Commonly known as: Lexapro Take 1 tablet (10 mg total) by mouth daily.   escitalopram 10 MG tablet Commonly known as: Lexapro Take 1 tablet (10 mg total) by mouth daily.   hydroxyprogesterone caproate 250 mg/mL Oil injection Commonly known as: MAKENA Inject 1 mL (250 mg total) into the muscle once for 1 dose.   lidocaine 2 % solution Commonly known as: XYLOCAINE Use as directed 15 mLs in the mouth or throat every 4 (four) hours as needed for mouth pain.   lidocaine 2 % solution Commonly known as: XYLOCAINE Use as directed 5 mLs in the mouth or throat every 6 (six) hours as needed for mouth pain.   ondansetron 4 MG disintegrating tablet Commonly known as: Zofran ODT Take 1 tablet (4 mg total) by mouth every 8 (eight) hours as needed for nausea or vomiting.    prenatal vitamin w/FE, FA 27-1 MG Tabs tablet Take 1 tablet by mouth daily at 12 noon.         Signed: Letitia Libra 07/28/2020, 8:57 PM

## 2020-07-31 ENCOUNTER — Other Ambulatory Visit: Payer: Medicaid Other

## 2020-07-31 ENCOUNTER — Encounter: Payer: Medicaid Other | Admitting: Advanced Practice Midwife

## 2020-08-09 ENCOUNTER — Other Ambulatory Visit: Payer: Medicaid Other

## 2020-08-09 ENCOUNTER — Ambulatory Visit (INDEPENDENT_AMBULATORY_CARE_PROVIDER_SITE_OTHER): Payer: Medicaid Other | Admitting: Advanced Practice Midwife

## 2020-08-09 ENCOUNTER — Other Ambulatory Visit: Payer: Self-pay

## 2020-08-09 ENCOUNTER — Encounter: Payer: Self-pay | Admitting: Advanced Practice Midwife

## 2020-08-09 ENCOUNTER — Ambulatory Visit: Payer: Medicaid Other

## 2020-08-09 VITALS — BP 110/64 | Wt 183.0 lb

## 2020-08-09 DIAGNOSIS — Z8751 Personal history of pre-term labor: Secondary | ICD-10-CM | POA: Diagnosis not present

## 2020-08-09 DIAGNOSIS — O0992 Supervision of high risk pregnancy, unspecified, second trimester: Secondary | ICD-10-CM

## 2020-08-09 DIAGNOSIS — Z3A29 29 weeks gestation of pregnancy: Secondary | ICD-10-CM

## 2020-08-09 DIAGNOSIS — O0993 Supervision of high risk pregnancy, unspecified, third trimester: Secondary | ICD-10-CM

## 2020-08-09 LAB — POCT URINALYSIS DIPSTICK OB
Glucose, UA: NEGATIVE
POC,PROTEIN,UA: NEGATIVE

## 2020-08-09 MED ORDER — HYDROXYPROGESTERONE CAPROATE 250 MG/ML IM OIL
250.0000 mg | TOPICAL_OIL | Freq: Once | INTRAMUSCULAR | Status: AC
  Administered 2020-08-09: 250 mg via INTRAMUSCULAR

## 2020-08-09 NOTE — Addendum Note (Signed)
Addended by: Donnetta Hail on: 08/09/2020 03:17 PM   Modules accepted: Orders
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
## 2020-08-09 NOTE — Progress Notes (Signed)
ROB- 1 hr GTT, 17-P, no concerns

## 2020-08-10 LAB — 28 WEEK RH+PANEL
Basophils Absolute: 0 10*3/uL (ref 0.0–0.2)
Basos: 1 %
EOS (ABSOLUTE): 0.2 10*3/uL (ref 0.0–0.4)
Eos: 3 %
Gestational Diabetes Screen: 83 mg/dL (ref 65–139)
HIV Screen 4th Generation wRfx: NONREACTIVE
Hematocrit: 34.6 % (ref 34.0–46.6)
Hemoglobin: 11.5 g/dL (ref 11.1–15.9)
Immature Grans (Abs): 0 10*3/uL (ref 0.0–0.1)
Immature Granulocytes: 1 %
Lymphocytes Absolute: 2.2 10*3/uL (ref 0.7–3.1)
Lymphs: 35 %
MCH: 29.5 pg (ref 26.6–33.0)
MCHC: 33.2 g/dL (ref 31.5–35.7)
MCV: 89 fL (ref 79–97)
Monocytes Absolute: 0.5 10*3/uL (ref 0.1–0.9)
Monocytes: 8 %
Neutrophils Absolute: 3.3 10*3/uL (ref 1.4–7.0)
Neutrophils: 52 %
Platelets: 163 10*3/uL (ref 150–450)
RBC: 3.9 x10E6/uL (ref 3.77–5.28)
RDW: 12.6 % (ref 11.7–15.4)
RPR Ser Ql: NONREACTIVE
WBC: 6.3 10*3/uL (ref 3.4–10.8)

## 2020-08-16 ENCOUNTER — Ambulatory Visit: Payer: Medicaid Other

## 2020-08-24 ENCOUNTER — Encounter: Payer: Self-pay | Admitting: Obstetrics & Gynecology

## 2020-08-24 ENCOUNTER — Ambulatory Visit (INDEPENDENT_AMBULATORY_CARE_PROVIDER_SITE_OTHER): Payer: Medicaid Other | Admitting: Obstetrics & Gynecology

## 2020-08-24 ENCOUNTER — Other Ambulatory Visit: Payer: Self-pay

## 2020-08-24 VITALS — BP 120/80 | Wt 185.0 lb

## 2020-08-24 DIAGNOSIS — Z8751 Personal history of pre-term labor: Secondary | ICD-10-CM

## 2020-08-24 DIAGNOSIS — Z3A31 31 weeks gestation of pregnancy: Secondary | ICD-10-CM

## 2020-08-24 DIAGNOSIS — O0993 Supervision of high risk pregnancy, unspecified, third trimester: Secondary | ICD-10-CM

## 2020-08-24 DIAGNOSIS — Z98891 History of uterine scar from previous surgery: Secondary | ICD-10-CM

## 2020-08-24 MED ORDER — HYDROXYPROGESTERONE CAPROATE 250 MG/ML IM OIL
250.0000 mg | TOPICAL_OIL | Freq: Once | INTRAMUSCULAR | Status: AC
  Administered 2020-08-24: 250 mg via INTRAMUSCULAR
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
## 2020-09-07 ENCOUNTER — Ambulatory Visit (INDEPENDENT_AMBULATORY_CARE_PROVIDER_SITE_OTHER): Payer: Medicaid Other | Admitting: Obstetrics and Gynecology

## 2020-09-07 ENCOUNTER — Other Ambulatory Visit: Payer: Self-pay

## 2020-09-07 ENCOUNTER — Encounter: Payer: Self-pay | Admitting: Obstetrics and Gynecology

## 2020-09-07 VITALS — BP 126/74 | Wt 185.0 lb

## 2020-09-07 DIAGNOSIS — Z8751 Personal history of pre-term labor: Secondary | ICD-10-CM | POA: Diagnosis not present

## 2020-09-07 DIAGNOSIS — Z8759 Personal history of other complications of pregnancy, childbirth and the puerperium: Secondary | ICD-10-CM

## 2020-09-07 DIAGNOSIS — Z98891 History of uterine scar from previous surgery: Secondary | ICD-10-CM

## 2020-09-07 DIAGNOSIS — Z3A33 33 weeks gestation of pregnancy: Secondary | ICD-10-CM

## 2020-09-07 DIAGNOSIS — O0993 Supervision of high risk pregnancy, unspecified, third trimester: Secondary | ICD-10-CM

## 2020-09-07 MED ORDER — HYDROXYPROGESTERONE CAPROATE 250 MG/ML IM OIL
250.0000 mg | TOPICAL_OIL | Freq: Once | INTRAMUSCULAR | Status: AC
  Administered 2020-09-07: 250 mg via INTRAMUSCULAR
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
## 2020-09-07 NOTE — Addendum Note (Signed)
Addended by: Cornelius Moras D on: 09/07/2020 12:19 PM   Modules accepted: Orders

## 2020-09-14 ENCOUNTER — Ambulatory Visit: Payer: Medicaid Other

## 2020-09-27 ENCOUNTER — Other Ambulatory Visit (HOSPITAL_COMMUNITY)
Admission: RE | Admit: 2020-09-27 | Discharge: 2020-09-27 | Disposition: A | Discharge status: Home / Self Care | Payer: Medicaid Other | Source: Ambulatory Visit | Attending: Obstetrics and Gynecology | Admitting: Obstetrics and Gynecology

## 2020-09-27 ENCOUNTER — Ambulatory Visit (INDEPENDENT_AMBULATORY_CARE_PROVIDER_SITE_OTHER): Payer: Medicaid Other | Admitting: Obstetrics and Gynecology

## 2020-09-27 ENCOUNTER — Other Ambulatory Visit: Payer: Self-pay

## 2020-09-27 ENCOUNTER — Encounter: Payer: Self-pay | Admitting: Obstetrics and Gynecology

## 2020-09-27 VITALS — BP 118/74 | Ht 62.0 in | Wt 188.0 lb

## 2020-09-27 DIAGNOSIS — Z3A36 36 weeks gestation of pregnancy: Secondary | ICD-10-CM | POA: Insufficient documentation

## 2020-09-27 DIAGNOSIS — O0993 Supervision of high risk pregnancy, unspecified, third trimester: Secondary | ICD-10-CM

## 2020-09-27 DIAGNOSIS — Z98891 History of uterine scar from previous surgery: Secondary | ICD-10-CM

## 2020-09-27 DIAGNOSIS — Z8759 Personal history of other complications of pregnancy, childbirth and the puerperium: Secondary | ICD-10-CM

## 2020-09-27 DIAGNOSIS — Z8751 Personal history of pre-term labor: Secondary | ICD-10-CM

## 2020-09-27 NOTE — Progress Notes (Signed)
OB History & Physical   History of Present Illness:  Chief Complaint: history of classical cesarean delivery  HPI:  Maria Richmond is a 25 y.o. [phone] female at [redacted]w[redacted]d dated by LMP and 8 week ultrasound.  Her pregnancy has been complicated by limited prenatal care, history of preterm delivery, history of placental abruption, history of classical cesarean section .    She denies contractions.   She denies leakage of fluid.   She denies vaginal bleeding.   She reports fetal movement.    Total weight gain for pregnancy: 17 lb (7.711 kg)   Obstetrical Problem List: EDD 10/25/20 Problems (from 03/21/20 to present)     Problem Noted Resolved   Supervision of high risk pregnancy in second trimester 04/25/2020 by Zipporah Plants, CNM No   Overview Addendum 08/09/2020  2:57 PM by Tresea Mall, CNM     Nursing Staff Provider  Office Location  [facility] Dating   LMP = 8wk [place]  Language  English Anatomy US   complete  Flu Vaccine   declined Genetic Screen  NIPS:  Neg x 3, XY  TDaP vaccine    Hgb A1C or  GTT Early : n/a Third trimester :   Rhogam  Not needed   LAB RESULTS -  Feeding Plan  breast Blood Type   O +  Contraception  Depo vs Nexplanon Antibody   negative  Circumcision  Rubella   non-immune  Pediatrician   RPR   non-reactive  Support Person  HBsAg   negative  Prenatal Classes  diuscussed HIV   non-reactive    Varicella   non-immune  BTL Consent  n/a GBS  (For PCN allergy, check sensitivities)        VBAC Consent  RCS Pap  04/25/20 - NILM    Hgb Electro   normal adult hgb    CF  negative     SMA  negative       History of classical cesarean section      Marijuana use 04/25/2020 by Zipporah Plants, CNM No   History of placental abruption 09/06/2014 by Vena Austria, MD No   Overview Signed 04/25/2020 11:25 AM by Zipporah Plants, CNM    Formatting of this note might be different from the original. In setting of PPROM.      History of preterm delivery 09/06/2014 by Tresea Mall, CNM No   Overview Signed 07/08/2016  3:20 PM by Tresea Mall, CNM    Overview:  With PPROM at 26 wks. [name] admitted at [redacted]w[redacted]d after PPROM.  She was started on latency antibiotics.  Received ICN consult.  She had previously received BMZ and did not received rescue course on admission.   On HD#4 she began having vaginal bleeding.  She was placed on fetal heart rate monitor to ensure fetal well-being.  She continued to have bleeding and new onset abdominal pain.  Placental abruption was suspected and she underwent urgent Classical cesarean section.  See procedure note for details.     Findings at time of delivery: A female infant in breech presentation.  Amniotic fluid - Clear.  Birth weight 870 g.  Apgars of 6 and 8. Arterial pH = 7.31, Venous pH = 7.32.  Placenta with a three-vessel cord, approximately 30% abruption with evidence of old clots in the uterus.  Grossly normal uterus, tubes and ovaries bilaterally. Postpartum course complicated by neonatal demise.  Patient was taken to ICN at the time of coding and elected coding to  stop and rather, to hold her baby      History of classical cesarean section 07/20/2014 by Jimmey Ralph, MD No   Overview Addendum 04/25/2020 11:25 AM by Zipporah Plants, CNM    [name] admitted at [redacted]w[redacted]d after PPROM.  She was started on latency antibiotics.  Received ICN consult.  She had previously received BMZ and did not received rescue course on admission.   On HD#4 she began having vaginal bleeding.  She was placed on fetal heart rate monitor to ensure fetal well-being.  She continued to have bleeding and new onset abdominal pain.  Placental abruption was suspected and she underwent urgent Classical cesarean section.  See procedure note for details.     Findings at time of delivery: A female infant in breech presentation.  Amniotic fluid - Clear.  Birth weight 870 g.  Apgars of 6 and 8. Arterial pH = 7.31, Venous pH = 7.32.  Placenta with a  three-vessel cord, approximately 30% abruption with evidence of old clots in the uterus.  Grossly normal uterus, tubes and ovaries bilaterally. Postpartum course complicated by neonatal demise.  Patient was taken to ICN at the time of coding and elected coding to stop and rather, to hold her baby  Formatting of this note might be different from the original. For placental abruption in the setting of PPROM @ [redacted]w[redacted]d  SHOULD NOT LABOR IN THE FUTURE           Maternal Medical History:   Past Medical History:  Diagnosis Date   Depression    GERD (gastroesophageal reflux disease)    Medical history non-contributory    Preterm labor     Past Surgical History:  Procedure Laterality Date   CESAREAN SECTION     CESAREAN SECTION N/A 07/17/2016   Procedure: CESAREAN SECTION, Female, 6lb, 6oz;  Surgeon: Conard Novak, MD;  Location: [facility] ORS;  Service: Obstetrics;  Laterality: N/A;   CESAREAN SECTION N/A 12/18/2017   Procedure: CESAREAN SECTION;  Surgeon: Natale Milch, MD;  Location: [facility] ORS;  Service: Obstetrics;  Laterality: N/A;  Time of Birth: 13:00 Sex: Female Wt: 6 lb 0 oz    No Known Allergies  Prior to Admission medications   Medication Sig Start Date End Date Taking? Authorizing Provider  escitalopram (LEXAPRO) 10 MG tablet Take 1 tablet (10 mg total) by mouth daily. 04/25/20   Zipporah Plants, CNM  hydroxyprogesterone caproate (MAKENA) 250 mg/mL OIL injection Inject 1 mL (250 mg total) into the muscle once for 1 dose. 05/11/20 05/11/20  Conard Novak, MD  prenatal vitamin w/FE, FA (PRENATAL 1 + 1) 27-1 MG TABS tablet Take 1 tablet by mouth daily at 12 noon. 03/06/20   Adline Potter, NP    OB History  Gravida Para Term Preterm AB Living  5 3 2 1 1 2   SAB IAB Ectopic Multiple Live Births  1 0 0 0 3    # Outcome Date GA Lbr Len/2nd Weight Sex Delivery Anes PTL Lv  5 Current           4 Term 12/18/17 [redacted]w[redacted]d  6 lb 0.3 oz (2.73 kg) F CS-Vac Spinal  LIV  3 Term  07/17/16 [redacted]w[redacted]d  6 lb 6 oz (2.892 kg) M CS-LTranv Spinal  LIV  2 Preterm 07/20/14    F CS-Classical   ND     Complications: Abruptio Placenta, Preterm premature rupture of membranes (PPROM) delivered, current hospitalization  1 SAB  Prenatal care site: [facility]  Social History: She  reports that she has been smoking cigarettes. She has been smoking an average of .25 packs per day. She has never used smokeless tobacco. She reports that she does not currently use alcohol. She reports that she does not currently use drugs after having used the following drugs: Marijuana.  Family History: family history includes Diabetes in her maternal grandmother; Heart Problems in her maternal grandmother; Hypertension in her maternal grandmother; Stroke in her maternal grandmother; Thyroid disease in her maternal grandmother.   Review of Systems:  Review of Systems  Constitutional: Negative.   HENT: Negative.    Eyes: Negative.   Respiratory: Negative.    Cardiovascular: Negative.   Gastrointestinal: Negative.   Genitourinary: Negative.   Musculoskeletal: Negative.   Skin: Negative.   Neurological: Negative.   Psychiatric/Behavioral: Negative.      Physical Exam:  BP 118/74   Ht 5\' 2"  (1.575 m)   Wt 188 lb (85.3 kg)   LMP 01/19/2020   BMI 34.39 kg/m   Physical Exam Constitutional:      General: She is not in acute distress.    Appearance: Normal appearance. She is well-developed.  HENT:     Head: Normocephalic and atraumatic.  Eyes:     General: No scleral icterus.    Conjunctiva/sclera: Conjunctivae normal.  Cardiovascular:     Rate and Rhythm: Normal rate and regular rhythm.     Heart sounds: No murmur heard.   No friction rub. No gallop.  Pulmonary:     Effort: Pulmonary effort is normal. No respiratory distress.     Breath sounds: Normal breath sounds. No wheezing or rales.  Abdominal:     General: Bowel sounds are normal. There is no distension.     Palpations:  Abdomen is soft. There is no mass.     Tenderness: There is no abdominal tenderness. There is no guarding or rebound.  Musculoskeletal:        General: Normal range of motion.     Cervical back: Normal range of motion and neck supple.  Neurological:     General: No focal deficit present.     Mental Status: She is alert and oriented to person, place, and time.     Cranial Nerves: No cranial nerve deficit.  Skin:    General: Skin is warm and dry.     Findings: No erythema.  Psychiatric:        Mood and Affect: Mood normal.        Behavior: Behavior normal.        Judgment: Judgment normal.      Baseline FHR: 145 beats/min    Fundal height: 36 cm  No results found for: SARSCOV2NAA]  Assessment:  Cullen Tyeshia Cornforth is a 25 y.o. [phone] female at [redacted]w[redacted]d with history of classical cesarean section, here for pre-op visit for repeat c-section.   Plan:  Admit to Labor & Delivery  CBC, T&S, NPO, IVF GBS collected today.   Consented for repeat low transverse cesarean section. Plans IUD for contraception.    [redacted]w[redacted]d, MD 09/27/2020 3:34 PM

## 2020-09-27 NOTE — H&P (View-Only) (Signed)
OB History & Physical   History of Present Illness:  Chief Complaint: history of classical cesarean delivery  HPI:  Maria Richmond is a 25 y.o. [phone] female at [redacted]w[redacted]d dated by LMP and 8 week ultrasound.  Her pregnancy has been complicated by limited prenatal care, history of preterm delivery, history of placental abruption, history of classical cesarean section .    She denies contractions.   She denies leakage of fluid.   She denies vaginal bleeding.   She reports fetal movement.    Total weight gain for pregnancy: 17 lb (7.711 kg)   Obstetrical Problem List: EDD 10/25/20 Problems (from 03/21/20 to present)     Problem Noted Resolved   Supervision of high risk pregnancy in second trimester 04/25/2020 by Zipporah Plants, CNM No   Overview Addendum 08/09/2020  2:57 PM by Tresea Mall, CNM     Nursing Staff Provider  Office Location  [facility] Dating   LMP = 8wk [place]  Language  English Anatomy US   complete  Flu Vaccine   declined Genetic Screen  NIPS:  Neg x 3, XY  TDaP vaccine    Hgb A1C or  GTT Early : n/a Third trimester :   Rhogam  Not needed   LAB RESULTS -  Feeding Plan  breast Blood Type   O +  Contraception  Depo vs Nexplanon Antibody   negative  Circumcision  Rubella   non-immune  Pediatrician   RPR   non-reactive  Support Person  HBsAg   negative  Prenatal Classes  diuscussed HIV   non-reactive    Varicella   non-immune  BTL Consent  n/a GBS  (For PCN allergy, check sensitivities)        VBAC Consent  RCS Pap  04/25/20 - NILM    Hgb Electro   normal adult hgb    CF  negative     SMA  negative       History of classical cesarean section      Marijuana use 04/25/2020 by Zipporah Plants, CNM No   History of placental abruption 09/06/2014 by Vena Austria, MD No   Overview Signed 04/25/2020 11:25 AM by Zipporah Plants, CNM    Formatting of this note might be different from the original. In setting of PPROM.      History of preterm delivery 09/06/2014 by Tresea Mall, CNM No   Overview Signed 07/08/2016  3:20 PM by Tresea Mall, CNM    Overview:  With PPROM at 26 wks. [name] admitted at [redacted]w[redacted]d after PPROM.  She was started on latency antibiotics.  Received ICN consult.  She had previously received BMZ and did not received rescue course on admission.   On HD#4 she began having vaginal bleeding.  She was placed on fetal heart rate monitor to ensure fetal well-being.  She continued to have bleeding and new onset abdominal pain.  Placental abruption was suspected and she underwent urgent Classical cesarean section.  See procedure note for details.     Findings at time of delivery: A female infant in breech presentation.  Amniotic fluid - Clear.  Birth weight 870 g.  Apgars of 6 and 8. Arterial pH = 7.31, Venous pH = 7.32.  Placenta with a three-vessel cord, approximately 30% abruption with evidence of old clots in the uterus.  Grossly normal uterus, tubes and ovaries bilaterally. Postpartum course complicated by neonatal demise.  Patient was taken to ICN at the time of coding and elected coding to  stop and rather, to hold her baby      History of classical cesarean section 07/20/2014 by Livingston, Elizabeth, MD No   Overview Addendum 04/25/2020 11:25 AM by Veal, Katelyn, CNM    [name] admitted at [redacted]w[redacted]d after PPROM.  She was started on latency antibiotics.  Received ICN consult.  She had previously received BMZ and did not received rescue course on admission.   On HD#4 she began having vaginal bleeding.  She was placed on fetal heart rate monitor to ensure fetal well-being.  She continued to have bleeding and new onset abdominal pain.  Placental abruption was suspected and she underwent urgent Classical cesarean section.  See procedure note for details.     Findings at time of delivery: A female infant in breech presentation.  Amniotic fluid - Clear.  Birth weight 870 g.  Apgars of 6 and 8. Arterial pH = 7.31, Venous pH = 7.32.  Placenta with a  three-vessel cord, approximately 30% abruption with evidence of old clots in the uterus.  Grossly normal uterus, tubes and ovaries bilaterally. Postpartum course complicated by neonatal demise.  Patient was taken to ICN at the time of coding and elected coding to stop and rather, to hold her baby  Formatting of this note might be different from the original. For placental abruption in the setting of PPROM @ [redacted]w[redacted]d  SHOULD NOT LABOR IN THE FUTURE           Maternal Medical History:   Past Medical History:  Diagnosis Date   Depression    GERD (gastroesophageal reflux disease)    Medical history non-contributory    Preterm labor     Past Surgical History:  Procedure Laterality Date   CESAREAN SECTION     CESAREAN SECTION N/A 07/17/2016   Procedure: CESAREAN SECTION, Female, 6lb, 6oz;  Surgeon: Domonique Cothran D, MD;  Location: [facility] ORS;  Service: Obstetrics;  Laterality: N/A;   CESAREAN SECTION N/A 12/18/2017   Procedure: CESAREAN SECTION;  Surgeon: Schuman, Christanna R, MD;  Location: [facility] ORS;  Service: Obstetrics;  Laterality: N/A;  Time of Birth: 13:00 Sex: Female Wt: 6 lb 0 oz    No Known Allergies  Prior to Admission medications   Medication Sig Start Date End Date Taking? Authorizing Provider  escitalopram (LEXAPRO) 10 MG tablet Take 1 tablet (10 mg total) by mouth daily. 04/25/20   Veal, Katelyn, CNM  hydroxyprogesterone caproate (MAKENA) 250 mg/mL OIL injection Inject 1 mL (250 mg total) into the muscle once for 1 dose. 05/11/20 05/11/20  Aviv Lengacher D, MD  prenatal vitamin w/FE, FA (PRENATAL 1 + 1) 27-1 MG TABS tablet Take 1 tablet by mouth daily at 12 noon. 03/06/20   Griffin, Jennifer A, NP    OB History  Gravida Para Term Preterm AB Living  5 3 2 1 1 2  SAB IAB Ectopic Multiple Live Births  1 0 0 0 3    # Outcome Date GA Lbr Len/2nd Weight Sex Delivery Anes PTL Lv  5 Current           4 Term 12/18/17 [redacted]w[redacted]d  6 lb 0.3 oz (2.73 kg) F CS-Vac Spinal  LIV  3 Term  07/17/16 [redacted]w[redacted]d  6 lb 6 oz (2.892 kg) M CS-LTranv Spinal  LIV  2 Preterm 07/20/14    F CS-Classical   ND     Complications: Abruptio Placenta, Preterm premature rupture of membranes (PPROM) delivered, current hospitalization  1 SAB               Prenatal care site: [facility]  Social History: She  reports that she has been smoking cigarettes. She has been smoking an average of .25 packs per day. She has never used smokeless tobacco. She reports that she does not currently use alcohol. She reports that she does not currently use drugs after having used the following drugs: Marijuana.  Family History: family history includes Diabetes in her maternal grandmother; Heart Problems in her maternal grandmother; Hypertension in her maternal grandmother; Stroke in her maternal grandmother; Thyroid disease in her maternal grandmother.   Review of Systems:  Review of Systems  Constitutional: Negative.   HENT: Negative.    Eyes: Negative.   Respiratory: Negative.    Cardiovascular: Negative.   Gastrointestinal: Negative.   Genitourinary: Negative.   Musculoskeletal: Negative.   Skin: Negative.   Neurological: Negative.   Psychiatric/Behavioral: Negative.      Physical Exam:  BP 118/74   Ht 5' 2" (1.575 m)   Wt 188 lb (85.3 kg)   LMP 01/19/2020   BMI 34.39 kg/m   Physical Exam Constitutional:      General: She is not in acute distress.    Appearance: Normal appearance. She is well-developed.  HENT:     Head: Normocephalic and atraumatic.  Eyes:     General: No scleral icterus.    Conjunctiva/sclera: Conjunctivae normal.  Cardiovascular:     Rate and Rhythm: Normal rate and regular rhythm.     Heart sounds: No murmur heard.   No friction rub. No gallop.  Pulmonary:     Effort: Pulmonary effort is normal. No respiratory distress.     Breath sounds: Normal breath sounds. No wheezing or rales.  Abdominal:     General: Bowel sounds are normal. There is no distension.     Palpations:  Abdomen is soft. There is no mass.     Tenderness: There is no abdominal tenderness. There is no guarding or rebound.  Musculoskeletal:        General: Normal range of motion.     Cervical back: Normal range of motion and neck supple.  Neurological:     General: No focal deficit present.     Mental Status: She is alert and oriented to person, place, and time.     Cranial Nerves: No cranial nerve deficit.  Skin:    General: Skin is warm and dry.     Findings: No erythema.  Psychiatric:        Mood and Affect: Mood normal.        Behavior: Behavior normal.        Judgment: Judgment normal.      Baseline FHR: 145 beats/min    Fundal height: 36 cm  No results found for: SARSCOV2NAA]  Assessment:  Maria Richmond is a 25 y.o. G5P2112 female at [redacted]w[redacted]d with history of classical cesarean section, here for pre-op visit for repeat c-section.   Plan:  Admit to Labor & Delivery  CBC, T&S, NPO, IVF GBS collected today.   Consented for repeat low transverse cesarean section. Plans IUD for contraception.    Skyra Crichlow, MD 09/27/2020 3:34 PM    

## 2020-09-28 ENCOUNTER — Inpatient Hospital Stay: Admission: RE | Admit: 2020-09-28 | Payer: Medicaid Other | Source: Ambulatory Visit

## 2020-09-29 LAB — STREP GP B NAA: Strep Gp B NAA: POSITIVE — AB

## 2020-10-01 ENCOUNTER — Other Ambulatory Visit: Payer: Self-pay

## 2020-10-01 ENCOUNTER — Encounter
Admission: RE | Admit: 2020-10-01 | Discharge: 2020-10-01 | Disposition: A | Discharge status: Home / Self Care | Payer: Medicaid Other | Source: Ambulatory Visit | Attending: Obstetrics and Gynecology | Admitting: Obstetrics and Gynecology

## 2020-10-01 LAB — CERVICOVAGINAL ANCILLARY ONLY
Chlamydia: NEGATIVE
Comment: NEGATIVE
Comment: NORMAL
Neisseria Gonorrhea: NEGATIVE

## 2020-10-01 NOTE — Patient Instructions (Addendum)
Your procedure is scheduled [id number] 10/04/20 Report to the Emergency Department at 5:30 am Thursday 10/04/20  REMEMBER: Instructions that are not followed completely may result in serious medical risk, up to and including death; or upon the discretion of your surgeon and anesthesiologist your surgery may need to be rescheduled.  Do not eat food after midnight the night before surgery.  No gum chewing, lozengers or hard candies.  You may however, drink CLEAR liquids up to 2 hours before you are scheduled to arrive for your surgery. Do not drink anything within 2 hours of your scheduled arrival time.  Clear liquids include: - water  - apple juice without pulp - gatorade (not RED, PURPLE, OR BLUE) - black coffee or tea (Do NOT add milk or creamers to the coffee or tea) Do NOT drink anything that is not on this list.  One week prior to surgery: Stop Anti-inflammatories (NSAIDS) such as Advil, Aleve, Ibuprofen, Motrin, Naproxen, Naprosyn and Aspirin based products such as Excedrin, Goodys Powder, BC Powder. Stop ANY OVER THE COUNTER supplements until after surgery. You may however, continue to take Tylenol if needed for pain up until the day of surgery.  No Alcohol for 24 hours before or after surgery.  No Smoking including e-cigarettes for 24 hours prior to surgery.  No chewable tobacco products for at least 6 hours prior to surgery.  No nicotine patches on the day of surgery.  Do not use any "recreational" drugs for at least a week prior to your surgery.  Please be advised that the combination of cocaine and anesthesia may have negative outcomes, up to and including death. If you test positive for cocaine, your surgery will be cancelled.  On the morning of surgery brush your teeth with toothpaste and water, you may rinse your mouth with mouthwash if you wish. Do not swallow any toothpaste or mouthwash.  Use CHG Soap or wipes as directed on instruction sheet.  Do not wear jewelry,  make-up, hairpins, clips or nail polish.  Do not wear lotions, powders, or perfumes.   Do not shave body from the neck down 48 hours prior to surgery just in case you cut yourself which could leave a site for infection.  Also, freshly shaved skin may become irritated if using the CHG soap.  Contact lenses, hearing aids and dentures may not be worn into surgery.  Do not bring valuables to the hospital. [facility] is not responsible for any missing/lost belongings or valuables.   Notify your doctor if there is any change in your medical condition (cold, fever, infection).  Wear comfortable clothing (specific to your surgery type) to the hospital.  Please call the Pre-admissions Testing Dept. at [phone] if you have any questions about these instructions.  Inpatient Visitation:    Visiting hours are 7 a.m. to 8 p.m. Up to two visitors and a Doula ages 16+ are allowed at one time in a patient room. The visitors may rotate out with other people during the day. Visitors must check out when they leave, or other visitors will not be allowed. One designated support person may remain overnight. The visitor must pass COVID-19 screenings, use hand sanitizer when entering and exiting the patient's room and wear a mask at all times, including in the patient's room. Patients must also wear a mask when staff or their visitor are in the room. Masking is required regardless of vaccination status.

## 2020-10-02 ENCOUNTER — Encounter: Payer: Self-pay | Admitting: Urgent Care

## 2020-10-02 ENCOUNTER — Other Ambulatory Visit: Payer: Medicaid Other

## 2020-10-02 ENCOUNTER — Encounter
Admission: RE | Admit: 2020-10-02 | Discharge: 2020-10-02 | Disposition: A | Discharge status: Home / Self Care | Payer: Medicaid Other | Source: Ambulatory Visit | Attending: Obstetrics and Gynecology | Admitting: Obstetrics and Gynecology

## 2020-10-02 ENCOUNTER — Inpatient Hospital Stay: Admission: RE | Admit: 2020-10-02 | Payer: Medicaid Other | Source: Ambulatory Visit

## 2020-10-02 DIAGNOSIS — Z20822 Contact with and (suspected) exposure to covid-19: Secondary | ICD-10-CM | POA: Insufficient documentation

## 2020-10-02 DIAGNOSIS — Z01812 Encounter for preprocedural laboratory examination: Secondary | ICD-10-CM | POA: Insufficient documentation

## 2020-10-02 LAB — RAPID HIV SCREEN (HIV 1/2 AB+AG)
HIV 1/2 Antibodies: NONREACTIVE
HIV-1 P24 Antigen - HIV24: NONREACTIVE

## 2020-10-02 LAB — CBC
HCT: 33.6 % — ABNORMAL LOW (ref 36.0–46.0)
Hemoglobin: 11.8 g/dL — ABNORMAL LOW (ref 12.0–15.0)
MCH: 31.1 pg (ref 26.0–34.0)
MCHC: 35.1 g/dL (ref 30.0–36.0)
MCV: 88.7 fL (ref 80.0–100.0)
Platelets: 145 10*3/uL — ABNORMAL LOW (ref 150–400)
RBC: 3.79 MIL/uL — ABNORMAL LOW (ref 3.87–5.11)
RDW: 13 % (ref 11.5–15.5)
WBC: 6.3 10*3/uL (ref 4.0–10.5)
nRBC: 0 % (ref 0.0–0.2)

## 2020-10-02 LAB — TYPE AND SCREEN
ABO/RH(D): O POS
Antibody Screen: NEGATIVE
Extend sample reason: UNDETERMINED

## 2020-10-03 LAB — RPR: RPR Ser Ql: NONREACTIVE

## 2020-10-03 LAB — SARS CORONAVIRUS 2 (TAT 6-24 HRS): SARS Coronavirus 2: NEGATIVE

## 2020-10-03 MED ORDER — CHLORHEXIDINE GLUCONATE 0.12 % MT SOLN
15.0000 mL | Freq: Once | OROMUCOSAL | Status: AC
  Administered 2020-10-04: 15 mL via OROMUCOSAL
  Filled 2020-10-03: qty 15

## 2020-10-03 MED ORDER — BUPIVACAINE HCL (PF) 0.5 % IJ SOLN
5.0000 mL | Freq: Once | INTRAMUSCULAR | Status: DC
  Filled 2020-10-03: qty 10

## 2020-10-03 MED ORDER — LACTATED RINGERS IV BOLUS
1000.0000 mL | Freq: Once | INTRAVENOUS | Status: AC
  Administered 2020-10-04: 1000 mL via INTRAVENOUS

## 2020-10-03 MED ORDER — ORAL CARE MOUTH RINSE: 15.0000 mL | Freq: Once | OROMUCOSAL | Status: AC

## 2020-10-03 MED ORDER — CEFAZOLIN SODIUM-DEXTROSE 2-4 GM/100ML-% IV SOLN
2.0000 g | INTRAVENOUS | Status: AC
  Administered 2020-10-04: 2 g via INTRAVENOUS
  Filled 2020-10-03: qty 100

## 2020-10-03 MED ORDER — BUPIVACAINE 0.25 % ON-Q PUMP DUAL CATH 400 ML
400.0000 mL | INJECTION | Status: DC
  Filled 2020-10-03: qty 400

## 2020-10-03 MED ORDER — LACTATED RINGERS IV SOLN: INTRAVENOUS | Status: DC

## 2020-10-04 ENCOUNTER — Encounter
Admission: RE | Disposition: A | Discharge status: Home / Self Care | Payer: Self-pay | Source: Home / Self Care | Attending: Obstetrics and Gynecology

## 2020-10-04 ENCOUNTER — Encounter: Payer: Self-pay | Admitting: Obstetrics and Gynecology

## 2020-10-04 ENCOUNTER — Inpatient Hospital Stay
Admission: RE | Admit: 2020-10-04 | Discharge: 2020-10-07 | DRG: 787 | Disposition: A | Discharge status: Home / Self Care | Payer: Medicaid Other | Attending: Obstetrics and Gynecology | Admitting: Obstetrics and Gynecology

## 2020-10-04 ENCOUNTER — Inpatient Hospital Stay: Payer: Medicaid Other | Admitting: Anesthesiology

## 2020-10-04 ENCOUNTER — Other Ambulatory Visit: Payer: Self-pay

## 2020-10-04 DIAGNOSIS — O34212 Maternal care for vertical scar from previous cesarean delivery: Secondary | ICD-10-CM | POA: Diagnosis present

## 2020-10-04 DIAGNOSIS — D62 Acute posthemorrhagic anemia: Secondary | ICD-10-CM | POA: Diagnosis not present

## 2020-10-04 DIAGNOSIS — F419 Anxiety disorder, unspecified: Secondary | ICD-10-CM

## 2020-10-04 DIAGNOSIS — O99893 Other specified diseases and conditions complicating puerperium: Secondary | ICD-10-CM | POA: Diagnosis not present

## 2020-10-04 DIAGNOSIS — O9081 Anemia of the puerperium: Secondary | ICD-10-CM | POA: Diagnosis not present

## 2020-10-04 DIAGNOSIS — Z98891 History of uterine scar from previous surgery: Secondary | ICD-10-CM

## 2020-10-04 DIAGNOSIS — Z8759 Personal history of other complications of pregnancy, childbirth and the puerperium: Secondary | ICD-10-CM

## 2020-10-04 DIAGNOSIS — O0992 Supervision of high risk pregnancy, unspecified, second trimester: Secondary | ICD-10-CM

## 2020-10-04 DIAGNOSIS — F53 Postpartum depression: Secondary | ICD-10-CM | POA: Diagnosis not present

## 2020-10-04 DIAGNOSIS — Z3A37 37 weeks gestation of pregnancy: Secondary | ICD-10-CM

## 2020-10-04 DIAGNOSIS — Z3A36 36 weeks gestation of pregnancy: Secondary | ICD-10-CM | POA: Diagnosis not present

## 2020-10-04 DIAGNOSIS — F17210 Nicotine dependence, cigarettes, uncomplicated: Secondary | ICD-10-CM | POA: Diagnosis present

## 2020-10-04 DIAGNOSIS — Z8751 Personal history of pre-term labor: Secondary | ICD-10-CM

## 2020-10-04 DIAGNOSIS — O0993 Supervision of high risk pregnancy, unspecified, third trimester: Secondary | ICD-10-CM

## 2020-10-04 DIAGNOSIS — O09293 Supervision of pregnancy with other poor reproductive or obstetric history, third trimester: Secondary | ICD-10-CM | POA: Diagnosis not present

## 2020-10-04 DIAGNOSIS — O99334 Smoking (tobacco) complicating childbirth: Secondary | ICD-10-CM | POA: Diagnosis present

## 2020-10-04 DIAGNOSIS — O0933 Supervision of pregnancy with insufficient antenatal care, third trimester: Secondary | ICD-10-CM

## 2020-10-04 DIAGNOSIS — O34211 Maternal care for low transverse scar from previous cesarean delivery: Secondary | ICD-10-CM | POA: Diagnosis not present

## 2020-10-04 DIAGNOSIS — O99345 Other mental disorders complicating the puerperium: Secondary | ICD-10-CM | POA: Diagnosis not present

## 2020-10-04 DIAGNOSIS — F32A Depression, unspecified: Secondary | ICD-10-CM

## 2020-10-04 DIAGNOSIS — R030 Elevated blood-pressure reading, without diagnosis of hypertension: Secondary | ICD-10-CM | POA: Diagnosis not present

## 2020-10-04 SURGERY — Surgical Case
Anesthesia: Spinal

## 2020-10-04 MED ORDER — NALBUPHINE HCL 10 MG/ML IJ SOLN: 5.0000 mg | INTRAMUSCULAR | Status: DC | PRN

## 2020-10-04 MED ORDER — SODIUM CHLORIDE 0.9 % IV SOLN
INTRAVENOUS | Status: DC | PRN
  Administered 2020-10-04: 40 ug/min via INTRAVENOUS

## 2020-10-04 MED ORDER — SENNOSIDES-DOCUSATE SODIUM 8.6-50 MG PO TABS
2.0000 | ORAL_TABLET | ORAL | Status: DC
  Administered 2020-10-05 – 2020-10-07 (×3): 2 via ORAL
  Filled 2020-10-04 (×3): qty 2

## 2020-10-04 MED ORDER — KETOROLAC TROMETHAMINE 30 MG/ML IJ SOLN
INTRAMUSCULAR | Status: AC
  Filled 2020-10-04: qty 1

## 2020-10-04 MED ORDER — WITCH HAZEL-GLYCERIN EX PADS: 1.0000 "application " | MEDICATED_PAD | CUTANEOUS | Status: DC | PRN

## 2020-10-04 MED ORDER — BUPIVACAINE HCL (PF) 0.5 % IJ SOLN
5.0000 mL | Freq: Once | INTRAMUSCULAR | Status: DC
  Filled 2020-10-04: qty 10

## 2020-10-04 MED ORDER — FENTANYL CITRATE (PF) 100 MCG/2ML IJ SOLN: 25.0000 ug | INTRAMUSCULAR | Status: DC | PRN

## 2020-10-04 MED ORDER — MORPHINE SULFATE (PF) 0.5 MG/ML IJ SOLN
INTRAMUSCULAR | Status: DC | PRN
  Administered 2020-10-04: .1 mg via INTRATHECAL

## 2020-10-04 MED ORDER — ONDANSETRON HCL 4 MG/2ML IJ SOLN
INTRAMUSCULAR | Status: DC | PRN
  Administered 2020-10-04: 4 mg via INTRAVENOUS

## 2020-10-04 MED ORDER — DEXAMETHASONE SODIUM PHOSPHATE 4 MG/ML IJ SOLN
INTRAMUSCULAR | Status: DC | PRN
  Administered 2020-10-04: 10 mg via INTRAVENOUS

## 2020-10-04 MED ORDER — COCONUT OIL OIL
1.0000 "application " | TOPICAL_OIL | Status: DC | PRN
  Filled 2020-10-04: qty 120

## 2020-10-04 MED ORDER — IBUPROFEN 600 MG PO TABS
600.0000 mg | ORAL_TABLET | Freq: Four times a day (QID) | ORAL | Status: DC
  Administered 2020-10-05 – 2020-10-07 (×6): 600 mg via ORAL
  Filled 2020-10-04 (×8): qty 1

## 2020-10-04 MED ORDER — LACTATED RINGERS IV SOLN: INTRAVENOUS | Status: DC

## 2020-10-04 MED ORDER — SOD CITRATE-CITRIC ACID 500-334 MG/5ML PO SOLN: 30.0000 mL | ORAL | Status: DC

## 2020-10-04 MED ORDER — MENTHOL 3 MG MT LOZG
1.0000 | LOZENGE | OROMUCOSAL | Status: DC | PRN
  Filled 2020-10-04: qty 9

## 2020-10-04 MED ORDER — SIMETHICONE 80 MG PO CHEW
80.0000 mg | CHEWABLE_TABLET | Freq: Three times a day (TID) | ORAL | Status: DC
  Administered 2020-10-05 – 2020-10-07 (×8): 80 mg via ORAL
  Filled 2020-10-04 (×10): qty 1

## 2020-10-04 MED ORDER — ONDANSETRON HCL 4 MG/2ML IJ SOLN: 4.0000 mg | Freq: Three times a day (TID) | INTRAMUSCULAR | Status: DC | PRN

## 2020-10-04 MED ORDER — DIPHENHYDRAMINE HCL 50 MG/ML IJ SOLN: 12.5000 mg | INTRAMUSCULAR | Status: DC | PRN

## 2020-10-04 MED ORDER — MORPHINE SULFATE (PF) 0.5 MG/ML IJ SOLN: INTRAMUSCULAR | Status: DC | PRN

## 2020-10-04 MED ORDER — PRENATAL MULTIVITAMIN CH
1.0000 | ORAL_TABLET | Freq: Every day | ORAL | Status: DC
  Administered 2020-10-05 – 2020-10-07 (×2): 1 via ORAL
  Filled 2020-10-04 (×2): qty 1

## 2020-10-04 MED ORDER — DIPHENHYDRAMINE HCL 25 MG PO CAPS: 25.0000 mg | ORAL_CAPSULE | Freq: Four times a day (QID) | ORAL | Status: DC | PRN

## 2020-10-04 MED ORDER — MEASLES, MUMPS & RUBELLA VAC IJ SOLR
0.5000 mL | Freq: Once | INTRAMUSCULAR | Status: DC
  Filled 2020-10-04: qty 0.5

## 2020-10-04 MED ORDER — SODIUM CHLORIDE 0.9% FLUSH: 3.0000 mL | INTRAVENOUS | Status: DC | PRN

## 2020-10-04 MED ORDER — OXYTOCIN-SODIUM CHLORIDE 30-0.9 UT/500ML-% IV SOLN
INTRAVENOUS | Status: AC
  Filled 2020-10-04: qty 500

## 2020-10-04 MED ORDER — DIPHENHYDRAMINE HCL 50 MG/ML IJ SOLN
INTRAMUSCULAR | Status: DC | PRN
  Administered 2020-10-04: 12.5 mg via INTRAVENOUS

## 2020-10-04 MED ORDER — DIPHENHYDRAMINE HCL 25 MG PO CAPS: 25.0000 mg | ORAL_CAPSULE | ORAL | Status: DC | PRN

## 2020-10-04 MED ORDER — VARICELLA VIRUS VACCINE LIVE 1350 PFU/0.5ML IJ SUSR
0.5000 mL | INTRAMUSCULAR | Status: DC | PRN
  Filled 2020-10-04: qty 0.5

## 2020-10-04 MED ORDER — DIPHENHYDRAMINE HCL 50 MG/ML IJ SOLN
INTRAMUSCULAR | Status: AC
  Filled 2020-10-04: qty 1

## 2020-10-04 MED ORDER — KETOROLAC TROMETHAMINE 30 MG/ML IJ SOLN
INTRAMUSCULAR | Status: DC | PRN
  Administered 2020-10-04: 30 mg via INTRAVENOUS

## 2020-10-04 MED ORDER — NALOXONE HCL 4 MG/10ML IJ SOLN
1.0000 ug/kg/h | INTRAVENOUS | Status: DC | PRN
  Filled 2020-10-04: qty 5

## 2020-10-04 MED ORDER — PROMETHAZINE HCL 25 MG/ML IJ SOLN: 6.2500 mg | INTRAMUSCULAR | Status: DC | PRN

## 2020-10-04 MED ORDER — MEPERIDINE HCL 25 MG/ML IJ SOLN: 6.2500 mg | INTRAMUSCULAR | Status: DC | PRN

## 2020-10-04 MED ORDER — BUPIVACAINE IN DEXTROSE 0.75-8.25 % IT SOLN
INTRATHECAL | Status: DC | PRN
  Administered 2020-10-04: 1.6 mL via INTRATHECAL

## 2020-10-04 MED ORDER — OXYTOCIN-SODIUM CHLORIDE 30-0.9 UT/500ML-% IV SOLN
2.5000 [IU]/h | INTRAVENOUS | Status: AC
  Administered 2020-10-04: 2.5 [IU]/h via INTRAVENOUS
  Filled 2020-10-04 (×2): qty 500

## 2020-10-04 MED ORDER — FENTANYL CITRATE (PF) 100 MCG/2ML IJ SOLN
INTRAMUSCULAR | Status: DC | PRN
  Administered 2020-10-04: 15 ug via INTRATHECAL

## 2020-10-04 MED ORDER — OXYCODONE HCL 5 MG PO TABS: 5.0000 mg | ORAL_TABLET | ORAL | Status: DC | PRN

## 2020-10-04 MED ORDER — BUPIVACAINE HCL (PF) 0.5 % IJ SOLN
INTRAMUSCULAR | Status: DC | PRN
  Administered 2020-10-04: 5 mL

## 2020-10-04 MED ORDER — OXYCODONE-ACETAMINOPHEN 5-325 MG PO TABS: 2.0000 | ORAL_TABLET | ORAL | Status: DC | PRN

## 2020-10-04 MED ORDER — NALBUPHINE HCL 10 MG/ML IJ SOLN: 5.0000 mg | Freq: Once | INTRAMUSCULAR | Status: DC | PRN

## 2020-10-04 MED ORDER — MORPHINE SULFATE (PF) 0.5 MG/ML IJ SOLN
INTRAMUSCULAR | Status: AC
  Filled 2020-10-04: qty 10

## 2020-10-04 MED ORDER — OXYCODONE-ACETAMINOPHEN 5-325 MG PO TABS
1.0000 | ORAL_TABLET | ORAL | Status: DC | PRN
Start: 2020-10-05 — End: 2020-10-07
  Administered 2020-10-05 (×2): 1 via ORAL
  Filled 2020-10-04 (×2): qty 1

## 2020-10-04 MED ORDER — FERROUS SULFATE 325 (65 FE) MG PO TABS
325.0000 mg | ORAL_TABLET | Freq: Two times a day (BID) | ORAL | Status: DC
  Administered 2020-10-05 – 2020-10-07 (×5): 325 mg via ORAL
  Filled 2020-10-04 (×6): qty 1

## 2020-10-04 MED ORDER — NITROGLYCERIN IN D5W 200-5 MCG/ML-% IV SOLN
INTRAVENOUS | Status: AC
  Filled 2020-10-04: qty 250

## 2020-10-04 MED ORDER — KETOROLAC TROMETHAMINE 30 MG/ML IJ SOLN
30.0000 mg | Freq: Four times a day (QID) | INTRAMUSCULAR | Status: AC | PRN
  Filled 2020-10-04: qty 1

## 2020-10-04 MED ORDER — FENTANYL CITRATE (PF) 100 MCG/2ML IJ SOLN
INTRAMUSCULAR | Status: AC
  Filled 2020-10-04: qty 2

## 2020-10-04 MED ORDER — PHENYLEPHRINE HCL (PRESSORS) 10 MG/ML IV SOLN
INTRAVENOUS | Status: DC | PRN
  Administered 2020-10-04 (×2): 100 ug via INTRAVENOUS
  Administered 2020-10-04: 50 ug via INTRAVENOUS
  Administered 2020-10-04: 100 ug via INTRAVENOUS

## 2020-10-04 MED ORDER — OXYTOCIN-SODIUM CHLORIDE 30-0.9 UT/500ML-% IV SOLN
INTRAVENOUS | Status: DC | PRN
  Administered 2020-10-04: 300 mL via INTRAVENOUS
  Administered 2020-10-04: 41.7 mL via INTRAVENOUS

## 2020-10-04 MED ORDER — NALOXONE HCL 0.4 MG/ML IJ SOLN: 0.4000 mg | INTRAMUSCULAR | Status: DC | PRN

## 2020-10-04 MED ORDER — DIBUCAINE (PERIANAL) 1 % EX OINT: 1.0000 "application " | TOPICAL_OINTMENT | CUTANEOUS | Status: DC | PRN

## 2020-10-04 MED ORDER — KETOROLAC TROMETHAMINE 30 MG/ML IJ SOLN: 30.0000 mg | Freq: Four times a day (QID) | INTRAMUSCULAR | Status: AC | PRN

## 2020-10-04 SURGICAL SUPPLY — 33 items
CATH KIT ON-Q SILVERSOAK 5IN (CATHETERS) ×4 IMPLANT
DERMABOND ADVANCED (GAUZE/BANDAGES/DRESSINGS) ×1
DERMABOND ADVANCED .7 DNX12 (GAUZE/BANDAGES/DRESSINGS) ×1 IMPLANT
DRSG OPSITE POSTOP 4X10 (GAUZE/BANDAGES/DRESSINGS) ×2 IMPLANT
DRSG TELFA 3X8 NADH (GAUZE/BANDAGES/DRESSINGS) ×2 IMPLANT
ELECT CAUTERY BLADE 6.4 (BLADE) ×2 IMPLANT
ELECT REM PT RETURN 9FT ADLT (ELECTROSURGICAL) ×2
ELECTRODE REM PT RTRN 9FT ADLT (ELECTROSURGICAL) ×1 IMPLANT
GAUZE SPONGE 4X4 12PLY STRL (GAUZE/BANDAGES/DRESSINGS) ×2 IMPLANT
GLOVE SURG ENC MOIS LTX SZ7 (GLOVE) ×2 IMPLANT
GLOVE SURG UNDER LTX SZ7.5 (GLOVE) ×2 IMPLANT
GOWN STRL REUS W/ TWL LRG LVL3 (GOWN DISPOSABLE) ×3 IMPLANT
GOWN STRL REUS W/TWL LRG LVL3 (GOWN DISPOSABLE) ×3
MANIFOLD NEPTUNE II (INSTRUMENTS) ×2 IMPLANT
MAT PREVALON FULL STRYKER (MISCELLANEOUS) ×2 IMPLANT
NS IRRIG 1000ML POUR BTL (IV SOLUTION) ×2 IMPLANT
PACK C SECTION AR (MISCELLANEOUS) ×2 IMPLANT
PAD OB MATERNITY 4.3X12.25 (PERSONAL CARE ITEMS) ×4 IMPLANT
PAD PREP 24X41 OB/GYN DISP (PERSONAL CARE ITEMS) ×2 IMPLANT
PENCIL SMOKE EVACUATOR (MISCELLANEOUS) ×2 IMPLANT
SCRUB EXIDINE 4% CHG 4OZ (MISCELLANEOUS) ×2 IMPLANT
STRIP CLOSURE SKIN 1/2X4 (GAUZE/BANDAGES/DRESSINGS) ×2 IMPLANT
SUT MNCRL 4-0 (SUTURE) ×1
SUT MNCRL 4-0 27XMFL (SUTURE) ×1
SUT PDS AB 1 TP1 96 (SUTURE) ×2 IMPLANT
SUT PLAIN GUT 0 (SUTURE) IMPLANT
SUT VIC AB 0 CT1 27 (SUTURE) ×1
SUT VIC AB 0 CT1 27XCR 8 STRN (SUTURE) ×1 IMPLANT
SUT VIC AB 0 CTX 36 (SUTURE) ×2
SUT VIC AB 0 CTX36XBRD ANBCTRL (SUTURE) ×2 IMPLANT
SUTURE MNCRL 4-0 27XMF (SUTURE) ×1 IMPLANT
SWABSTK COMLB BENZOIN TINCTURE (MISCELLANEOUS) ×2 IMPLANT
WATER STERILE IRR 500ML POUR (IV SOLUTION) ×2 IMPLANT

## 2020-10-04 NOTE — Transfer of Care (Signed)
Immediate Anesthesia Transfer of Care Note  Patient: Maria Richmond  Procedure(s) Performed: CESAREAN SECTION  Patient Location: LDR6  Anesthesia Type:Spinal  Level of Consciousness: awake, alert  and oriented  Airway & Oxygen Therapy: Patient Spontanous Breathing  Post-op Assessment: Report given to RN and Post -op Vital signs reviewed and stable  Post vital signs: Reviewed and stable  Last Vitals:  Vitals Value Taken Time  BP 108/62   Temp    Pulse 73 10/04/20 0938  Resp 22 10/04/20 0938  SpO2 100 % 10/04/20 0938  Vitals shown include unvalidated device data.  Last Pain:  Vitals:   10/04/20 0701  TempSrc: Oral  PainSc: 0-No pain         Complications: No notable events documented.

## 2020-10-04 NOTE — Interval H&P Note (Signed)
History and Physical Interval Note:  10/04/2020 7:26 AM  Maria Richmond  has presented today for surgery, with the diagnosis of repeat c/s.  The various methods of treatment have been discussed with the patient and family. After consideration of risks, benefits and other options for treatment, the patient has consented to  Procedure(s): CESAREAN SECTION (N/A) as a surgical intervention.  The patient's history has been reviewed, patient examined, no change in status, stable for surgery.  I have reviewed the patient's chart and labs.  Questions were answered to the patient's satisfaction.     Thomasene Mohair, MD, [facility], [facility] 10/04/2020 7:27 AM

## 2020-10-04 NOTE — Lactation Note (Signed)
This note was copied from a baby's chart. Lactation Consultation Note  Patient Name: Maria Richmond [id number] Date: 10/04/2020 Reason for consult: Initial assessment;Early term 37-38.6wks;Infant < 6lbs;Other (Comment) (repeat c-section) Age:25 hours  Initial lactation visit. Mom is P3 w/ repeat c-section <4hrs ago. Mom has limited breastfeeding experience, stating that her other 2 children did not latch. Mom attempting a feeding with LC present, but leaning over baby with baby in football hold, using pillow as support instead of arms. Mom accepts [facility] assistance. LC repositions baby so he is breast level, positions mom's arm behind baby's back and head, and sandwiches the tissue. Mom's R nipple is partially inverted, but baby does latch well with a light suck pattern for about 5 minutes.  LC educated mom on newborn and BF basics: encouraged feedings every 3 hrs based on baby's size/age, hand expression and spoon feeding colostrum post feedings if needed, and potential need for supplementation per protocol, but feedings will be monitored before this is implemented.  Reviewed early cues, positioning/alignment, sandwiching for deep latch, and hand expression.  Whiteboard updated. Encouraged mom to call for assistance at next feeding attempt.  Maternal Data Has patient been taught Hand Expression?: Yes Does the patient have breastfeeding experience prior to this delivery?: Yes How long did the patient breastfeed?: limited  Feeding Mother's Current Feeding Choice: Breast Milk  LATCH Score Latch: Repeated attempts needed to sustain latch, nipple held in mouth throughout feeding, stimulation needed to elicit sucking reflex.  Audible Swallowing: A few with stimulation  Type of Nipple: Everted at rest and after stimulation  Comfort (Breast/Nipple): Soft / non-tender  Hold (Positioning): Assistance needed to correctly position infant at breast and maintain latch.  LATCH Score: 7   Lactation  Tools Discussed/Used    Interventions Interventions: Assisted with latch;Hand express;Education;Support pillows;Adjust position;Breast feeding basics reviewed  Discharge    Consult Status Consult Status: Follow-up Date: 10/04/20 Follow-up type: In-patient    Danford Bad 10/04/2020, 2:47 PM

## 2020-10-04 NOTE — Discharge Summary (Signed)
Postpartum Discharge Summary    Patient Name: Maria Richmond DOB: 01/13/1995 MRN: [id number]  Date of admission: 10/04/2020 Delivery date:10/04/2020  Delivering provider: JACKSON, STEPHEN D  Date of discharge: 10/07/2020  Admitting diagnosis: History of cesarean delivery [Z98.891] Intrauterine pregnancy: [redacted]w[redacted]d     Secondary diagnosis:  Principal Problem:   Supervision of high-risk pregnancy, third trimester Active Problems:   History of classical cesarean section   History of placental abruption   History of preterm delivery   Anxiety and depression   History of cesarean delivery   [redacted] weeks gestation of pregnancy Postpartum Preeclampsia Additional problems:     Discharge diagnosis: Term Pregnancy Delivered                                              Post partum procedures: none Augmentation: N/A Complications: None  Hospital course: Sceduled C/S   25 y.o. yo [id number] at [redacted]w[redacted]d was admitted to the hospital 10/04/2020 for scheduled cesarean section with the following indication: history of classical cesarean .Delivery details are as follows:  Membrane Rupture Time/Date: 8:37 AM ,10/04/2020   Delivery Method:C-Section, Low Vertical  Details of operation can be found in separate operative note.  Patient had an uncomplicated postpartum course.  She is ambulating, tolerating a regular diet, passing flatus, and urinating well. Patient is discharged home in stable condition on  10/07/20        Newborn Data: Birth date:10/04/2020  Birth time:8:37 AM  Gender:Female  Living status:Living  Apgars:9 ,9  Weight:2470 g     Magnesium Sulfate received: No BMZ received: No Rhophylac:N/A MMR:Yes T-DaP: declined Flu: No Transfusion:No  Physical exam  Vitals:   10/06/20 1542 10/07/20 0012 10/07/20 0748 10/07/20 1005  BP: 136/85 135/85 127/90 115/69  Pulse: 89 92 79   Resp: 20 20 20   Temp: 98.7 F (37.1 C) 98.7 F (37.1 C) 98.4 F (36.9 C)   TempSrc: Oral Oral Oral   SpO2:  99%  100%   Weight:      Height:       General: alert and cooperative Lochia: appropriate Uterine Fundus: firm Incision: Healing well with no significant drainage DVT Evaluation: No evidence of DVT seen on physical exam. Labs: Lab Results  Component Value Date   WBC 8.3 10/07/2020   HGB 10.9 (L) 10/07/2020   HCT 33.5 (L) 10/07/2020   MCV 90.8 10/07/2020   PLT 162 10/07/2020   CMP Latest Ref Rng & Units 10/07/2020  Glucose 70 - 99 mg/dL 79  BUN 6 - 20 mg/dL 15  Creatinine 0.44 - 1.00 mg/dL 0.75  Sodium 135 - 145 mmol/L 138  Potassium 3.5 - 5.1 mmol/L 4.2  Chloride 98 - 111 mmol/L 102  CO2 22 - 32 mmol/L 26  Calcium 8.9 - 10.3 mg/dL 9.3  Total Protein 6.5 - 8.1 g/dL 6.3(L)  Total Bilirubin 0.3 - 1.2 mg/dL 0.5  Alkaline Phos 38 - 126 U/L 65  AST 15 - 41 U/L 20  ALT 0 - 44 U/L 12   Edinburgh Score: Edinburgh Postnatal Depression Scale Screening Tool 10/05/2020  I have been able to laugh and see the funny side of things. 1  I have looked forward with enjoyment to things. 2  I have blamed myself unnecessarily when things went wrong. 3  I have been anxious or worried for no good reason. 3  I have   Postpartum Discharge Summary    Patient Name: Maria Richmond DOB: 1995-08-19 MRN: [id number]  Date of admission: 10/04/2020 Delivery date:10/04/2020  Delivering provider: Prentice Docker D  Date of discharge: 10/07/2020  Admitting diagnosis: History of cesarean delivery [Z98.891] Intrauterine pregnancy: [redacted]w[redacted]d    Secondary diagnosis:  Principal Problem:   Supervision of high-risk pregnancy, third trimester Active Problems:   History of classical cesarean section   History of placental abruption   History of preterm delivery   Anxiety and depression   History of cesarean delivery   [redacted] weeks gestation of pregnancy Postpartum Preeclampsia Additional problems:     Discharge diagnosis: Term Pregnancy Delivered                                              Post partum procedures: none Augmentation: N/A Complications: None  Hospital course: Sceduled C/S   25y.o. yo [id number] admitted to the hospital 10/04/2020 for scheduled cesarean section with the following indication: history of classical cesarean .Delivery details are as follows:  Membrane Rupture Time/Date: 8:37 AM ,10/04/2020   Delivery Method:C-Section, Low Vertical  Details of operation can be found in separate operative note.  Patient had an uncomplicated postpartum course.  She is ambulating, tolerating a regular diet, passing flatus, and urinating well. Patient is discharged home in stable condition on  10/07/20        Newborn Data: Birth date:10/04/2020  Birth time:8:37 AM  Gender:Female  Living status:Living  Apgars:9 ,9  Weight:2470 g     Magnesium Sulfate received: No BMZ received: No Rhophylac:N/A MMR:Yes T-DaP: declined Flu: No Transfusion:No  Physical exam  Vitals:   10/06/20 1542 10/07/20 0012 10/07/20 0748 10/07/20 1005  BP: 136/85 135/85 127/90 115/69  Pulse: 89 92 79   Resp: _0 Temp: 98.7 F (37.1 C) 98.7 F (37.1 C) 98.4 F (36.9 C)   TempSrc: Oral Oral Oral   SpO2:  99%  100%   Weight:      Height:       General: alert and cooperative Lochia: appropriate Uterine Fundus: firm Incision: Healing well with no significant drainage DVT Evaluation: No evidence of DVT seen on physical exam. Labs: Lab Results  Component Value Date   WBC 8.3 10/07/2020   HGB 10.9 (L) 10/07/2020   HCT 33.5 (L) 10/07/2020   MCV 90.8 10/07/2020   PLT 162 10/07/2020   CMP Latest Ref Rng & Units 10/07/2020  Glucose 70 - 99 mg/dL 79  BUN 6 - 20 mg/dL 15  Creatinine 0.44 - 1.00 mg/dL 0.75  Sodium 135 - 145 mmol/L 138  Potassium 3.5 - 5.1 mmol/L 4.2  Chloride 98 - 111 mmol/L 102  CO2 22 - 32 mmol/L 26  Calcium 8.9 - 10.3 mg/dL 9.3  Total Protein 6.5 - 8.1 g/dL 6.3(L)  Total Bilirubin 0.3 - 1.2 mg/dL 0.5  Alkaline Phos 38 - 126 U/L 65  AST 15 - 41 U/L 20  ALT 0 - 44 U/L 12   Edinburgh Score: Edinburgh Postnatal Depression Scale Screening Tool 10/05/2020  I have been able to laugh and see the funny side of things. 1  I have looked forward with enjoyment to things. 2  I have blamed myself unnecessarily when things went wrong. 3  I have been anxious or worried for no good reason. 3  I have

## 2020-10-04 NOTE — Anesthesia Preprocedure Evaluation (Signed)
Anesthesia Evaluation  Patient identified by MRN, date of birth, ID band Patient awake    Reviewed: Allergy & Precautions, H&P , NPO status , Patient's Chart, lab work & pertinent test results, reviewed documented beta blocker date and time   History of Anesthesia Complications Negative for: history of anesthetic complications  Airway Mallampati: II  TM Distance: >3 FB Neck ROM: full    Dental  (+) Dental Advidsory Given, Teeth Intact   Pulmonary neg shortness of breath, neg recent URI, Current Smoker,    Pulmonary exam normal breath sounds clear to auscultation       Cardiovascular Exercise Tolerance: Good negative cardio ROS Normal cardiovascular exam Rhythm:regular Rate:Normal     Neuro/Psych PSYCHIATRIC DISORDERS Anxiety Depression negative neurological ROS     GI/Hepatic Neg liver ROS, GERD  ,  Endo/Other  negative endocrine ROS  Renal/GU negative Renal ROS  negative genitourinary   Musculoskeletal   Abdominal   Peds  Hematology negative hematology ROS (+)   Anesthesia Other Findings Past Medical History: No date: Depression No date: GERD (gastroesophageal reflux disease) No date: Medical history non-contributory No date: Preterm labor   Reproductive/Obstetrics negative OB ROS                             Anesthesia Physical Anesthesia Plan  ASA: 2  Anesthesia Plan: Spinal   Post-op Pain Management:    Induction:   PONV Risk Score and Plan:   Airway Management Planned: Natural Airway  Additional Equipment:   Intra-op Plan:   Post-operative Plan:   Informed Consent: I have reviewed the patients History and Physical, chart, labs and discussed the procedure including the risks, benefits and alternatives for the proposed anesthesia with the patient or authorized representative who has indicated his/her understanding and acceptance.     Dental Advisory Given  Plan  Discussed with: Anesthesiologist, CRNA and Surgeon  Anesthesia Plan Comments:         Anesthesia Quick Evaluation

## 2020-10-04 NOTE — Op Note (Signed)
Cesarean Section Operative Note    Patient Name: Maria Richmond  MRN: [id number]  Date of Surgery: 10/04/2020   Pre-operative Diagnosis:  1) History of classical and low transverse cesarean sections 2) intrauterine pregnancy at [redacted]w[redacted]d   Post-operative Diagnosis:  1) History of classical and low transverse cesarean sections 2) intrauterine pregnancy at 110w0d    Procedure: Repeat Low Transverse cesarean section via Pfannenstiel incision with double layer uterine closure  Surgeon: Surgeon(s) and Role:    Conard Novak, MD - Primary  Assistants: Dr. Lebron Conners; No other capable assistant available, in surgery requiring high level assistant.  Anesthesia: spinal   Findings:  1) normal appearing gravid uterus, fallopian tubes, and ovaries 2) mild adhesions of omentum and bladder to lower uterine segment and lower uterus   Quantified Blood Loss: 475 mL  Total IV Fluids: 1,200 ml   Urine Output:  75 mL clear urine at end of case  Specimens: none  Complications: no complications  Disposition: PACU - hemodynamically stable.   Maternal Condition: stable   Baby condition / location:  Couplet care / Skin to Skin  Procedure Details:  The patient was seen in the Holding Room. The risks, benefits, complications, treatment options, and expected outcomes were discussed with the patient. The patient concurred with the proposed plan, giving informed consent. identified as Maria Richmond and the procedure verified as C-Section Delivery. A Time Out was held and the above information confirmed.   After induction of anesthesia, the patient was draped and prepped in the usual sterile manner. A Pfannenstiel incision was made and carried down through the subcutaneous tissue to the fascia. Fascial incision was made and extended transversely. The fascia was separated from the underlying rectus tissue superiorly and inferiorly. The peritoneum was identified and entered. Peritoneal  incision was extended longitudinally. The bladder flap was bluntly and sharply freed from the lower uterine segment. A low transverse uterine incision was made and the hysterotomy was extended with cranial-caudal tension. Delivered from cephalic presentation was a 2.470 gram Living newborn infant(s) or Female with Apgar scores of 9 at one minute and 9 at five minutes. Cord ph was not sent the umbilical cord was clamped and cut cord blood was obtained for evaluation. The placenta was removed Intact and appeared normal. The uterine outline, tubes and ovaries appeared normal except as noted above. The uterine incision was closed with running locked sutures of 0 Vicryl.  A second layer of the same suture was thrown in an imbricating fashion.  Hemostasis was assured.  The uterus was returned to the abdomen and the paracolic gutters were cleared of all clots and debris.  The rectus muscles were inspected and found to be hemostatic.  The On-Q catheter pumps were inserted in accordance with the manufacturer's recommendations.  The catheters were inserted approximately 4cm cephelad to the incision line, approximately 1cm apart, straddling the midline.  They were inserted to a depth of the 4th mark. They were positioned superficial to the rectus abdominus muscles and deep to the rectus fascia.    The fascia was then reapproximated with running sutures of 1-0 PDS, looped. A running subcutaneous stitch with 3-0 vicryl was used to close dead space and reduce tension on the skin incision.  The subcuticular closure was performed using 4-0 monocryl. The skin closure was reinforced using surgical skin glue.  The On-Q catheters were bolused with 5 mL of 0.5% marcaine plain for a total of 10 mL.  The catheters were affixed  to the skin with surgical skin glue, steri-strips, and tegaderm.    The surgical assistant performed tissue retraction, assistance with suturing, and fundal pressure.     Instrument, sponge, and needle  counts were correct prior the abdominal closure and were correct at the conclusion of the case.  The patient received Ancef 2 gram IV prior to skin incision (within 30 minutes). For VTE prophylaxis she was wearing SCDs throughout the case.  The assistant surgeon was an MD due to lack of availability of another Sales promotion account executive.    Signed: Conard Novak, MD 10/04/2020 9:32 AM

## 2020-10-04 NOTE — Anesthesia Procedure Notes (Signed)
Spinal  Patient location during procedure: OR Start time: 10/04/2020 7:50 AM End time: 10/04/2020 8:09 AM Reason for block: surgical anesthesia Staffing Performed: anesthesiologist and resident/CRNA  Anesthesiologist: Lenard Simmer, MD Resident/CRNA: Irving Burton, CRNA Other anesthesia staff: Alanson Puls, RN Preanesthetic Checklist Completed: patient identified, IV checked, site marked, risks and benefits discussed, surgical consent, monitors and equipment checked, pre-op evaluation and timeout performed Spinal Block Patient position: sitting Prep: DuraPrep Patient monitoring: heart rate, cardiac monitor, continuous pulse ox and blood pressure Approach: midline Location: L2-3 Injection technique: single-shot Needle Needle type: Whitacre and Introducer  Needle gauge: 24 G Needle length: 9 cm Assessment Sensory level: T4 Events: CSF return

## 2020-10-05 LAB — CBC
HCT: 31.3 % — ABNORMAL LOW (ref 36.0–46.0)
Hemoglobin: 10.5 g/dL — ABNORMAL LOW (ref 12.0–15.0)
MCH: 29.8 pg (ref 26.0–34.0)
MCHC: 33.5 g/dL (ref 30.0–36.0)
MCV: 88.9 fL (ref 80.0–100.0)
Platelets: 135 10*3/uL — ABNORMAL LOW (ref 150–400)
RBC: 3.52 MIL/uL — ABNORMAL LOW (ref 3.87–5.11)
RDW: 12.7 % (ref 11.5–15.5)
WBC: 12.4 10*3/uL — ABNORMAL HIGH (ref 4.0–10.5)
nRBC: 0 % (ref 0.0–0.2)

## 2020-10-05 NOTE — Lactation Note (Signed)
This note was copied from a baby's chart. Lactation Consultation Note  Patient Name: Maria Richmond [id number] Date: 10/05/2020 Reason for consult: Follow-up assessment;Early term 37-38.6wks Age:25 hours  Maternal Data Has patient been taught Hand Expression?: Yes Does the patient have breastfeeding experience prior to this delivery?: Yes How long did the patient breastfeed?: while in hospital  Feeding Mother's Current Feeding Choice: Breast Milk Baby hasn't nursed since before circ, attempted at 1408 per mom, sleepy, baby unswaddled and awakened, placed in football hold on left breast, latched easily with a little assist with chin pressure to open mouth wider at the breast, nursing well with stimulation, occ. swallow   LATCH Score Latch: Grasps breast easily, tongue down, lips flanged, rhythmical sucking.  Audible Swallowing: A few with stimulation  Type of Nipple: Everted at rest and after stimulation  Comfort (Breast/Nipple): Soft / non-tender  Hold (Positioning): Assistance needed to correctly position infant at breast and maintain latch.  LATCH Score: 8   Lactation Tools Discussed/Used  White board updated  Interventions Interventions: Breast feeding basics reviewed;Assisted with latch;Hand express;Adjust position;Support pillows;Expressed milk;Education  Discharge Pump: Personal WIC Program: Yes  Consult Status Consult Status: PRN Date: 10/05/20 Follow-up type: In-patient    [name] 10/05/2020, 3:47 PM

## 2020-10-05 NOTE — Anesthesia Post-op Follow-up Note (Signed)
  Anesthesia Pain Follow-up Note  Patient: Maria Richmond  Day #: 1  Date of Follow-up: 10/05/2020 Time: 10:27 AM  Last Vitals:  Vitals:   10/05/20 0740 10/05/20 0830  BP:  126/84  Pulse:  70  Resp:  20  Temp:  36.9 C  SpO2: 99% 99%    Level of Consciousness: alert  Pain: none   Side Effects:None  Catheter Site Exam:clean, dry, no drainage     Plan: D/C from anesthesia care at surgeon's request  Destiny Hagin

## 2020-10-05 NOTE — Anesthesia Postprocedure Evaluation (Signed)
Anesthesia Post Note  Patient: Maria Richmond  Procedure(s) Performed: CESAREAN SECTION  Patient location during evaluation: Mother Baby Anesthesia Type: Spinal Level of consciousness: oriented and awake and alert Pain management: pain level controlled Vital Signs Assessment: post-procedure vital signs reviewed and stable Respiratory status: spontaneous breathing and respiratory function stable Cardiovascular status: blood pressure returned to baseline and stable Postop Assessment: no headache, no backache, no apparent nausea or vomiting and able to ambulate Anesthetic complications: no   No notable events documented.   Last Vitals:  Vitals:   10/05/20 0740 10/05/20 0830  BP:  126/84  Pulse:  70  Resp:  20  Temp:  36.9 C  SpO2: 99% 99%    Last Pain:  Vitals:   10/05/20 0830  TempSrc: Oral  PainSc:                  Jeanine Luz

## 2020-10-05 NOTE — Progress Notes (Signed)
Subjective: Postpartum Day 1: Cesarean Delivery Patient reports incisional pain, tolerating PO, and no problems voiding.    Objective: Vital signs in last 24 hours: Temp:  [97.7 F (36.5 C)-98.8 F (37.1 C)] 98.5 F (36.9 C) (09/30 0830) Pulse Rate:  [70-86] 70 (09/30 0830) Resp:  [16-20] 20 (09/30 0830) BP: (109-132)/(72-84) 126/84 (09/30 0830) SpO2:  [97 %-100 %] 99 % (09/30 0830)  Physical Exam:  General: alert, cooperative, and no distress Lochia: appropriate Uterine Fundus: firm Incision: healing well, no significant drainage DVT Evaluation: No evidence of DVT seen on physical exam. Negative Homan's sign.  Recent Labs    10/05/20 0437  HGB 10.5*  HCT 31.3*    Assessment/Plan: Status post Cesarean section. Doing well postoperatively.  Continue current care.  Mirna Mires 10/05/2020, 11:14 AM

## 2020-10-06 MED ORDER — METOCLOPRAMIDE HCL 10 MG PO TABS
10.0000 mg | ORAL_TABLET | Freq: Three times a day (TID) | ORAL | Status: DC
  Administered 2020-10-06 – 2020-10-07 (×4): 10 mg via ORAL
  Filled 2020-10-06 (×6): qty 1

## 2020-10-06 MED ORDER — TETANUS-DIPHTH-ACELL PERTUSSIS 5-2.5-18.5 LF-MCG/0.5 IM SUSY: 0.5000 mL | PREFILLED_SYRINGE | Freq: Once | INTRAMUSCULAR | Status: DC

## 2020-10-06 MED ORDER — ESCITALOPRAM OXALATE 10 MG PO TABS
10.0000 mg | ORAL_TABLET | Freq: Every day | ORAL | Status: DC
  Administered 2020-10-06 – 2020-10-07 (×2): 10 mg via ORAL
  Filled 2020-10-06 (×2): qty 1

## 2020-10-06 NOTE — Progress Notes (Signed)
  Subjective:   She reports that she was up a lot over night with her infant. She has not yet passed flatus. She has ambulated. She is tolerating a regular diet. Pain is controlled. She reports that she feels her mood is related to little sleep. She was on lexapro in the past and would liek to restart that medication  Objective:  Blood pressure 106/71, pulse 76, temperature 97.7 F (36.5 C), temperature source Oral, resp. rate 18, height 5\' 2"  (1.575 m), weight 85.3 kg, last menstrual period 01/19/2020, SpO2 100 %, unknown if currently breastfeeding.  General: NAD Pulmonary: no increased work of breathing Abdomen: non-distended, non-tender, fundus firm at level of umbilicus. Normal bowel sounds. Incision: clean, dry, intact Extremities: no edema, no erythema, no tenderness  Results for orders placed or performed during the hospital encounter of 10/04/20 (from the past 72 hour(s))  CBC     Status: Abnormal   Collection Time: 10/05/20  4:37 AM  Result Value Ref Range   WBC 12.4 (H) 4.0 - 10.5 K/uL   RBC 3.52 (L) 3.87 - 5.11 MIL/uL   Hemoglobin 10.5 (L) 12.0 - 15.0 g/dL   HCT 10/07/20 (L) 81.8 - 29.9 %   MCV 88.9 80.0 - 100.0 fL   MCH 29.8 26.0 - 34.0 pg   MCHC 33.5 30.0 - 36.0 g/dL   RDW 37.1 69.6 - 78.9 %   Platelets 135 (L) 150 - 400 K/uL   nRBC 0.0 0.0 - 0.2 %    Comment: Performed at [facility], [address] [place], [place]     Assessment:   25 y.o. 22 postoperativeday # 2   Plan:  1) Acute blood loss anemia - hemodynamically stable and asymptomatic - po ferrous sulfate  2) Blood Type --/--/O POS (09/27 1044)   3) Rubella <0.90 (05/06 1051) / Varicella non-immune. Immunizations ordered, patient declines  4) TDAP status - not given prenatally, ordered There is no immunization history for the selected administration types on file for this patient.  5) Feeding- breast with formula supplement   6) Contraception - Desires Nexplanon at time of  follow up  7) Postpartum depression- will restart Lexapro  8) No flatus, will start reglan.   9) Disposition - Continue care

## 2020-10-06 NOTE — Progress Notes (Signed)
Patient off floor. Walked down to front entrance with significant other to pick up food. RN discussed safety with patient. Patient verbalizes understanding.

## 2020-10-06 NOTE — Progress Notes (Signed)
Patient back to room.

## 2020-10-06 NOTE — Clinical Social Work Maternal (Signed)
CLINICAL SOCIAL WORK MATERNAL/CHILD NOTE  Patient Details  Name: Maria Richmond MRN: [id number] Date of Birth: 12/05/1995  Date:  10/06/2020  Clinical Social Worker Initiating Note:  Aubrianne Molyneux Date/Time: Initiated:  10/06/20/      Child's Name:  Maria Richmond   Biological Parents:  Mother, Father   Need for Interpreter:  None   Reason for Referral:  Behavioral Health Concerns   Address:  [address] [place] [address]    Phone number:  [phone] (home)     Additional phone number: N/A  Household Members/Support Persons (HM/SP):   Household Member/Support Person 1, Household Member/Support Person 2, Household Member/Support Person 3   HM/SP Name Relationship DOB or Age  HM/SP -1   grandfather    HM/SP -2   child 4 years  HM/SP -3   child 2 years  HM/SP -4        HM/SP -5        HM/SP -6        HM/SP -7        HM/SP -8          Natural Supports (not living in the home):  Immediate Family, Friends, Spouse/significant other   Professional Supports:     Employment:     Type of Work:     Education:      Homebound arranged:    Architect:      Other Resources:      Cultural/Religious Considerations Which May Impact Care:  None reported  Strengths:  Ability to meet basic needs  , Compliance with medical plan  , Home prepared for child     Psychotropic Medications:         Pediatrician:       Pediatrician List:   [facility]      Pediatrician Fax Number:    Risk Factors/Current Problems:      Cognitive State:  Able to Concentrate  , Alert     Mood/Affect:  Calm  , Happy     CSW Assessment: CSW received a consult for MOB Edinburgh Score of 21.  CSW spoke with RN Skyler prior to meeting with MOB. Per RN, no additional concerns. MOB is appropriate.   Explained CSW's role and reason for referral.  MOB reported she is feeling  ok post delivery. MOB was alert/appropriate during assessment.   MOB and Baby will be living with MOB's grandfather and 2 other children - ages 72 & 2 - at discharge.   MOB reported she is still deciding on which Pediatrician to use, notified RN. MOB reported she has a all items needed for Baby other than a crib/bassinet/pack and play for Baby to sleep in. CSW educated MOB on importance of safe sleep and obtaining a safe place for Baby to sleep prior to discharge. She verbalized understanding. Also informed RN of this.   MOB reported she has reliable transportation for herself and Baby.   MOB reported she has a history of depression. MOB took Lexapro in the past, which has been restarted by MD today. MOB stated [facility] and can reach out to them if she needs counseling support. MOB reported she has a good support system and is coping well emotionally at this time. MOB denied SI or HI - currently or during  pregnancy. MOB denied the need for mental health support resources at this time, reported she is aware of resources if needed.  CSW printed education and information sheets on PPD and SIDS. MOB verbalized understanding. CSW ecouraged MOB to reach out to her Provider with any questions or needs for support or resources, even after discharge.   MOB denied any needs or questions at this time. CSW encouraged MOB to reach out if any arise prior to discharge.   Please re consult CSW if any additional needs or concerns arise.  CSW Plan/Description:  No Further Intervention Required/No Barriers to Discharge, Sudden Infant Death Syndrome (SIDS) Education, Perinatal Mood and Anxiety Disorder (PMADs) Education, Other Patient/Family Education    Liliana Cline, LCSW 10/06/2020, 4:49 PM

## 2020-10-07 LAB — CBC
HCT: 33.5 % — ABNORMAL LOW (ref 36.0–46.0)
Hemoglobin: 10.9 g/dL — ABNORMAL LOW (ref 12.0–15.0)
MCH: 29.5 pg (ref 26.0–34.0)
MCHC: 32.5 g/dL (ref 30.0–36.0)
MCV: 90.8 fL (ref 80.0–100.0)
Platelets: 162 10*3/uL (ref 150–400)
RBC: 3.69 MIL/uL — ABNORMAL LOW (ref 3.87–5.11)
RDW: 13.1 % (ref 11.5–15.5)
WBC: 8.3 10*3/uL (ref 4.0–10.5)
nRBC: 0 % (ref 0.0–0.2)

## 2020-10-07 LAB — COMPREHENSIVE METABOLIC PANEL
ALT: 12 U/L (ref 0–44)
AST: 20 U/L (ref 15–41)
Albumin: 2.8 g/dL — ABNORMAL LOW (ref 3.5–5.0)
Alkaline Phosphatase: 65 U/L (ref 38–126)
Anion gap: 10 (ref 5–15)
BUN: 15 mg/dL (ref 6–20)
CO2: 26 mmol/L (ref 22–32)
Calcium: 9.3 mg/dL (ref 8.9–10.3)
Chloride: 102 mmol/L (ref 98–111)
Creatinine, Ser: 0.75 mg/dL (ref 0.44–1.00)
GFR, Estimated: >60 mL/min (ref >60)
Glucose, Bld: 79 mg/dL (ref 70–99)
Potassium: 4.2 mmol/L (ref 3.5–5.1)
Sodium: 138 mmol/L (ref 135–145)
Total Bilirubin: 0.5 mg/dL (ref 0.3–1.2)
Total Protein: 6.3 g/dL — ABNORMAL LOW (ref 6.5–8.1)

## 2020-10-07 LAB — PROTEIN / CREATININE RATIO, URINE
Creatinine, Urine: 102 mg/dL
Protein Creatinine Ratio: 0.81 mg/mg{Cre} — ABNORMAL HIGH (ref 0.00–0.15)
Total Protein, Urine: 83 mg/dL

## 2020-10-07 MED ORDER — LABETALOL HCL 200 MG PO TABS: 200.0000 mg | ORAL_TABLET | Freq: Two times a day (BID) | ORAL | 0 refills | Status: DC

## 2020-10-07 MED ORDER — LABETALOL HCL 200 MG PO TABS
200.0000 mg | ORAL_TABLET | Freq: Two times a day (BID) | ORAL | Status: DC
  Administered 2020-10-07: 200 mg via ORAL
  Filled 2020-10-07: qty 1

## 2020-10-07 MED ORDER — DOCUSATE SODIUM 100 MG PO CAPS
100.0000 mg | ORAL_CAPSULE | Freq: Two times a day (BID) | ORAL | 0 refills | Status: DC | PRN
Start: 2020-10-07 — End: 2021-06-23

## 2020-10-07 MED ORDER — IBUPROFEN 600 MG PO TABS: 600.0000 mg | ORAL_TABLET | Freq: Four times a day (QID) | ORAL | 0 refills | Status: DC

## 2020-10-07 MED ORDER — ACETAMINOPHEN 500 MG PO TABS: 1000.0000 mg | ORAL_TABLET | Freq: Four times a day (QID) | ORAL | 0 refills | Status: DC | PRN

## 2020-10-07 MED ORDER — ESCITALOPRAM OXALATE 10 MG PO TABS: 10.0000 mg | ORAL_TABLET | Freq: Every day | ORAL | 3 refills | Status: DC

## 2020-10-07 MED ORDER — OXYCODONE HCL 5 MG PO TABS: 5.0000 mg | ORAL_TABLET | Freq: Three times a day (TID) | ORAL | 0 refills | Status: DC | PRN

## 2020-10-07 NOTE — Progress Notes (Signed)
Patient d/c home with infant. D/c instructions, Rx, and f/u appt given to and reviewed with pt. Pt verbalized understanding. Escorted out by auxillary.   

## 2020-10-07 NOTE — Progress Notes (Signed)
  Subjective:   She is feeling well. She reports her pain is controlled. She is breast feeding. Reports minimal uterine bleeding.   Objective:  Blood pressure 135/85, pulse 92, temperature 98.7 F (37.1 C), temperature source Oral, resp. rate 20, height 5\' 2"  (1.575 m), weight 85.3 kg, last menstrual period 01/19/2020, SpO2 99 %, unknown if currently breastfeeding.  General: NAD Pulmonary: no increased work of breathing Abdomen: non-distended, non-tender, fundus firm at level of umbilicus Incision: clean, dry, intact, On-q pump removed Extremities: no edema, no erythema, no tenderness  Results for orders placed or performed during the hospital encounter of 10/04/20 (from the past 72 hour(s))  CBC     Status: Abnormal   Collection Time: 10/05/20  4:37 AM  Result Value Ref Range   WBC 12.4 (H) 4.0 - 10.5 K/uL   RBC 3.52 (L) 3.87 - 5.11 MIL/uL   Hemoglobin 10.5 (L) 12.0 - 15.0 g/dL   HCT 10/07/20 (L) 16.1 - 09.6 %   MCV 88.9 80.0 - 100.0 fL   MCH 29.8 26.0 - 34.0 pg   MCHC 33.5 30.0 - 36.0 g/dL   RDW 04.5 40.9 - 81.1 %   Platelets 135 (L) 150 - 400 K/uL   nRBC 0.0 0.0 - 0.2 %    Comment: Performed at [facility], [address], [place], [place]     Assessment:   25 y.o. 22 postoperativeday # 3   Plan:  1) Acute blood loss anemia - hemodynamically stable and asymptomatic - po ferrous sulfate  2) Blood Type --/--/O POS (09/27 1044)   3) Rubella <0.90 (05/06 1051) / Varicella non-immune  4) TDAP status - not given prenatally There is no immunization history for the selected administration types on file for this patient.  5) Feeding- breast with formula supplement   6) Contraception - Nexplanon at time of follow up  7) Elevated BP, will start labetalol 200 mg BID. Will check labs. Plan outpatient follow up.  8)  Disposition- anticipate discharge home later today  10-10-1982 MD, Adelene Idler [facility], [facility] 10/07/2020 6:51 AM

## 2020-10-11 ENCOUNTER — Ambulatory Visit: Payer: Medicaid Other | Admitting: Obstetrics and Gynecology

## 2020-10-29 ENCOUNTER — Emergency Department: Payer: Medicaid Other

## 2020-10-29 ENCOUNTER — Other Ambulatory Visit: Payer: Self-pay

## 2020-10-29 ENCOUNTER — Emergency Department
Admission: EM | Admit: 2020-10-29 | Discharge: 2020-10-29 | Disposition: A | Discharge status: Home / Self Care | Payer: Medicaid Other | Attending: Emergency Medicine | Admitting: Emergency Medicine

## 2020-10-29 DIAGNOSIS — G8918 Other acute postprocedural pain: Secondary | ICD-10-CM | POA: Insufficient documentation

## 2020-10-29 DIAGNOSIS — R1031 Right lower quadrant pain: Secondary | ICD-10-CM | POA: Diagnosis present

## 2020-10-29 DIAGNOSIS — K219 Gastro-esophageal reflux disease without esophagitis: Secondary | ICD-10-CM | POA: Diagnosis not present

## 2020-10-29 DIAGNOSIS — F17210 Nicotine dependence, cigarettes, uncomplicated: Secondary | ICD-10-CM | POA: Insufficient documentation

## 2020-10-29 LAB — COMPREHENSIVE METABOLIC PANEL
ALT: 17 U/L (ref 0–44)
AST: 17 U/L (ref 15–41)
Albumin: 3.5 g/dL (ref 3.5–5.0)
Alkaline Phosphatase: 65 U/L (ref 38–126)
Anion gap: 5 (ref 5–15)
BUN: 13 mg/dL (ref 6–20)
CO2: 26 mmol/L (ref 22–32)
Calcium: 8.8 mg/dL — ABNORMAL LOW (ref 8.9–10.3)
Chloride: 106 mmol/L (ref 98–111)
Creatinine, Ser: 0.78 mg/dL (ref 0.44–1.00)
GFR, Estimated: >60 mL/min (ref >60)
Glucose, Bld: 95 mg/dL (ref 70–99)
Potassium: 3.9 mmol/L (ref 3.5–5.1)
Sodium: 137 mmol/L (ref 135–145)
Total Bilirubin: 0.3 mg/dL (ref 0.3–1.2)
Total Protein: 6.8 g/dL (ref 6.5–8.1)

## 2020-10-29 LAB — CBC WITH DIFFERENTIAL/PLATELET
Abs Immature Granulocytes: 0.01 10*3/uL (ref 0.00–0.07)
Basophils Absolute: 0 10*3/uL (ref 0.0–0.1)
Basophils Relative: 1 %
Eosinophils Absolute: 0.3 10*3/uL (ref 0.0–0.5)
Eosinophils Relative: 5 %
HCT: 38 % (ref 36.0–46.0)
Hemoglobin: 12.4 g/dL (ref 12.0–15.0)
Immature Granulocytes: 0 %
Lymphocytes Relative: 48 %
Lymphs Abs: 2.7 10*3/uL (ref 0.7–4.0)
MCH: 29.5 pg (ref 26.0–34.0)
MCHC: 32.6 g/dL (ref 30.0–36.0)
MCV: 90.3 fL (ref 80.0–100.0)
Monocytes Absolute: 0.5 10*3/uL (ref 0.1–1.0)
Monocytes Relative: 8 %
Neutro Abs: 2.2 10*3/uL (ref 1.7–7.7)
Neutrophils Relative %: 38 %
Platelets: 161 10*3/uL (ref 150–400)
RBC: 4.21 MIL/uL (ref 3.87–5.11)
RDW: 12.4 % (ref 11.5–15.5)
WBC: 5.8 10*3/uL (ref 4.0–10.5)
nRBC: 0 % (ref 0.0–0.2)

## 2020-10-29 LAB — URINALYSIS, ROUTINE W REFLEX MICROSCOPIC
Bacteria, UA: NONE SEEN
Bilirubin Urine: NEGATIVE
Glucose, UA: NEGATIVE mg/dL
Ketones, ur: NEGATIVE mg/dL
Leukocytes,Ua: NEGATIVE
Nitrite: NEGATIVE
Protein, ur: NEGATIVE mg/dL
Specific Gravity, Urine: 1.016 (ref 1.005–1.030)
pH: 6 (ref 5.0–8.0)

## 2020-10-29 LAB — POC URINE PREG, ED: Preg Test, Ur: NEGATIVE

## 2020-10-29 MED ORDER — IOHEXOL 350 MG/ML SOLN
80.0000 mL | Freq: Once | INTRAVENOUS | Status: AC | PRN
  Administered 2020-10-29: 80 mL via INTRAVENOUS
  Filled 2020-10-29: qty 80

## 2020-10-29 NOTE — Discharge Instructions (Signed)
No lifting of greater than 5 to 10 pounds.  Follow-up with Dr. Jean Rosenthal.

## 2020-10-29 NOTE — ED Notes (Signed)
See triage note  presents with lower abd pain and RLQ pain  states pain started about 1 week ago  has became worse  abd is soft but tender to mid and RLQ area
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
## 2020-10-29 NOTE — ED Triage Notes (Signed)
Pt come with c/o post op problem. Pt states c-section on the 29th on September. Pt states pain for about week. Pt denies any drainage.

## 2020-11-04 ENCOUNTER — Other Ambulatory Visit: Payer: Self-pay

## 2020-11-04 ENCOUNTER — Emergency Department
Admission: EM | Admit: 2020-11-04 | Discharge: 2020-11-04 | Disposition: A | Discharge status: Home / Self Care | Payer: Medicaid Other | Attending: Student in an Organized Health Care Education/Training Program | Admitting: Student in an Organized Health Care Education/Training Program

## 2020-11-04 DIAGNOSIS — J029 Acute pharyngitis, unspecified: Secondary | ICD-10-CM | POA: Diagnosis not present

## 2020-11-04 DIAGNOSIS — F17210 Nicotine dependence, cigarettes, uncomplicated: Secondary | ICD-10-CM | POA: Diagnosis not present

## 2020-11-04 LAB — GROUP A STREP BY PCR: Group A Strep by PCR: NOT DETECTED

## 2020-11-04 MED ORDER — AMOXICILLIN 500 MG PO CAPS: 500.0000 mg | ORAL_CAPSULE | Freq: Three times a day (TID) | ORAL | 0 refills | Status: DC

## 2020-11-04 MED ORDER — IBUPROFEN 800 MG PO TABS
800.0000 mg | ORAL_TABLET | Freq: Once | ORAL | Status: AC
  Administered 2020-11-04: 800 mg via ORAL
  Filled 2020-11-04: qty 1

## 2020-11-04 MED ORDER — AMOXICILLIN 200 MG/5ML PO SUSR: 500.0000 mg | Freq: Three times a day (TID) | ORAL | 0 refills | Status: AC

## 2020-11-04 MED ORDER — DEXAMETHASONE 6 MG PO TABS
10.0000 mg | ORAL_TABLET | Freq: Once | ORAL | Status: AC
  Administered 2020-11-04: 10 mg via ORAL
  Filled 2020-11-04: qty 1

## 2020-11-04 NOTE — ED Triage Notes (Signed)
Pt presents to ER c/o sore throat since yesterday.  Pt states it is painful to swallow.  Pt denies fever at home or being exposed to anyone sick.  No N/V.  Pt's tonsils appear red and swollen.

## 2020-11-04 NOTE — ED Provider Notes (Signed)
Emergency Medicine Provider Triage Evaluation Note  Maria Richmond , a 25 y.o. female  was evaluated in triage.  Pt complains of sore throat x1 day.  Review of Systems  Positive: Sore throat, dry cough Negative: Fever, vomiting, diarrhea  Physical Exam  BP 116/66 (BP Location: Right Arm)   Pulse 98   Temp 99.5 F (37.5 C) (Oral)   Resp 18   SpO2 97%  Gen:   Awake, no distress   Resp:  Normal effort  MSK:   Moves extremities without difficulty  Other:  Tonsillar hypertrophy without exudate, no uvular deviation or swelling, no trismus or drooling, normal phonation  Medical Decision Making  Medically screening exam initiated at 2:22 AM.  Appropriate orders placed.  Maria Richmond was informed that the remainder of the evaluation will be completed by another provider, this initial triage assessment does not replace that evaluation, and the importance of remaining in the ED until their evaluation is complete.  Strep test ordered.  Patient declines COVID and flu testing.  Will give ibuprofen, Decadron for symptomatic relief.   Jeslynn Hollander, Layla Maw, DO 11/04/20 [phone]
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
## 2020-11-26 ENCOUNTER — Ambulatory Visit: Payer: Self-pay

## 2020-12-19 ENCOUNTER — Other Ambulatory Visit: Payer: Self-pay

## 2020-12-19 ENCOUNTER — Emergency Department
Admission: EM | Admit: 2020-12-19 | Discharge: 2020-12-19 | Disposition: A | Discharge status: Home / Self Care | Payer: Medicaid Other | Attending: Emergency Medicine | Admitting: Emergency Medicine

## 2020-12-19 DIAGNOSIS — Z5321 Procedure and treatment not carried out due to patient leaving prior to being seen by health care provider: Secondary | ICD-10-CM | POA: Diagnosis not present

## 2020-12-19 DIAGNOSIS — R1110 Vomiting, unspecified: Secondary | ICD-10-CM | POA: Diagnosis present

## 2020-12-19 LAB — CBC
HCT: 40.7 % (ref 36.0–46.0)
Hemoglobin: 13.5 g/dL (ref 12.0–15.0)
MCH: 29.3 pg (ref 26.0–34.0)
MCHC: 33.2 g/dL (ref 30.0–36.0)
MCV: 88.5 fL (ref 80.0–100.0)
Platelets: 214 10*3/uL (ref 150–400)
RBC: 4.6 MIL/uL (ref 3.87–5.11)
RDW: 12.9 % (ref 11.5–15.5)
WBC: 7.2 10*3/uL (ref 4.0–10.5)
nRBC: 0 % (ref 0.0–0.2)

## 2020-12-19 LAB — COMPREHENSIVE METABOLIC PANEL
ALT: 25 U/L (ref 0–44)
AST: 23 U/L (ref 15–41)
Albumin: 3.8 g/dL (ref 3.5–5.0)
Alkaline Phosphatase: 71 U/L (ref 38–126)
Anion gap: 5 (ref 5–15)
BUN: 15 mg/dL (ref 6–20)
CO2: 26 mmol/L (ref 22–32)
Calcium: 8.8 mg/dL — ABNORMAL LOW (ref 8.9–10.3)
Chloride: 106 mmol/L (ref 98–111)
Creatinine, Ser: 0.67 mg/dL (ref 0.44–1.00)
GFR, Estimated: >60 mL/min (ref >60)
Glucose, Bld: 102 mg/dL — ABNORMAL HIGH (ref 70–99)
Potassium: 3.7 mmol/L (ref 3.5–5.1)
Sodium: 137 mmol/L (ref 135–145)
Total Bilirubin: 0.3 mg/dL (ref 0.3–1.2)
Total Protein: 6.5 g/dL (ref 6.5–8.1)

## 2020-12-19 LAB — LIPASE, BLOOD: Lipase: 42 U/L (ref 11–51)

## 2020-12-19 NOTE — ED Notes (Signed)
No answer when called several times from lobby 

## 2020-12-19 NOTE — ED Triage Notes (Signed)
Pt in with co vomiting x 4 after eating a burger earlier today. No other complaints.

## 2021-01-27 ENCOUNTER — Emergency Department
Admission: EM | Admit: 2021-01-27 | Discharge: 2021-01-27 | Disposition: A | Discharge status: Home / Self Care | Payer: No Typology Code available for payment source | Attending: Emergency Medicine | Admitting: Emergency Medicine

## 2021-01-27 ENCOUNTER — Emergency Department: Payer: No Typology Code available for payment source

## 2021-01-27 DIAGNOSIS — R4182 Altered mental status, unspecified: Secondary | ICD-10-CM | POA: Diagnosis present

## 2021-01-27 DIAGNOSIS — Z79899 Other long term (current) drug therapy: Secondary | ICD-10-CM | POA: Diagnosis not present

## 2021-01-27 DIAGNOSIS — J45909 Unspecified asthma, uncomplicated: Secondary | ICD-10-CM | POA: Insufficient documentation

## 2021-01-27 DIAGNOSIS — F10120 Alcohol abuse with intoxication, uncomplicated: Secondary | ICD-10-CM | POA: Diagnosis not present

## 2021-01-27 DIAGNOSIS — F10920 Alcohol use, unspecified with intoxication, uncomplicated: Secondary | ICD-10-CM

## 2021-01-27 DIAGNOSIS — Y907 Blood alcohol level of 200-239 mg/100 ml: Secondary | ICD-10-CM | POA: Insufficient documentation

## 2021-01-27 LAB — COMPREHENSIVE METABOLIC PANEL
ALT: 24 U/L (ref 0–44)
AST: 28 U/L (ref 15–41)
Albumin: 4.1 g/dL (ref 3.5–5.0)
Alkaline Phosphatase: 70 U/L (ref 38–126)
Anion gap: 10 (ref 5–15)
BUN: 16 mg/dL (ref 6–20)
CO2: 23 mmol/L (ref 22–32)
Calcium: 8.9 mg/dL (ref 8.9–10.3)
Chloride: 108 mmol/L (ref 98–111)
Creatinine, Ser: 0.89 mg/dL (ref 0.44–1.00)
GFR, Estimated: >60 mL/min (ref >60)
Glucose, Bld: 100 mg/dL — ABNORMAL HIGH (ref 70–99)
Potassium: 3.5 mmol/L (ref 3.5–5.1)
Sodium: 141 mmol/L (ref 135–145)
Total Bilirubin: 0.8 mg/dL (ref 0.3–1.2)
Total Protein: 7.5 g/dL (ref 6.5–8.1)

## 2021-01-27 LAB — CBC WITH DIFFERENTIAL/PLATELET
Abs Immature Granulocytes: 0.02 10*3/uL (ref 0.00–0.07)
Basophils Absolute: 0.1 10*3/uL (ref 0.0–0.1)
Basophils Relative: 0 %
Eosinophils Absolute: 0.3 10*3/uL (ref 0.0–0.5)
Eosinophils Relative: 3 %
HCT: 43.4 % (ref 36.0–46.0)
Hemoglobin: 14.4 g/dL (ref 12.0–15.0)
Immature Granulocytes: 0 %
Lymphocytes Relative: 38 %
Lymphs Abs: 4.4 10*3/uL — ABNORMAL HIGH (ref 0.7–4.0)
MCH: 29.1 pg (ref 26.0–34.0)
MCHC: 33.2 g/dL (ref 30.0–36.0)
MCV: 87.9 fL (ref 80.0–100.0)
Monocytes Absolute: 0.5 10*3/uL (ref 0.1–1.0)
Monocytes Relative: 4 %
Neutro Abs: 6.3 10*3/uL (ref 1.7–7.7)
Neutrophils Relative %: 55 %
Platelets: 226 10*3/uL (ref 150–400)
RBC: 4.94 MIL/uL (ref 3.87–5.11)
RDW: 13 % (ref 11.5–15.5)
WBC: 11.5 10*3/uL — ABNORMAL HIGH (ref 4.0–10.5)
nRBC: 0 % (ref 0.0–0.2)

## 2021-01-27 LAB — ETHANOL: Alcohol, Ethyl (B): 229 mg/dL — ABNORMAL HIGH (ref <10)

## 2021-01-27 LAB — LIPASE, BLOOD: Lipase: 36 U/L (ref 11–51)

## 2021-01-27 LAB — POC URINE PREG, ED: Preg Test, Ur: NEGATIVE

## 2021-01-27 MED ORDER — SODIUM CHLORIDE 0.9 % IV BOLUS
1000.0000 mL | Freq: Once | INTRAVENOUS | Status: AC
  Administered 2021-01-27: 1000 mL via INTRAVENOUS

## 2021-01-27 NOTE — Discharge Instructions (Signed)
Drink alcohol only in moderation.  Return to the ER for worsening symptoms, persistent vomiting, difficulty breathing or other concerns. 
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
## 2021-01-27 NOTE — ED Triage Notes (Signed)
Patient from home via EMS for difficulty in breathing and ETOH; EMS gave 2 mg IM of Versed for disruptive behavior; patient sleeping, respiration even non-labored; skin warm/ dry

## 2021-01-27 NOTE — ED Notes (Signed)
Patient demanded to leave; ambulated with steady gait; A/O x4; respirations even non-labored; skin warm/ dry

## 2021-02-16 ENCOUNTER — Other Ambulatory Visit: Payer: Self-pay

## 2021-02-16 ENCOUNTER — Emergency Department
Admission: EM | Admit: 2021-02-16 | Discharge: 2021-02-16 | Disposition: A | Discharge status: Home / Self Care | Payer: Medicaid Other | Attending: Emergency Medicine | Admitting: Emergency Medicine

## 2021-02-16 DIAGNOSIS — Z5321 Procedure and treatment not carried out due to patient leaving prior to being seen by health care provider: Secondary | ICD-10-CM | POA: Insufficient documentation

## 2021-02-16 DIAGNOSIS — R7309 Other abnormal glucose: Secondary | ICD-10-CM | POA: Diagnosis not present

## 2021-02-16 DIAGNOSIS — R350 Frequency of micturition: Secondary | ICD-10-CM | POA: Diagnosis not present

## 2021-02-16 LAB — URINALYSIS, ROUTINE W REFLEX MICROSCOPIC
Bilirubin Urine: NEGATIVE
Glucose, UA: NEGATIVE mg/dL
Ketones, ur: NEGATIVE mg/dL
Nitrite: NEGATIVE
Protein, ur: 30 mg/dL — AB
Specific Gravity, Urine: 1.023 (ref 1.005–1.030)
WBC, UA: >50 WBC/hpf — ABNORMAL HIGH (ref 0–5)
pH: 7 (ref 5.0–8.0)

## 2021-02-16 LAB — CBG MONITORING, ED: Glucose-Capillary: 135 mg/dL — ABNORMAL HIGH (ref 70–99)

## 2021-02-16 LAB — POC URINE PREG, ED: Preg Test, Ur: NEGATIVE

## 2021-02-16 NOTE — ED Triage Notes (Signed)
Pt complains of urinary frequency since last night. Pt denies known fever, nausea, back pain, dysuria.

## 2021-02-16 NOTE — ED Notes (Signed)
Pt verbalizes understanding of need for urine sample for testing. Supplies given to pt who verbalizes understanding of clean catch

## 2021-02-19 ENCOUNTER — Emergency Department
Admission: EM | Admit: 2021-02-19 | Discharge: 2021-02-19 | Disposition: A | Discharge status: Home / Self Care | Payer: Medicaid Other | Attending: Emergency Medicine | Admitting: Emergency Medicine

## 2021-02-19 DIAGNOSIS — M545 Low back pain, unspecified: Secondary | ICD-10-CM | POA: Diagnosis not present

## 2021-02-19 DIAGNOSIS — R110 Nausea: Secondary | ICD-10-CM | POA: Diagnosis not present

## 2021-02-19 DIAGNOSIS — Z5321 Procedure and treatment not carried out due to patient leaving prior to being seen by health care provider: Secondary | ICD-10-CM | POA: Diagnosis not present

## 2021-02-19 DIAGNOSIS — R6883 Chills (without fever): Secondary | ICD-10-CM | POA: Diagnosis not present

## 2021-02-19 LAB — URINE CULTURE: Culture: >=100000 — AB

## 2021-02-19 NOTE — ED Triage Notes (Signed)
First nurse note:  Pt to ED via EMS from home c/o covid symptoms since this morning, just returned from [place].  EMS reports nausea no vomiting, chills, lower back pain.  Vitals 138/98 BP, 117 HR, 98%, 100.1 temp.

## 2021-02-19 NOTE — ED Notes (Signed)
This tech called pt for triage. Pt did not answer. Registration saw pt leave out the front entrance.

## 2021-02-20 NOTE — Consult Note (Signed)
Pt was not seen by a doctor and left before could be seen. Pt does have a positive urine culture, but could be a possible contaminant. Sample was not a clean catch. Attempted to call patient to get more information, but phone was not in service.   Thanks,  Paschal Dopp, PharmD

## 2021-04-01 LAB — FETAL NONSTRESS TEST

## 2021-04-09 ENCOUNTER — Other Ambulatory Visit: Payer: Self-pay

## 2021-04-09 ENCOUNTER — Emergency Department
Admission: EM | Admit: 2021-04-09 | Discharge: 2021-04-09 | Disposition: A | Discharge status: Home / Self Care | Payer: Medicaid Other | Attending: Emergency Medicine | Admitting: Emergency Medicine

## 2021-04-09 DIAGNOSIS — R1013 Epigastric pain: Secondary | ICD-10-CM | POA: Diagnosis present

## 2021-04-09 DIAGNOSIS — R112 Nausea with vomiting, unspecified: Secondary | ICD-10-CM | POA: Diagnosis not present

## 2021-04-09 DIAGNOSIS — R197 Diarrhea, unspecified: Secondary | ICD-10-CM | POA: Diagnosis not present

## 2021-04-09 DIAGNOSIS — Z5321 Procedure and treatment not carried out due to patient leaving prior to being seen by health care provider: Secondary | ICD-10-CM | POA: Insufficient documentation

## 2021-04-09 LAB — COMPREHENSIVE METABOLIC PANEL
ALT: 24 U/L (ref 0–44)
AST: 23 U/L (ref 15–41)
Albumin: 4 g/dL (ref 3.5–5.0)
Alkaline Phosphatase: 64 U/L (ref 38–126)
Anion gap: 9 (ref 5–15)
BUN: 16 mg/dL (ref 6–20)
CO2: 24 mmol/L (ref 22–32)
Calcium: 9.1 mg/dL (ref 8.9–10.3)
Chloride: 105 mmol/L (ref 98–111)
Creatinine, Ser: 0.64 mg/dL (ref 0.44–1.00)
GFR, Estimated: >60 mL/min (ref >60)
Glucose, Bld: 98 mg/dL (ref 70–99)
Potassium: 4.2 mmol/L (ref 3.5–5.1)
Sodium: 138 mmol/L (ref 135–145)
Total Bilirubin: 0.7 mg/dL (ref 0.3–1.2)
Total Protein: 8.2 g/dL — ABNORMAL HIGH (ref 6.5–8.1)

## 2021-04-09 LAB — CBC
HCT: 43.6 % (ref 36.0–46.0)
Hemoglobin: 14 g/dL (ref 12.0–15.0)
MCH: 28.4 pg (ref 26.0–34.0)
MCHC: 32.1 g/dL (ref 30.0–36.0)
MCV: 88.4 fL (ref 80.0–100.0)
Platelets: 191 10*3/uL (ref 150–400)
RBC: 4.93 MIL/uL (ref 3.87–5.11)
RDW: 12.7 % (ref 11.5–15.5)
WBC: 7 10*3/uL (ref 4.0–10.5)
nRBC: 0 % (ref 0.0–0.2)

## 2021-04-09 LAB — LIPASE, BLOOD: Lipase: 28 U/L (ref 11–51)

## 2021-04-09 NOTE — ED Triage Notes (Signed)
Pt comes via EMS with c/o abdominal pain and vomiting since this a. Pt was given 75 fent by EMS . 20 g in place, 500 cc given LR and VSS ?

## 2021-04-09 NOTE — ED Notes (Addendum)
Pt reports feeling much better now and is leaving; encouraged to stay & be evaluated further but declines; instr to return for any new or worsening symptoms; 20GA SL to rt a/c removed at request--cannula intact and dsng applied; pt ambulatory without difficulty or distress noted ?

## 2021-04-09 NOTE — ED Triage Notes (Signed)
Epigastric pain since last night. Also endorses N/V/D. Denies fever. Pt unable to describe nature or characteristics of pain.  ?

## 2021-04-09 NOTE — ED Triage Notes (Signed)
Epigastric pain since last night. Also endorses N/V/D. Denies fever. Pt unable to describe nature or characteristics of pain.  ?

## 2021-06-19 ENCOUNTER — Ambulatory Visit (LOCAL_COMMUNITY_HEALTH_CENTER): Payer: Self-pay | Admitting: Nurse Practitioner

## 2021-06-19 ENCOUNTER — Encounter: Payer: Self-pay | Admitting: Nurse Practitioner

## 2021-06-19 VITALS — BP 126/77 | Ht 62.0 in | Wt 181.2 lb

## 2021-06-19 DIAGNOSIS — F32A Depression, unspecified: Secondary | ICD-10-CM

## 2021-06-19 DIAGNOSIS — Z0000 Encounter for general adult medical examination without abnormal findings: Secondary | ICD-10-CM

## 2021-06-19 DIAGNOSIS — Z3009 Encounter for other general counseling and advice on contraception: Secondary | ICD-10-CM

## 2021-06-19 MED ORDER — NORGESTIMATE-ETH ESTRADIOL 0.25-35 MG-MCG PO TABS: 1.0000 | ORAL_TABLET | Freq: Every day | ORAL | 12 refills | Status: DC
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
## 2021-06-19 NOTE — Progress Notes (Signed)
Pt here for annual physical and BCM.  Pt declines STI screening.  Orthocyclen 12 packs given as per provider order. Pt verbalizes understanding of how/when to take pills and use of backup method x 14 days.  Condoms declined.-Collins Scotland, RN

## 2021-06-23 ENCOUNTER — Other Ambulatory Visit: Payer: Self-pay

## 2021-06-23 ENCOUNTER — Emergency Department
Admission: EM | Admit: 2021-06-23 | Discharge: 2021-06-23 | Disposition: A | Discharge status: Home / Self Care | Payer: Medicaid Other | Attending: Emergency Medicine | Admitting: Emergency Medicine

## 2021-06-23 ENCOUNTER — Encounter: Payer: Self-pay | Admitting: Emergency Medicine

## 2021-06-23 DIAGNOSIS — J020 Streptococcal pharyngitis: Secondary | ICD-10-CM | POA: Insufficient documentation

## 2021-06-23 DIAGNOSIS — J029 Acute pharyngitis, unspecified: Secondary | ICD-10-CM | POA: Diagnosis present

## 2021-06-23 LAB — GROUP A STREP BY PCR: Group A Strep by PCR: DETECTED — AB

## 2021-06-23 MED ORDER — PREDNISONE 10 MG (21) PO TBPK: ORAL_TABLET | ORAL | 0 refills | Status: DC

## 2021-06-23 MED ORDER — AMOXICILLIN 400 MG/5ML PO SUSR: 875.0000 mg | Freq: Two times a day (BID) | ORAL | 0 refills | Status: DC

## 2021-06-23 NOTE — ED Provider Notes (Signed)
[facility] Provider Note    Event Date/Time   First MD Initiated Contact with Patient 06/23/21 [phone]     (approximate)   History   Sore Throat   HPI  Dela Eleny Cortez is a 26 y.o. female with no significant past medical history presents with sore throat for 2 days.  Patient states had fever.  Think she has strep throat as she gets this about 5 times a year.  No chest pain or shortness of breath.  Mild cough.      Physical Exam   Triage Vital Signs: ED Triage Vitals  Enc Vitals Group     BP 06/23/21 0639 (!) 140/92     Pulse Rate 06/23/21 0639 (!) 111     Resp 06/23/21 0639 (!) 24     Temp 06/23/21 0639 99.3 F (37.4 C)     Temp Source 06/23/21 0639 Oral     SpO2 06/23/21 0639 98 %     Weight 06/23/21 0639 180 lb (81.6 kg)     Height 06/23/21 0639 5\' 2"  (1.575 m)     Head Circumference --      Peak Flow --      Pain Score 06/23/21 0641 10     Pain Loc --      Pain Edu? --      Excl. in [place]? --     Most recent vital signs: Vitals:   06/23/21 0639  BP: (!) 140/92  Pulse: (!) 111  Resp: (!) 24  Temp: 99.3 F (37.4 C)  SpO2: 98%     General: Awake, no distress.   CV:  Good peripheral perfusion. regular rate and  rhythm Resp:  Normal effort. Lungs CTA Abd:  No distention.   Other:  Throat is red, tonsils swollen with exudate   ED Results / Procedures / Treatments   Labs (all labs ordered are listed, but only abnormal results are displayed) Labs Reviewed  GROUP A STREP BY PCR - Abnormal; Notable for the following components:      Result Value   Group A Strep by PCR DETECTED (*)    All other components within normal limits     EKG     RADIOLOGY     PROCEDURES:   Procedures   MEDICATIONS ORDERED IN ED: Medications - No data to display   IMPRESSION / MDM / ASSESSMENT AND PLAN / ED COURSE  I reviewed the triage vital signs and the nursing notes.                              Differential diagnosis  includes, but is not limited to, strep throat, viral pharyngitis, COVID, influenza  Patient's presentation is most consistent with Patient's presentation is most consistent with acute complicated illness / injury requiring diagnostic workup.   Patient strep test is positive.  We will start her on amoxicillin, Sterapred as she does have very swollen tonsils.  She is to follow-up with [facility].  Return emergency department worsening.  Patient was discharged stable condition.      FINAL CLINICAL IMPRESSION(S) / ED DIAGNOSES   Final diagnoses:  Acute streptococcal pharyngitis     Rx / DC Orders   ED Discharge Orders          Ordered    amoxicillin (AMOXIL) 400 MG/5ML suspension  2 times daily        06/23/21 0734    predniSONE (  STERAPRED UNI-PAK 21 TAB) 10 MG (21) TBPK tablet        06/23/21 5366             Note:  This document was prepared using Dragon voice recognition software and may include unintentional dictation errors.    Faythe Ghee, PA-C 06/23/21 4403    Dionne Bucy, MD 06/23/21 [phone]

## 2021-06-23 NOTE — ED Triage Notes (Signed)
Pt to ED via POV, NAD noted in triage. Pt states significant hx of strep throat, states this feels the same.

## 2021-06-23 NOTE — Discharge Instructions (Signed)
Follow-up with [facility] if you like to have your tonsils removed.  Since you have had more than 5 episodes of strep throat this year you may need to have her tonsils removed.  Take the antibiotic and steroid pack as prescribed.  Return emergency department worsening.

## 2022-01-06 NOTE — L&D Delivery Note (Signed)
Delivery Summary for Maria Richmond  Labor Events:   Preterm labor: No data found  Rupture date: No data found  Rupture time: No data found  Rupture type: Intact  Fluid Color: No data found  Induction: No data found  Augmentation: No data found  Complications: No data found  Cervical ripening: No data found No data found   No data found     Delivery:   Episiotomy: No data found  Lacerations: No data found  Repair suture: No data found  Repair # of packets: No data found  Blood loss (ml): No data found   Information for the patient's newborn:  [name], [name] [[id number]]   Delivery 10/27/2022 8:45 AM by  C-Section, Vacuum Assisted Sex:  female Gestational Age: [redacted]w[redacted]d Delivery Clinician:   Living?:         APGARS  One minute Five minutes Ten minutes  Skin color:        Heart rate:        Grimace:        Muscle tone:        Breathing:        Totals: 9  9      Presentation/position:      Resuscitation:   Cord information:    Disposition of cord blood:     Blood gases sent?  Complications:   Placenta: Delivered:       appearance Newborn Measurements: Weight: 6 lb 7.4 oz (2930 g)  Height: 18.5"  Head circumference:    Chest circumference:    Other providers:    Additional  information: Forceps:   Vacuum:   Breech:   Observed anomalies       See Dr. Oretha Milch operative note for details of C-section delivery.    Hildred Laser, MD [facility]

## 2022-03-14 ENCOUNTER — Ambulatory Visit (INDEPENDENT_AMBULATORY_CARE_PROVIDER_SITE_OTHER): Payer: 59

## 2022-03-14 VITALS — BP 120/80 | HR 93 | Wt 172.1 lb

## 2022-03-14 DIAGNOSIS — Z3201 Encounter for pregnancy test, result positive: Secondary | ICD-10-CM

## 2022-03-14 DIAGNOSIS — N912 Amenorrhea, unspecified: Secondary | ICD-10-CM

## 2022-03-14 DIAGNOSIS — Z3480 Encounter for supervision of other normal pregnancy, unspecified trimester: Secondary | ICD-10-CM

## 2022-03-14 LAB — POCT URINE PREGNANCY: Preg Test, Ur: POSITIVE — AB

## 2022-03-14 NOTE — Progress Notes (Signed)
NURSE VISIT NOTE  Subjective:    Patient ID: Maria Richmond, female    DOB: 01-Sep-1995, 27 y.o.   MRN: [id number]  HPI  Patient is a 27 y.o. [id number] female who presents for evaluation of amenorrhea. She believes she could be pregnant. Patient is ambivalent about pregnancy. Sexual Activity: single partner, contraception: none. Current symptoms also include: frequent urination and positive home pregnancy test. Last period was normal, patient unsure of LMP but believes it was 01/06/22.    Objective:    BP 120/80   Pulse 93   Wt 172 lb 1.6 oz (78.1 kg)   LMP 01/06/2022   BMI 31.48 kg/m   Lab Review  Results for orders placed or performed in visit on 03/14/22  POCT urine pregnancy  Result Value Ref Range   Preg Test, Ur Positive (A) Negative    Assessment:   1. Absence of menstruation   2. Supervision of other normal pregnancy, antepartum     Plan:   Pregnancy Test: Positive  Estimated Date of Delivery: 10/13/2022 if LMP is accurate and correct pending U/S.  Encouraged well-balanced diet, plenty of rest when needed, pre-natal vitamins daily and walking for exercise.  Discussed self-help for nausea, avoiding OTC medications until consulting provider or pharmacist, other than Tylenol as needed, minimal caffeine (1-2 cups daily) and avoiding alcohol.   She will schedule her nurse visit @ [redacted] wks pregnant, u/s for dating and labs @10  wk, and NOB visit at [redacted] wk pregnant.        Fonda Kinder, CMA

## 2022-03-14 NOTE — Patient Instructions (Addendum)
CONGRATULATIONS!  Commonly Asked Questions During Pregnancy  Cats: A parasite can be excreted in cat feces.  To avoid exposure you need to have another person empty the little box.  If you must empty the litter box you will need to wear gloves.  Wash your hands after handling your cat.  This parasite can also be found in raw or undercooked meat so this should also be avoided.  Colds, Sore Throats, Flu: Please check your medication sheet to see what you can take for symptoms.  If your symptoms are unrelieved by these medications please call the office.  Dental Work: Most any dental work Investment [employer], corporate recommends is permitted.  X-rays should only be taken during the first trimester if absolutely necessary.  Your abdomen should be shielded with a lead apron during all x-rays.  Please notify your provider prior to receiving any x-rays.  Novocaine is fine; gas is not recommended.  If your dentist requires a note from [place] prior to dental work please call the office and we will provide one for you.  Exercise: Exercise is an important part of staying healthy during your pregnancy.  You may continue most exercises you were accustomed to prior to pregnancy.  Later in your pregnancy you will most likely notice you have difficulty with activities requiring balance like riding a bicycle.  It is important that you listen to your body and avoid activities that put you at a higher risk of falling.  Adequate rest and staying well hydrated are a must!  If you have questions about the safety of specific activities ask your provider.    Exposure to Children with illness: Try to avoid obvious exposure; report any symptoms to [place] when noted,  If you have chicken pos, red measles or mumps, you should be immune to these diseases.   Please do not take any vaccines while pregnant unless you have checked with your OB provider.  Fetal Movement: After 28 weeks we recommend you do "kick counts" twice daily.  Lie or sit down in a calm  quiet environment and count your baby movements "kicks".  You should feel your baby at least 10 times per hour.  If you have not felt 10 kicks within the first hour get up, walk around and have something sweet to eat or drink then repeat for an additional hour.  If count remains less than 10 per hour notify your provider.  Fumigating: Follow your pest control agent's advice as to how long to stay out of your home.  Ventilate the area well before re-entering.  Hemorrhoids:   Most over-the-counter preparations can be used during pregnancy.  Check your medication to see what is safe to use.  It is important to use a stool softener or fiber in your diet and to drink lots of liquids.  If hemorrhoids seem to be getting worse please call the office.   Hot Tubs:  Hot tubs Jacuzzis and saunas are not recommended while pregnant.  These increase your internal body temperature and should be avoided.  Intercourse:  Sexual intercourse is safe during pregnancy as long as you are comfortable, unless otherwise advised by your provider.  Spotting may occur after intercourse; report any bright red bleeding that is heavier than spotting.  Labor:  If you know that you are in labor, please go to the hospital.  If you are unsure, please call the office and let us help you decide what to do.  Lifting, straining, etc:  If your job  requires heavy lifting or straining please check with your provider for any limitations.  Generally, you should not lift items heavier than that you can lift simply with your hands and arms (no back muscles)  Painting:  Paint fumes do not harm your pregnancy, but may make you ill and should be avoided if possible.  Latex or water based paints have less odor than oils.  Use adequate ventilation while painting.  Permanents & Hair Color:  Chemicals in hair dyes are not recommended as they cause increase hair dryness which can increase hair loss during pregnancy.  " Highlighting" and permanents are  allowed.  Dye may be absorbed differently and permanents may not hold as well during pregnancy.  Sunbathing:  Use a sunscreen, as skin burns easily during pregnancy.  Drink plenty of fluids; avoid over heating.  Tanning Beds:  Because their possible side effects are still unknown, tanning beds are not recommended.  Ultrasound Scans:  Routine ultrasounds are performed at approximately 20 weeks.  You will be able to see your baby's general anatomy an if you would like to know the gender this can usually be determined as well.  If it is questionable when you conceived you may also receive an ultrasound early in your pregnancy for dating purposes.  Otherwise ultrasound exams are not routinely performed unless there is a medical necessity.  Although you can request a scan we ask that you pay for it when conducted because insurance does not cover " patient request" scans.  Work: If your pregnancy proceeds without complications you may work until your due date, unless your physician or employer advises otherwise.  Round Ligament Pain/Pelvic Discomfort:  Sharp, shooting pains not associated with bleeding are fairly common, usually occurring in the second trimester of pregnancy.  They tend to be worse when standing up or when you remain standing for long periods of time.  These are the result of pressure of certain pelvic ligaments called "round ligaments".  Rest, Tylenol and heat seem to be the most effective relief.  As the womb and fetus grow, they rise out of the pelvis and the discomfort improves.  Please notify the office if your pain seems different than that described.  It may represent a more serious condition.   First Trimester of Pregnancy  The first trimester of pregnancy starts on the first day of your last menstrual period until the end of week 12. This is also called months 1 through 3 of pregnancy. Body changes during your first trimester Your body goes through many changes during pregnancy.  The changes usually return to normal after your baby is born. Physical changes You may gain or lose weight. Your breasts may grow larger and hurt. The area around your nipples may get darker. Dark spots or blotches may develop on your face. You may have changes in your hair. Health changes You may feel like you might vomit (nauseous), and you may vomit. You may have heartburn. You may have headaches. You may have trouble pooping (constipation). Your gums may bleed. Other changes You may get tired easily. You may pee (urinate) more often. Your menstrual periods will stop. You may not feel hungry. You may want to eat certain kinds of food. You may have changes in your emotions from day to day. You may have more dreams. Follow these instructions at home: Medicines Take over-the-counter and prescription medicines only as told by your doctor. Some medicines are not safe during pregnancy. Take a prenatal vitamin that contains  at least 600 micrograms (mcg) of folic acid. Eating and drinking Eat healthy meals that include: Fresh fruits and vegetables. Whole grains. Good sources of protein, such as meat, eggs, or tofu. Low-fat dairy products. Avoid raw meat and unpasteurized juice, milk, and cheese. If you feel like you may vomit, or you vomit: Eat 4 or 5 small meals a day instead of 3 large meals. Try eating a few soda crackers. Drink liquids between meals instead of during meals. You may need to take these actions to prevent or treat trouble pooping: Drink enough fluids to keep your pee (urine) pale yellow. Eat foods that are high in fiber. These include beans, whole grains, and fresh fruits and vegetables. Limit foods that are high in fat and sugar. These include fried or sweet foods. Activity Exercise only as told by your doctor. Most people can do their usual exercise routine during pregnancy. Stop exercising if you have cramps or pain in your lower belly (abdomen) or low  back. Do not exercise if it is too hot or too humid, or if you are in a place of great height (high altitude). Avoid heavy lifting. If you choose to, you may have sex unless your doctor tells you not to. Relieving pain and discomfort Wear a good support bra if your breasts are sore. Rest with your legs raised (elevated) if you have leg cramps or low back pain. If you have bulging veins (varicose veins) in your legs: Wear support hose as told by your doctor. Raise your feet for 15 minutes, 3-4 times a day. Limit salt in your food. Safety Wear your seat belt at all times when you are in a car. Talk with your doctor if someone is hurting you or yelling at you. Talk with your doctor if you are feeling sad or have thoughts of hurting yourself. Lifestyle Do not use hot tubs, steam rooms, or saunas. Do not douche. Do not use tampons or scented sanitary pads. Do not use herbal medicines, illegal drugs, or medicines that are not approved by your doctor. Do not drink alcohol. Do not smoke or use any products that contain nicotine or tobacco. If you need help quitting, ask your doctor. Avoid cat litter boxes and soil that is used by cats. These carry germs that can cause harm to the baby and can cause a loss of your baby by miscarriage or stillbirth. General instructions Keep all follow-up visits. This is important. Ask for help if you need counseling or if you need help with nutrition. Your doctor can give you advice or tell you where to go for help. Visit your dentist. At home, brush your teeth with a soft toothbrush. Floss gently. Write down your questions. Take them to your prenatal visits. Where to find more information American Pregnancy Association: americanpregnancy.org [employer]: www.acog.org Office on Women's Health: [employer]Port[employer].br Contact a doctor if: You are dizzy. You have a fever. You have mild cramps or pressure in your lower  belly. You have a nagging pain in your belly area. You continue to feel like you may vomit, you vomit, or you have watery poop (diarrhea) for 24 hours or longer. You have a bad-smelling fluid coming from your vagina. You have pain when you pee. You are exposed to a disease that spreads from person to person, such as chickenpox, measles, Zika virus, HIV, or hepatitis. Get help right away if: You have spotting or bleeding from your vagina. You have very bad belly cramping or  pain. You have shortness of breath or chest pain. You have any kind of injury, such as from a fall or a car crash. You have new or increased pain, swelling, or redness in an arm or leg. Summary The first trimester of pregnancy starts on the first day of your last menstrual period until the end of week 12 (months 1 through 3). Eat 4 or 5 small meals a day instead of 3 large meals. Do not smoke or use any products that contain nicotine or tobacco. If you need help quitting, ask your doctor. Keep all follow-up visits. This information is not intended to replace advice given to you by your health care provider. Make sure you discuss any questions you have with your health care provider. Document Revised: 06/01/2019 Document Reviewed: 04/07/2019 Elsevier Patient Education  [place].

## 2022-03-24 ENCOUNTER — Ambulatory Visit
Admission: RE | Admit: 2022-03-24 | Discharge: 2022-03-24 | Disposition: A | Discharge status: Home / Self Care | Payer: Self-pay | Source: Ambulatory Visit | Attending: Certified Nurse Midwife | Admitting: Certified Nurse Midwife

## 2022-03-24 DIAGNOSIS — Z3687 Encounter for antenatal screening for uncertain dates: Secondary | ICD-10-CM | POA: Insufficient documentation

## 2022-03-24 DIAGNOSIS — Z3480 Encounter for supervision of other normal pregnancy, unspecified trimester: Secondary | ICD-10-CM | POA: Insufficient documentation

## 2022-03-24 DIAGNOSIS — Z3A01 Less than 8 weeks gestation of pregnancy: Secondary | ICD-10-CM | POA: Insufficient documentation

## 2022-04-11 ENCOUNTER — Telehealth: Payer: Self-pay | Admitting: Obstetrics

## 2022-04-11 ENCOUNTER — Ambulatory Visit (INDEPENDENT_AMBULATORY_CARE_PROVIDER_SITE_OTHER): Payer: Self-pay

## 2022-04-11 VITALS — Wt 172.0 lb

## 2022-04-11 DIAGNOSIS — Z130 Encounter for screening for diseases of the blood and blood-forming organs and certain disorders involving the immune mechanism: Secondary | ICD-10-CM

## 2022-04-11 DIAGNOSIS — Z3689 Encounter for other specified antenatal screening: Secondary | ICD-10-CM

## 2022-04-11 DIAGNOSIS — Z131 Encounter for screening for diabetes mellitus: Secondary | ICD-10-CM

## 2022-04-11 DIAGNOSIS — Z369 Encounter for antenatal screening, unspecified: Secondary | ICD-10-CM

## 2022-04-11 DIAGNOSIS — Z3480 Encounter for supervision of other normal pregnancy, unspecified trimester: Secondary | ICD-10-CM

## 2022-04-11 NOTE — Patient Instructions (Signed)
First Trimester of Pregnancy  The first trimester of pregnancy starts on the first day of your last menstrual period until the end of week 12. This is also called months 1 through 3 of pregnancy. Body changes during your first trimester Your body goes through many changes during pregnancy. The changes usually return to normal after your baby is born. Physical changes You may gain or lose weight. Your breasts may grow larger and hurt. The area around your nipples may get darker. Dark spots or blotches may develop on your face. You may have changes in your hair. Health changes You may feel like you might vomit (nauseous), and you may vomit. You may have heartburn. You may have headaches. You may have trouble pooping (constipation). Your gums may bleed. Other changes You may get tired easily. You may pee (urinate) more often. Your menstrual periods will stop. You may not feel hungry. You may want to eat certain kinds of food. You may have changes in your emotions from day to day. You may have more dreams. Follow these instructions at home: Medicines Take over-the-counter and prescription medicines only as told by your doctor. Some medicines are not safe during pregnancy. Take a prenatal vitamin that contains at least 600 micrograms (mcg) of folic acid. Eating and drinking Eat healthy meals that include: Fresh fruits and vegetables. Whole grains. Good sources of protein, such as meat, eggs, or tofu. Low-fat dairy products. Avoid raw meat and unpasteurized juice, milk, and cheese. If you feel like you may vomit, or you vomit: Eat 4 or 5 small meals a day instead of 3 large meals. Try eating a few soda crackers. Drink liquids between meals instead of during meals. You may need to take these actions to prevent or treat trouble pooping: Drink enough fluids to keep your pee (urine) pale yellow. Eat foods that are high in fiber. These include beans, whole grains, and fresh fruits and  vegetables. Limit foods that are high in fat and sugar. These include fried or sweet foods. Activity Exercise only as told by your doctor. Most people can do their usual exercise routine during pregnancy. Stop exercising if you have cramps or pain in your lower belly (abdomen) or low back. Do not exercise if it is too hot or too humid, or if you are in a place of great height (high altitude). Avoid heavy lifting. If you choose to, you may have sex unless your doctor tells you not to. Relieving pain and discomfort Wear a good support bra if your breasts are sore. Rest with your legs raised (elevated) if you have leg cramps or low back pain. If you have bulging veins (varicose veins) in your legs: Wear support hose as told by your doctor. Raise your feet for 15 minutes, 3-4 times a day. Limit salt in your food. Safety Wear your seat belt at all times when you are in a car. Talk with your doctor if someone is hurting you or yelling at you. Talk with your doctor if you are feeling sad or have thoughts of hurting yourself. Lifestyle Do not use hot tubs, steam rooms, or saunas. Do not douche. Do not use tampons or scented sanitary pads. Do not use herbal medicines, illegal drugs, or medicines that are not approved by your doctor. Do not drink alcohol. Do not smoke or use any products that contain nicotine or tobacco. If you need help quitting, ask your doctor. Avoid cat litter boxes and soil that is used by cats. These carry   germs that can cause harm to the baby and can cause a loss of your baby by miscarriage or stillbirth. General instructions Keep all follow-up visits. This is important. Ask for help if you need counseling or if you need help with nutrition. Your doctor can give you advice or tell you where to go for help. Visit your dentist. At home, brush your teeth with a soft toothbrush. Floss gently. Write down your questions. Take them to your prenatal visits. Where to find more  information American Pregnancy Association: americanpregnancy.org American College of Obstetricians and Gynecologists: www.acog.org Office on Women's Health: womenshealth.gov/pregnancy Contact a doctor if: You are dizzy. You have a fever. You have mild cramps or pressure in your lower belly. You have a nagging pain in your belly area. You continue to feel like you may vomit, you vomit, or you have watery poop (diarrhea) for 24 hours or longer. You have a bad-smelling fluid coming from your vagina. You have pain when you pee. You are exposed to a disease that spreads from person to person, such as chickenpox, measles, Zika virus, HIV, or hepatitis. Get help right away if: You have spotting or bleeding from your vagina. You have very bad belly cramping or pain. You have shortness of breath or chest pain. You have any kind of injury, such as from a fall or a car crash. You have new or increased pain, swelling, or redness in an arm or leg. Summary The first trimester of pregnancy starts on the first day of your last menstrual period until the end of week 12 (months 1 through 3). Eat 4 or 5 small meals a day instead of 3 large meals. Do not smoke or use any products that contain nicotine or tobacco. If you need help quitting, ask your doctor. Keep all follow-up visits. This information is not intended to replace advice given to you by your health care provider. Make sure you discuss any questions you have with your health care provider. Document Revised: 06/01/2019 Document Reviewed: 04/07/2019 Elsevier Patient Education  2023 [employer] Commonly Asked Questions During Pregnancy  Cats: A parasite can be excreted in cat feces.  To avoid exposure you need to have another person empty the little box.  If you must empty the litter box you will need to wear gloves.  Wash your hands after handling your cat.  This parasite can also be found in raw or undercooked meat so this should also be  avoided.  Colds, Sore Throats, Flu: Please check your medication sheet to see what you can take for symptoms.  If your symptoms are unrelieved by these medications please call the office.  Dental Work: Most any dental work your dentist recommends is permitted.  X-rays should only be taken during the first trimester if absolutely necessary.  Your abdomen should be shielded with a lead apron during all x-rays.  Please notify your provider prior to receiving any x-rays.  Novocaine is fine; gas is not recommended.  If your dentist requires a note from us prior to dental work please call the office and we will provide one for you.  Exercise: Exercise is an important part of staying healthy during your pregnancy.  You may continue most exercises you were accustomed to prior to pregnancy.  Later in your pregnancy you will most likely notice you have difficulty with activities requiring balance like riding a bicycle.  It is important that you listen to your body and avoid activities that put you at a higher   risk of falling.  Adequate rest and staying well hydrated are a must!  If you have questions about the safety of specific activities ask your provider.    Exposure to Children with illness: Try to avoid obvious exposure; report any symptoms to us when noted,  If you have chicken pos, red measles or mumps, you should be immune to these diseases.   Please do not take any vaccines while pregnant unless you have checked with your OB provider.  Fetal Movement: After 28 weeks we recommend you do "kick counts" twice daily.  Lie or sit down in a calm quiet environment and count your baby movements "kicks".  You should feel your baby at least 10 times per hour.  If you have not felt 10 kicks within the first hour get up, walk around and have something sweet to eat or drink then repeat for an additional hour.  If count remains less than 10 per hour notify your provider.  Fumigating: Follow your pest control agent's  advice as to how long to stay out of your home.  Ventilate the area well before re-entering.  Hemorrhoids:   Most over-the-counter preparations can be used during pregnancy.  Check your medication to see what is safe to use.  It is important to use a stool softener or fiber in your diet and to drink lots of liquids.  If hemorrhoids seem to be getting worse please call the office.   Hot Tubs:  Hot tubs Jacuzzis and saunas are not recommended while pregnant.  These increase your internal body temperature and should be avoided.  Intercourse:  Sexual intercourse is safe during pregnancy as long as you are comfortable, unless otherwise advised by your provider.  Spotting may occur after intercourse; report any bright red bleeding that is heavier than spotting.  Labor:  If you know that you are in labor, please go to the hospital.  If you are unsure, please call the office and let us help you decide what to do.  Lifting, straining, etc:  If your job requires heavy lifting or straining please check with your provider for any limitations.  Generally, you should not lift items heavier than that you can lift simply with your hands and arms (no back muscles)  Painting:  Paint fumes do not harm your pregnancy, but may make you ill and should be avoided if possible.  Latex or water based paints have less odor than oils.  Use adequate ventilation while painting.  Permanents & Hair Color:  Chemicals in hair dyes are not recommended as they cause increase hair dryness which can increase hair loss during pregnancy.  " Highlighting" and permanents are allowed.  Dye may be absorbed differently and permanents may not hold as well during pregnancy.  Sunbathing:  Use a sunscreen, as skin burns easily during pregnancy.  Drink plenty of fluids; avoid over heating.  Tanning Beds:  Because their possible side effects are still unknown, tanning beds are not recommended.  Ultrasound Scans:  Routine ultrasounds are performed  at approximately 20 weeks.  You will be able to see your baby's general anatomy an if you would like to know the gender this can usually be determined as well.  If it is questionable when you conceived you may also receive an ultrasound early in your pregnancy for dating purposes.  Otherwise ultrasound exams are not routinely performed unless there is a medical necessity.  Although you can request a scan we ask that you pay for it when   conducted because insurance does not cover " patient request" scans.  Work: If your pregnancy proceeds without complications you may work until your due date, unless your physician or employer advises otherwise.  Round Ligament Pain/Pelvic Discomfort:  Sharp, shooting pains not associated with bleeding are fairly common, usually occurring in the second trimester of pregnancy.  They tend to be worse when standing up or when you remain standing for long periods of time.  These are the result of pressure of certain pelvic ligaments called "round ligaments".  Rest, Tylenol and heat seem to be the most effective relief.  As the womb and fetus grow, they rise out of the pelvis and the discomfort improves.  Please notify the office if your pain seems different than that described.  It may represent a more serious condition.  Common Medications Safe in Pregnancy  Acne:      Constipation:  Benzoyl Peroxide     Colace  Clindamycin      Dulcolax Suppository  Topica Erythromycin     Fibercon  Salicylic Acid      Metamucil         Miralax AVOID:        Senakot   Accutane    Cough:  Retin-A       Cough Drops  Tetracycline      Phenergan w/ Codeine if Rx  Minocycline      Robitussin (Plain & DM)  Antibiotics:     Crabs/Lice:  Ceclor       RID  Cephalosporins    AVOID:  E-Mycins      Kwell  Keflex  Macrobid/Macrodantin   Diarrhea:  Penicillin      Kao-Pectate  Zithromax      Imodium AD         PUSH FLUIDS AVOID:       Cipro     Fever:  Tetracycline      Tylenol (Regular  or Extra  Minocycline       Strength)  Levaquin      Extra Strength-Do not          Exceed 8 tabs/24 hrs Caffeine:        <200mg/day (equiv. To 1 cup of coffee or  approx. 3 12 oz sodas)         Gas: Cold/Hayfever:       Gas-X  Benadryl      Mylicon  Claritin       Phazyme  **Claritin-D        Chlor-Trimeton    Headaches:  Dimetapp      ASA-Free Excedrin  Drixoral-Non-Drowsy     Cold Compress  Mucinex (Guaifenasin)     Tylenol (Regular or Extra  Sudafed/Sudafed-12 Hour     Strength)  **Sudafed PE Pseudoephedrine   Tylenol Cold & Sinus     Vicks Vapor Rub  Zyrtec  **AVOID if Problems With Blood Pressure         Heartburn: Avoid lying down for at least 1 hour after meals  Aciphex      Maalox     Rash:  Milk of Magnesia     Benadryl    Mylanta       1% Hydrocortisone Cream  Pepcid  Pepcid Complete   Sleep Aids:  Prevacid      Ambien   Prilosec       Benadryl  Rolaids       Chamomile Tea  Tums (Limit 4/day)     Unisom           Tylenol PM         Warm milk-add vanilla or  Hemorrhoids:       Sugar for taste  Anusol/Anusol H.C.  (RX: Analapram 2.5%)  Sugar Substitutes:  Hydrocortisone OTC     Ok in moderation  Preparation H      Tucks        Vaseline lotion applied to tissue with wiping    Herpes:     Throat:  Acyclovir      Oragel  Famvir  Valtrex     Vaccines:         Flu Shot Leg Cramps:       *Gardasil  Benadryl      Hepatitis A         Hepatitis B Nasal Spray:       Pneumovax  Saline Nasal Spray     Polio Booster         Tetanus Nausea:       Tuberculosis test or PPD  Vitamin B6 25 mg TID   AVOID:    Dramamine      *Gardasil  Emetrol       Live Poliovirus  Ginger Root 250 mg QID    MMR (measles, mumps &  High Complex Carbs @ Bedtime    rebella)  Sea Bands-Accupressure    Varicella (Chickenpox)  Unisom 1/2 tab TID     *No known complications           If received before Pain:         Known pregnancy;   Darvocet       Resume series  after  Lortab        Delivery  Percocet    Yeast:   Tramadol      Femstat  Tylenol 3      Gyne-lotrimin  Ultram       Monistat  Vicodin           MISC:         All Sunscreens           Hair Coloring/highlights          Insect Repellant's          (Including DEET)         Mystic Tans  
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
## 2022-04-11 NOTE — Progress Notes (Signed)
There is a phone encounter. She is currently scheduled with Claris Che. I am awaiting confirmation.

## 2022-04-11 NOTE — Telephone Encounter (Signed)
I contacted the patient via phone. " Call couldn't be completed as dialed". No voicemail, no answer. I have the patient scheduled for 4/25 at 2:55 pm with [facility] for New OB appointment.

## 2022-04-25 NOTE — Patient Instructions (Signed)

## 2022-04-25 NOTE — Progress Notes (Unsigned)
OBSTETRIC INITIAL PRENATAL VISIT  Subjective:    Maria Richmond is being seen today for her first obstetrical visit.  This {is/is not:9024} a planned pregnancy. She is a 27 y.o. [id number] female at [redacted]w[redacted]d gestation, Estimated Date of Delivery: 11/17/22 with Patient's last menstrual period was 01/06/2022 (approximate).,  consistent with [redacted]w[redacted]d sono. Her obstetrical history is significant for Preterm delivery and Postpartum course complicated by neonatal demise . Relationship with FOB: {fob:16621}. Patient {does/does not:19097} intend to breast feed. Pregnancy history fully reviewed.    OB History  Gravida Para Term Preterm AB Living  SAB IAB Ectopic Multiple Live Births  1 0 0 0 4    # Outcome Date GA Lbr Len/2nd Weight Sex Delivery Anes PTL Lv  6 Current           5 Term 10/04/20 [redacted]w[redacted]d  5 lb 7.1 oz (2.47 kg) M CS-LVertical Spinal  LIV     Name: Dejarnett,BOY Meia     Apgar1: 9  Apgar5: 9  4 Term 12/18/17 [redacted]w[redacted]d  6 lb 0.3 oz (2.73 kg) F CS-Vac Spinal  LIV     Name: Fredin,GIRL Enas     Apgar1: 9  Apgar5: 9  3 Term 07/17/16 [redacted]w[redacted]d  6 lb 6 oz (2.892 kg) M CS-LTranv Spinal  LIV     Name: Adsit,PENDINGBABY     Apgar1: 9  Apgar5: 9  2 SAB 2017          1 Preterm 07/20/14 [redacted]w[redacted]d  1 lb 14 oz (0.85 kg) F CS-Classical   ND     Complications: Abruptio Placenta, Preterm premature rupture of membranes (PPROM) delivered, current hospitalization    Obstetric Comments  The first preg died after birth.    Gynecologic History:  Last pap smear was 04/25/20.  Results were Normal.  denies h/o abnormal pap smears in the past.  reports history of STIs.  Contraception prior to conception:    Past Medical History:  Diagnosis Date   Depression    GERD (gastroesophageal reflux disease)    Medical history non-contributory    Preterm labor     Family History  Problem Relation Age of Onset   Healthy Mother    Healthy Father    Healthy Brother    Healthy Brother    Healthy  Brother    Diabetes Maternal Grandmother    Hypertension Maternal Grandmother    Heart Problems Maternal Grandmother    Stroke Maternal Grandmother    Thyroid disease Maternal Grandmother    Healthy Maternal Grandfather    Healthy Paternal Grandmother    Healthy Paternal Grandfather    Diabetes Paternal Grandfather    Hypertension Paternal Grandfather     Past Surgical History:  Procedure Laterality Date   CESAREAN SECTION     CESAREAN SECTION N/A 07/17/2016   Procedure: CESAREAN SECTION, Female, 6lb, 6oz;  Surgeon: Conard Novak, MD;  Location: [facility];  Service: Obstetrics;  Laterality: N/A;   CESAREAN SECTION N/A 12/18/2017   Procedure: CESAREAN SECTION;  Surgeon: Natale Milch, MD;  Location: [facility];  Service: Obstetrics;  Laterality: N/A;  Time of Birth: 13:00 Sex: Female Wt: 6 lb 0 oz   CESAREAN SECTION N/A 10/04/2020   Procedure: CESAREAN SECTION;  Surgeon: Conard Novak, MD;  Location: [facility];  Service: Obstetrics;  Laterality: N/A;   WISDOM TOOTH EXTRACTION  2022   two;    Social History   Socioeconomic History   Marital  status: Single    Spouse name: Not on file   Number of children: 3   Years of education: 12   Highest education level: Not on file  Occupational History   Occupation: convenience store  Tobacco Use   Smoking status: Every Day    Packs/day: 0.25    Years: 6.00    Additional pack years: 0.00    Total pack years: 1.50    Types: Cigarettes   Smokeless tobacco: Never   Tobacco comments:    04/11/22 - trying to quit  Vaping Use   Vaping Use: Former  Substance and Sexual Activity   Alcohol use: Not Currently    Alcohol/week: 9.0 standard drinks of alcohol    Types: 3 Glasses of wine, 3 Cans of beer, 3 Shots of liquor per week    Comment: states she "drinks alot"   Drug use: Not Currently    Types: Marijuana   Sexual activity: Yes    Partners: Male    Birth control/protection: None  Other Topics Concern   Not on file   Social History Narrative   ** Merged History Encounter **       Social Determinants of Health   Financial Resource Strain: Medium Risk (04/11/2022)   Overall Financial Resource Strain (CARDIA)    Difficulty of Paying Living Expenses: Somewhat hard  Food Insecurity: No Food Insecurity (04/11/2022)   Hunger Vital Sign    Worried About Running Out of Food in the Last Year: Never true    Ran Out of Food in the Last Year: Never true  Transportation Needs: No Transportation Needs (04/11/2022)   PRAPARE - Administrator, Civil Service (Medical): No    Lack of Transportation (Non-Medical): No  Physical Activity: Insufficiently Active (04/11/2022)   Exercise Vital Sign    Days of Exercise per Week: 7 days    Minutes of Exercise per Session: 20 min  Stress: No Stress Concern Present (04/11/2022)   [employer] of Occupational Health - Occupational Stress Questionnaire    Feeling of Stress : Not at all  Social Connections: Moderately Integrated (04/11/2022)   Social Connection and Isolation Panel [NHANES]    Frequency of Communication with Friends and Family: Three times a week    Frequency of Social Gatherings with Friends and Family: Twice a week    Attends Religious Services: More than 4 times per year    Active Member of [employer] or Organizations: No    Attends Banker Meetings: Never    Marital Status: Living with partner  Intimate Partner Violence: Not At Risk (04/11/2022)   Humiliation, Afraid, Rape, and Kick questionnaire    Fear of Current or Ex-Partner: No    Emotionally Abused: No    Physically Abused: No    Sexually Abused: No    No current outpatient medications on file prior to visit.   No current facility-administered medications on file prior to visit.    No Known Allergies   Review of Systems General: Not Present- Fever, Weight Loss and Weight Gain. Skin: Not Present- Rash. HEENT: Not Present- Blurred Vision, Headache and Bleeding  Gums. Respiratory: Not Present- Difficulty Breathing. Breast: Not Present- Breast Mass. Cardiovascular: Not Present- Chest Pain, Elevated Blood Pressure, Fainting / Blacking Out and Shortness of Breath. Gastrointestinal: Not Present- Abdominal Pain, Constipation, Nausea and Vomiting. Female Genitourinary: Not Present- Frequency, Painful Urination, Pelvic Pain, Vaginal Bleeding, Vaginal Discharge, Contractions, regular, Fetal Movements Decreased, Urinary Complaints and Vaginal Fluid. Musculoskeletal: Not Present- Back  Pain and Leg Cramps. Neurological: Not Present- Dizziness. Psychiatric: Not Present- Depression.     Objective:   Last menstrual period 01/06/2022, unknown if currently breastfeeding.  There is no height or weight on file to calculate BMI.  General Appearance:    Alert, cooperative, no distress, appears stated age  Head:    Normocephalic, without obvious abnormality, atraumatic  Eyes:    PERRL, conjunctiva/corneas clear, EOM's intact, both eyes  Ears:    Normal external ear canals, both ears  Nose:   Nares normal, septum midline, mucosa normal, no drainage or sinus tenderness  Throat:   Lips, mucosa, and tongue normal; teeth and gums normal  Neck:   Supple, symmetrical, trachea midline, no adenopathy; thyroid: no enlargement/tenderness/nodules; no carotid bruit or JVD  Back:     Symmetric, no curvature, ROM normal, no CVA tenderness  Lungs:     Clear to auscultation bilaterally, respirations unlabored  Chest Wall:    No tenderness or deformity   Heart:    Regular rate and rhythm, S1 and S2 normal, no murmur, rub or gallop  Breast Exam:    No tenderness, masses, or nipple abnormality  Abdomen:     Soft, non-tender, bowel sounds active all four quadrants, no masses, no organomegaly.  FHT ***  bpm.  Genitalia:    Pelvic:external genitalia normal, vagina without lesions, discharge, or tenderness, rectovaginal septum  normal. Cervix normal in appearance, no cervical motion  tenderness, no adnexal masses or tenderness.  Pregnancy positive findings: uterine enlargement: *** wk size, nontender.   Rectal:    Normal external sphincter.  No hemorrhoids appreciated. Internal exam not done.   Extremities:   Extremities normal, atraumatic, no cyanosis or edema  Pulses:   2+ and symmetric all extremities  Skin:   Skin color, texture, turgor normal, no rashes or lesions  Lymph nodes:   Cervical, supraclavicular, and axillary nodes normal  Neurologic:   CNII-XII intact, normal strength, sensation and reflexes throughout     Assessment:   No diagnosis found.  Plan:   Supervision of ***normal/high risk pregnancy  - Initial labs reviewed. - Prenatal vitamins encouraged. - Problem list reviewed and updated. - New OB counseling:  The patient has been given an overview regarding routine prenatal care.  Recommendations regarding diet, weight gain, and exercise in pregnancy were given. - Prenatal testing, optional genetic testing, and ultrasound use in pregnancy were reviewed.  Traditional genetic screening vs cell-fee DNA genetic screening discussed, including risks and benefits. Testing {requests/ordered/declines:14581}. - Benefits of Breast Feeding were discussed. The patient is encouraged to consider nursing her baby post partum.  There are no diagnoses linked to this encounter.    Follow up in 4 weeks.   Paula Compton CNM [facility]

## 2022-05-01 ENCOUNTER — Encounter: Payer: Self-pay | Admitting: Obstetrics

## 2022-05-01 ENCOUNTER — Ambulatory Visit (INDEPENDENT_AMBULATORY_CARE_PROVIDER_SITE_OTHER): Payer: Self-pay | Admitting: Obstetrics

## 2022-05-01 ENCOUNTER — Other Ambulatory Visit (HOSPITAL_COMMUNITY)
Admission: RE | Admit: 2022-05-01 | Discharge: 2022-05-01 | Disposition: A | Discharge status: Home / Self Care | Payer: 59 | Source: Ambulatory Visit | Attending: Obstetrics | Admitting: Obstetrics

## 2022-05-01 VITALS — BP 121/80 | HR 96 | Wt 169.0 lb

## 2022-05-01 DIAGNOSIS — Z124 Encounter for screening for malignant neoplasm of cervix: Secondary | ICD-10-CM | POA: Diagnosis present

## 2022-05-01 DIAGNOSIS — Z131 Encounter for screening for diabetes mellitus: Secondary | ICD-10-CM

## 2022-05-01 DIAGNOSIS — Z3491 Encounter for supervision of normal pregnancy, unspecified, first trimester: Secondary | ICD-10-CM

## 2022-05-01 DIAGNOSIS — Z3A11 11 weeks gestation of pregnancy: Secondary | ICD-10-CM

## 2022-05-01 DIAGNOSIS — O99321 Drug use complicating pregnancy, first trimester: Secondary | ICD-10-CM

## 2022-05-01 DIAGNOSIS — F1290 Cannabis use, unspecified, uncomplicated: Secondary | ICD-10-CM

## 2022-05-01 DIAGNOSIS — Z3480 Encounter for supervision of other normal pregnancy, unspecified trimester: Secondary | ICD-10-CM | POA: Diagnosis not present

## 2022-05-01 DIAGNOSIS — Z130 Encounter for screening for diseases of the blood and blood-forming organs and certain disorders involving the immune mechanism: Secondary | ICD-10-CM

## 2022-05-01 DIAGNOSIS — Z369 Encounter for antenatal screening, unspecified: Secondary | ICD-10-CM | POA: Diagnosis not present

## 2022-05-01 NOTE — Addendum Note (Signed)
Addended by: Cornelius Moras D on: 05/01/2022 04:31 PM   Modules accepted: Orders

## 2022-05-01 NOTE — Addendum Note (Signed)
Addended by: Cornelius Moras D on: 05/01/2022 04:30 PM   Modules accepted: Orders

## 2022-05-02 LAB — URINALYSIS, ROUTINE W REFLEX MICROSCOPIC
Bilirubin, UA: NEGATIVE
Glucose, UA: NEGATIVE
Ketones, UA: NEGATIVE
Leukocytes,UA: NEGATIVE
Nitrite, UA: NEGATIVE
RBC, UA: NEGATIVE
Specific Gravity, UA: 1.029 (ref 1.005–1.030)
Urobilinogen, Ur: 1 mg/dL (ref 0.2–1.0)
pH, UA: 6.5 (ref 5.0–7.5)

## 2022-05-03 LAB — CULTURE, OB URINE

## 2022-05-03 LAB — URINE CULTURE, OB REFLEX

## 2022-05-05 ENCOUNTER — Other Ambulatory Visit: Payer: Self-pay | Admitting: Obstetrics

## 2022-05-05 ENCOUNTER — Encounter: Payer: Self-pay | Admitting: Obstetrics

## 2022-05-05 DIAGNOSIS — B9689 Other specified bacterial agents as the cause of diseases classified elsewhere: Secondary | ICD-10-CM

## 2022-05-05 DIAGNOSIS — A599 Trichomoniasis, unspecified: Secondary | ICD-10-CM | POA: Insufficient documentation

## 2022-05-05 DIAGNOSIS — B3731 Acute candidiasis of vulva and vagina: Secondary | ICD-10-CM

## 2022-05-05 LAB — CERVICOVAGINAL ANCILLARY ONLY
Bacterial Vaginitis (gardnerella): POSITIVE — AB
Candida Glabrata: NEGATIVE
Candida Vaginitis: POSITIVE — AB
Chlamydia: NEGATIVE
Comment: NEGATIVE
Comment: NEGATIVE
Comment: NEGATIVE
Comment: NEGATIVE
Comment: NEGATIVE
Comment: NORMAL
Neisseria Gonorrhea: NEGATIVE
Trichomonas: POSITIVE — AB

## 2022-05-05 LAB — CYTOLOGY - PAP: Diagnosis: NEGATIVE

## 2022-05-05 LAB — URINE CYTOLOGY ANCILLARY ONLY
Chlamydia: NEGATIVE
Comment: NEGATIVE
Comment: NORMAL
Neisseria Gonorrhea: NEGATIVE

## 2022-05-05 MED ORDER — METRONIDAZOLE 500 MG PO TABS
500.0000 mg | ORAL_TABLET | Freq: Two times a day (BID) | ORAL | 0 refills | Status: AC
Start: 2022-05-05 — End: 2022-05-12

## 2022-05-05 MED ORDER — TERCONAZOLE 0.4 % VA CREA
1.0000 | TOPICAL_CREAM | Freq: Every day | VAGINAL | 0 refills | Status: DC
Start: 2022-05-05 — End: 2022-06-24

## 2022-05-05 NOTE — Progress Notes (Signed)
Patient seen for her NOB physical and tests + for Trich, BV, and yeast. I have sent her a MyChart message as her cell pone number is not in order. I have sent 2 Rxs in for her.Metronidazole 50 mg po BID and Terconazole cream. Mirna Mires, CNM  05/05/2022 5:34 PM

## 2022-05-07 LAB — CBC/D/PLT+RPR+RH+ABO+RUBIGG...
Antibody Screen: NEGATIVE
Basophils Absolute: 0 10*3/uL (ref 0.0–0.2)
Basos: 0 %
EOS (ABSOLUTE): 0.1 10*3/uL (ref 0.0–0.4)
Eos: 2 %
HCV Ab: NONREACTIVE
HIV Screen 4th Generation wRfx: NONREACTIVE
Hematocrit: 37.2 % (ref 34.0–46.6)
Hemoglobin: 12.7 g/dL (ref 11.1–15.9)
Hepatitis B Surface Ag: NEGATIVE
Immature Grans (Abs): 0 10*3/uL (ref 0.0–0.1)
Immature Granulocytes: 0 %
Lymphocytes Absolute: 2.1 10*3/uL (ref 0.7–3.1)
Lymphs: 46 %
MCH: 29.6 pg (ref 26.6–33.0)
MCHC: 34.1 g/dL (ref 31.5–35.7)
MCV: 87 fL (ref 79–97)
Monocytes Absolute: 0.5 10*3/uL (ref 0.1–0.9)
Monocytes: 11 %
Neutrophils Absolute: 1.9 10*3/uL (ref 1.4–7.0)
Neutrophils: 41 %
Platelets: 181 10*3/uL (ref 150–450)
RBC: 4.29 x10E6/uL (ref 3.77–5.28)
RDW: 12.2 % (ref 11.7–15.4)
RPR Ser Ql: NONREACTIVE
Rh Factor: POSITIVE
Rubella Antibodies, IGG: <0.9 index — ABNORMAL LOW (ref >0.99)
Varicella zoster IgG: <135 index — ABNORMAL LOW (ref >165)
WBC: 4.6 10*3/uL (ref 3.4–10.8)

## 2022-05-07 LAB — HEMOGLOBIN A1C
Est. average glucose Bld gHb Est-mCnc: 117 mg/dL
Hgb A1c MFr Bld: 5.7 % — ABNORMAL HIGH (ref 4.8–5.6)

## 2022-05-07 LAB — HGB FRACTIONATION CASCADE
Hgb A2: 2.7 % (ref 1.8–3.2)
Hgb A: 97.3 % (ref 96.4–98.8)
Hgb F: 0 % (ref 0.0–2.0)
Hgb S: 0 %

## 2022-05-07 LAB — HCV INTERPRETATION

## 2022-05-08 LAB — MONITOR DRUG PROFILE 14(MW)
Amphetamine Scrn, Ur: NEGATIVE ng/mL
BARBITURATE SCREEN URINE: NEGATIVE ng/mL
BENZODIAZEPINE SCREEN, URINE: NEGATIVE ng/mL
Buprenorphine, Urine: NEGATIVE ng/mL
Cocaine (Metab) Scrn, Ur: NEGATIVE ng/mL
Creatinine(Crt), U: 262.7 mg/dL (ref 20.0–300.0)
Fentanyl, Urine: NEGATIVE pg/mL
Meperidine Screen, Urine: NEGATIVE ng/mL
Methadone Screen, Urine: NEGATIVE ng/mL
OXYCODONE+OXYMORPHONE UR QL SCN: NEGATIVE ng/mL
Opiate Scrn, Ur: NEGATIVE ng/mL
Ph of Urine: 6.3 (ref 4.5–8.9)
Phencyclidine Qn, Ur: NEGATIVE ng/mL
Propoxyphene Scrn, Ur: NEGATIVE ng/mL
SPECIFIC GRAVITY: 1.026
Tramadol Screen, Urine: NEGATIVE ng/mL

## 2022-05-08 LAB — CANNABINOID (GC/MS), URINE
Cannabinoid: POSITIVE — AB
Carboxy THC (GC/MS): 387 ng/mL

## 2022-05-08 LAB — NICOTINE SCREEN, URINE: Cotinine Ql Scrn, Ur: POSITIVE ng/mL — AB

## 2022-05-29 ENCOUNTER — Ambulatory Visit (INDEPENDENT_AMBULATORY_CARE_PROVIDER_SITE_OTHER): Payer: 59 | Admitting: Obstetrics and Gynecology

## 2022-05-29 ENCOUNTER — Other Ambulatory Visit (HOSPITAL_COMMUNITY)
Admission: RE | Admit: 2022-05-29 | Discharge: 2022-05-29 | Disposition: A | Discharge status: Home / Self Care | Payer: 59 | Source: Ambulatory Visit | Attending: Obstetrics and Gynecology | Admitting: Obstetrics and Gynecology

## 2022-05-29 ENCOUNTER — Encounter: Payer: Self-pay | Admitting: Obstetrics

## 2022-05-29 VITALS — BP 107/74 | HR 102 | Wt 175.2 lb

## 2022-05-29 DIAGNOSIS — O23591 Infection of other part of genital tract in pregnancy, first trimester: Secondary | ICD-10-CM | POA: Diagnosis not present

## 2022-05-29 DIAGNOSIS — F1290 Cannabis use, unspecified, uncomplicated: Secondary | ICD-10-CM

## 2022-05-29 DIAGNOSIS — O09899 Supervision of other high risk pregnancies, unspecified trimester: Secondary | ICD-10-CM

## 2022-05-29 DIAGNOSIS — O0992 Supervision of high risk pregnancy, unspecified, second trimester: Secondary | ICD-10-CM

## 2022-05-29 DIAGNOSIS — A5901 Trichomonal vulvovaginitis: Secondary | ICD-10-CM | POA: Insufficient documentation

## 2022-05-29 DIAGNOSIS — Z3A15 15 weeks gestation of pregnancy: Secondary | ICD-10-CM | POA: Insufficient documentation

## 2022-05-29 DIAGNOSIS — O98812 Other maternal infectious and parasitic diseases complicating pregnancy, second trimester: Secondary | ICD-10-CM

## 2022-05-29 DIAGNOSIS — O09212 Supervision of pregnancy with history of pre-term labor, second trimester: Secondary | ICD-10-CM

## 2022-05-29 DIAGNOSIS — Z369 Encounter for antenatal screening, unspecified: Secondary | ICD-10-CM | POA: Diagnosis not present

## 2022-05-29 DIAGNOSIS — F17210 Nicotine dependence, cigarettes, uncomplicated: Secondary | ICD-10-CM

## 2022-05-29 DIAGNOSIS — O99322 Drug use complicating pregnancy, second trimester: Secondary | ICD-10-CM

## 2022-05-29 DIAGNOSIS — Z0283 Encounter for blood-alcohol and blood-drug test: Secondary | ICD-10-CM | POA: Diagnosis not present

## 2022-05-29 DIAGNOSIS — O99332 Smoking (tobacco) complicating pregnancy, second trimester: Secondary | ICD-10-CM

## 2022-05-29 DIAGNOSIS — Z8759 Personal history of other complications of pregnancy, childbirth and the puerperium: Secondary | ICD-10-CM

## 2022-05-29 DIAGNOSIS — F32A Depression, unspecified: Secondary | ICD-10-CM

## 2022-05-29 DIAGNOSIS — Z113 Encounter for screening for infections with a predominantly sexual mode of transmission: Secondary | ICD-10-CM

## 2022-05-29 DIAGNOSIS — Z98891 History of uterine scar from previous surgery: Secondary | ICD-10-CM

## 2022-05-29 DIAGNOSIS — F419 Anxiety disorder, unspecified: Secondary | ICD-10-CM

## 2022-05-29 DIAGNOSIS — O99342 Other mental disorders complicating pregnancy, second trimester: Secondary | ICD-10-CM

## 2022-05-29 DIAGNOSIS — R7309 Other abnormal glucose: Secondary | ICD-10-CM

## 2022-05-29 DIAGNOSIS — Z8751 Personal history of pre-term labor: Secondary | ICD-10-CM

## 2022-05-29 DIAGNOSIS — O34219 Maternal care for unspecified type scar from previous cesarean delivery: Secondary | ICD-10-CM

## 2022-05-29 DIAGNOSIS — Z2839 Other underimmunization status: Secondary | ICD-10-CM

## 2022-05-29 DIAGNOSIS — Z3480 Encounter for supervision of other normal pregnancy, unspecified trimester: Secondary | ICD-10-CM | POA: Diagnosis not present

## 2022-05-29 LAB — POCT URINALYSIS DIPSTICK OB
Appearance: NORMAL
Bilirubin, UA: NEGATIVE
Blood, UA: NEGATIVE
Glucose, UA: NEGATIVE
Ketones, UA: NEGATIVE
Leukocytes, UA: NEGATIVE
Nitrite, UA: NEGATIVE
Odor: NORMAL
Spec Grav, UA: 1.02 (ref 1.010–1.025)
Urobilinogen, UA: 0.2 E.U./dL
pH, UA: 6 (ref 5.0–8.0)

## 2022-05-29 MED ORDER — PRENATAL GUMMIES 0.18-25 MG PO CHEW: 2.0000 | CHEWABLE_TABLET | Freq: Every day | ORAL | 6 refills | Status: AC

## 2022-05-29 NOTE — Progress Notes (Signed)
ROB: Patient is a 27 y.o. [id number] at [redacted]w[redacted]d who presents for routine OB care.  Pregnancy is complicated by  History of classical cesarean section; History of placental abruption; History of preterm delivery; Pregnancy complicated by tobacco use in first trimester; Anxiety and depression; Marijuana use; Tobacco use in pregnancy; Pregnancy related bilateral lower abdominal pain, antepartum; History of cesarean delivery (x 4); Supervision of other normal pregnancy, antepartum; and Trichomoniasis. Patient denies complaints today.  Has questions as to why she does not receive the progesterone shot this pregnancy, received in her previous pregnancies due to her h/o preterm labor.  Discussed new study findings revealing limited benefit from use of the progesterone shots in pregnancy to prevent PTL and PTB.  Patient notes understanding.   Discussed smoking cessation, reports that she has cut down from 1 1/2 ppd down to 1/2 ppd.  Congratulated patient on her progress, continued to encourage weaning until cessation occurs. Also discussed optional tools and medications if her weaning attempts begin to become futile.   TOC performed for recent trichomonas (and yeast/BV) infection.  Assessed for partner treatment, patient notes she is unsure; advised on protected intercourse with condoms or remaining abstinent from partner until treated.   Reviewed labs, patient with elevated A1c (5.7). Will need early glucola next visit. Also discussed rubella and varicella non-immune status, patient ok to be vaccinated postpartum. RTC in 4 weeks for anatomy scan and AFP and early glucola. Ok to complete MaterniT21 today. Will need to see MD for delivery planning between 28-32 weeks.

## 2022-05-29 NOTE — Progress Notes (Addendum)
ROB [redacted]w[redacted]d: Patient doing well. She requests prescription for prenatals, preferably gummies. Patient inquiring about medication to prevent preterm labor.

## 2022-05-30 LAB — CERVICOVAGINAL ANCILLARY ONLY
Bacterial Vaginitis (gardnerella): POSITIVE — AB
Candida Glabrata: NEGATIVE
Candida Vaginitis: POSITIVE — AB
Chlamydia: NEGATIVE
Comment: NEGATIVE
Comment: NEGATIVE
Comment: NEGATIVE
Comment: NEGATIVE
Comment: NEGATIVE
Comment: NORMAL
Neisseria Gonorrhea: NEGATIVE
Trichomonas: POSITIVE — AB

## 2022-06-01 ENCOUNTER — Other Ambulatory Visit: Payer: Self-pay | Admitting: Obstetrics and Gynecology

## 2022-06-01 DIAGNOSIS — A599 Trichomoniasis, unspecified: Secondary | ICD-10-CM

## 2022-06-01 DIAGNOSIS — B9689 Other specified bacterial agents as the cause of diseases classified elsewhere: Secondary | ICD-10-CM

## 2022-06-01 DIAGNOSIS — B3731 Acute candidiasis of vulva and vagina: Secondary | ICD-10-CM

## 2022-06-01 MED ORDER — FLUCONAZOLE 150 MG PO TABS: 150.0000 mg | ORAL_TABLET | Freq: Once | ORAL | 1 refills | Status: AC

## 2022-06-01 MED ORDER — METRONIDAZOLE 500 MG PO TABS: 500.0000 mg | ORAL_TABLET | Freq: Two times a day (BID) | ORAL | 0 refills | Status: DC

## 2022-06-03 LAB — MATERNIT 21 PLUS CORE, BLOOD
Fetal Fraction: 6
Result (T21): NEGATIVE
Trisomy 13 (Patau syndrome): NEGATIVE
Trisomy 18 (Edwards syndrome): NEGATIVE
Trisomy 21 (Down syndrome): NEGATIVE

## 2022-06-19 ENCOUNTER — Other Ambulatory Visit: Payer: Self-pay

## 2022-06-19 DIAGNOSIS — Z131 Encounter for screening for diabetes mellitus: Secondary | ICD-10-CM

## 2022-06-24 ENCOUNTER — Encounter: Payer: Self-pay | Admitting: Obstetrics and Gynecology

## 2022-06-24 ENCOUNTER — Other Ambulatory Visit: Payer: 59

## 2022-06-24 ENCOUNTER — Ambulatory Visit (INDEPENDENT_AMBULATORY_CARE_PROVIDER_SITE_OTHER): Payer: 59 | Admitting: Obstetrics and Gynecology

## 2022-06-24 ENCOUNTER — Ambulatory Visit (INDEPENDENT_AMBULATORY_CARE_PROVIDER_SITE_OTHER): Payer: 59

## 2022-06-24 VITALS — BP 104/73 | HR 85 | Wt 177.7 lb

## 2022-06-24 DIAGNOSIS — O0992 Supervision of high risk pregnancy, unspecified, second trimester: Secondary | ICD-10-CM

## 2022-06-24 DIAGNOSIS — Z3689 Encounter for other specified antenatal screening: Secondary | ICD-10-CM

## 2022-06-24 DIAGNOSIS — Z3A19 19 weeks gestation of pregnancy: Secondary | ICD-10-CM

## 2022-06-24 DIAGNOSIS — Z131 Encounter for screening for diabetes mellitus: Secondary | ICD-10-CM

## 2022-06-24 DIAGNOSIS — O34219 Maternal care for unspecified type scar from previous cesarean delivery: Secondary | ICD-10-CM

## 2022-06-24 LAB — POCT URINALYSIS DIPSTICK OB
Bilirubin, UA: NEGATIVE
Blood, UA: NEGATIVE
Glucose, UA: NEGATIVE
Ketones, UA: NEGATIVE
Leukocytes, UA: NEGATIVE
Nitrite, UA: NEGATIVE
POC,PROTEIN,UA: NEGATIVE
Spec Grav, UA: 1.01 (ref 1.010–1.025)
Urobilinogen, UA: 0.2 E.U./dL
pH, UA: 7 (ref 5.0–8.0)

## 2022-06-24 NOTE — Progress Notes (Signed)
ROB. Patient states increased fetal movement with pelvic pressure. Completed early 1 hr and anatomy ultrasound today. She  states no questions or concerns at this time.

## 2022-06-24 NOTE — Progress Notes (Signed)
ROB: Doing well.  1 hour GCT today (early because of elevated A1c).  History of 4 prior cesarean deliveries including a classical.  She will need to deliver at 37 weeks.  10/21 her likely cesarean delivery date.  Alyssa Mancera and [place] to do together.  This should be scheduled somewhere around end of the second trimester.  Anatomy scan today.

## 2022-06-25 LAB — GLUCOSE, 1 HOUR GESTATIONAL: Gestational Diabetes Screen: 78 mg/dL (ref 70–139)

## 2022-07-22 ENCOUNTER — Telehealth: Payer: Self-pay

## 2022-07-22 DIAGNOSIS — F1290 Cannabis use, unspecified, uncomplicated: Secondary | ICD-10-CM

## 2022-07-22 DIAGNOSIS — Z8759 Personal history of other complications of pregnancy, childbirth and the puerperium: Secondary | ICD-10-CM

## 2022-07-22 DIAGNOSIS — Z8751 Personal history of pre-term labor: Secondary | ICD-10-CM

## 2022-07-22 DIAGNOSIS — A599 Trichomoniasis, unspecified: Secondary | ICD-10-CM

## 2022-07-22 DIAGNOSIS — Z3480 Encounter for supervision of other normal pregnancy, unspecified trimester: Secondary | ICD-10-CM

## 2022-07-22 NOTE — Telephone Encounter (Signed)
Reached out to pt to reschedule ROB that was scheduled on 07/22/2022 with S. Free at 8:35.  Could not leave a message, number was not working.

## 2022-07-23 NOTE — Telephone Encounter (Signed)
I contacted the patient via phone, no answer, " call could not be complete at this time". Will send mychart missed appointment letter.

## 2022-08-26 ENCOUNTER — Telehealth: Payer: Self-pay | Admitting: Certified Nurse Midwife

## 2022-08-26 ENCOUNTER — Encounter: Payer: 59 | Admitting: Certified Nurse Midwife

## 2022-08-26 NOTE — Telephone Encounter (Signed)
Reached out to pt to reschedule ROB appt that was scheduled on 08/26/2022 at 3:55 with J. Keitha Butte.  Could not leave a message, bc number is not in service.

## 2022-08-27 NOTE — Telephone Encounter (Signed)
Reached out to pt (2x) to reschedule ROB appt that was scheduled on 08/26/2022 at 3:55 with J. Keitha Butte.  Could not leave a message, bc number is not in service.  Will send a MyChart letter and send a MyChart message to reach the pt.

## 2022-09-24 ENCOUNTER — Encounter: Payer: 59 | Admitting: Obstetrics

## 2022-09-24 DIAGNOSIS — Z3A32 32 weeks gestation of pregnancy: Secondary | ICD-10-CM

## 2022-09-24 DIAGNOSIS — O0990 Supervision of high risk pregnancy, unspecified, unspecified trimester: Secondary | ICD-10-CM

## 2022-09-29 ENCOUNTER — Ambulatory Visit (INDEPENDENT_AMBULATORY_CARE_PROVIDER_SITE_OTHER): Payer: 59 | Admitting: Obstetrics

## 2022-09-29 ENCOUNTER — Other Ambulatory Visit (HOSPITAL_COMMUNITY)
Admission: RE | Admit: 2022-09-29 | Discharge: 2022-09-29 | Disposition: A | Discharge status: Home / Self Care | Payer: 59 | Source: Ambulatory Visit | Attending: Certified Nurse Midwife | Admitting: Certified Nurse Midwife

## 2022-09-29 VITALS — BP 126/83 | HR 91 | Wt 192.0 lb

## 2022-09-29 DIAGNOSIS — Z23 Encounter for immunization: Secondary | ICD-10-CM

## 2022-09-29 DIAGNOSIS — N760 Acute vaginitis: Secondary | ICD-10-CM | POA: Insufficient documentation

## 2022-09-29 DIAGNOSIS — O0993 Supervision of high risk pregnancy, unspecified, third trimester: Secondary | ICD-10-CM

## 2022-09-29 DIAGNOSIS — Z3A33 33 weeks gestation of pregnancy: Secondary | ICD-10-CM

## 2022-09-29 DIAGNOSIS — Z113 Encounter for screening for infections with a predominantly sexual mode of transmission: Secondary | ICD-10-CM | POA: Insufficient documentation

## 2022-09-29 DIAGNOSIS — F1290 Cannabis use, unspecified, uncomplicated: Secondary | ICD-10-CM

## 2022-09-29 DIAGNOSIS — B9689 Other specified bacterial agents as the cause of diseases classified elsewhere: Secondary | ICD-10-CM | POA: Insufficient documentation

## 2022-09-29 DIAGNOSIS — A5901 Trichomonal vulvovaginitis: Secondary | ICD-10-CM | POA: Insufficient documentation

## 2022-09-29 LAB — POCT URINALYSIS DIPSTICK OB
Bilirubin, UA: NEGATIVE
Blood, UA: NEGATIVE
Glucose, UA: NEGATIVE
Ketones, UA: NEGATIVE
Nitrite, UA: NEGATIVE
Urobilinogen, UA: 0.2 E.U./dL
pH, UA: 5 (ref 5.0–8.0)
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
## 2022-09-29 NOTE — Addendum Note (Signed)
Addended by: Donnetta Hail on: 09/29/2022 09:14 AM   Modules accepted: Orders

## 2022-09-30 LAB — CERVICOVAGINAL ANCILLARY ONLY
Bacterial Vaginitis (gardnerella): POSITIVE — AB
Chlamydia: NEGATIVE
Comment: NEGATIVE
Comment: NEGATIVE
Comment: NEGATIVE
Comment: NORMAL
Neisseria Gonorrhea: NEGATIVE
Trichomonas: POSITIVE — AB

## 2022-10-01 ENCOUNTER — Encounter: Payer: Self-pay | Admitting: Obstetrics

## 2022-10-01 ENCOUNTER — Other Ambulatory Visit: Payer: Self-pay | Admitting: Obstetrics

## 2022-10-01 DIAGNOSIS — O0993 Supervision of high risk pregnancy, unspecified, third trimester: Secondary | ICD-10-CM

## 2022-10-01 DIAGNOSIS — A599 Trichomoniasis, unspecified: Secondary | ICD-10-CM

## 2022-10-01 MED ORDER — METRONIDAZOLE 500 MG PO TABS
500.0000 mg | ORAL_TABLET | Freq: Two times a day (BID) | ORAL | 1 refills | Status: AC
Start: 2022-10-01 — End: 2022-10-08

## 2022-10-01 MED ORDER — METRONIDAZOLE 500 MG PO TABS: 500.0000 mg | ORAL_TABLET | Freq: Two times a day (BID) | ORAL | 0 refills | Status: DC

## 2022-10-01 NOTE — Progress Notes (Unsigned)
Patient has again tested positive for

## 2022-10-02 LAB — URINE DRUG PANEL 7
Amphetamines, Urine: NEGATIVE ng/mL
Barbiturate Quant, Ur: NEGATIVE ng/mL
Benzodiazepine Quant, Ur: NEGATIVE ng/mL
Cannabinoid Quant, Ur: POSITIVE — AB
Cocaine (Metab.): NEGATIVE ng/mL
Opiate Quant, Ur: NEGATIVE ng/mL
PCP Quant, Ur: NEGATIVE ng/mL

## 2022-10-13 ENCOUNTER — Encounter: Payer: 59 | Admitting: Obstetrics & Gynecology

## 2022-10-16 ENCOUNTER — Ambulatory Visit (INDEPENDENT_AMBULATORY_CARE_PROVIDER_SITE_OTHER): Payer: 59 | Admitting: Obstetrics and Gynecology

## 2022-10-16 ENCOUNTER — Encounter: Payer: Self-pay | Admitting: Obstetrics and Gynecology

## 2022-10-16 DIAGNOSIS — Z131 Encounter for screening for diabetes mellitus: Secondary | ICD-10-CM | POA: Diagnosis not present

## 2022-10-16 DIAGNOSIS — O0993 Supervision of high risk pregnancy, unspecified, third trimester: Secondary | ICD-10-CM

## 2022-10-16 DIAGNOSIS — Z3685 Encounter for antenatal screening for Streptococcus B: Secondary | ICD-10-CM

## 2022-10-16 DIAGNOSIS — Z113 Encounter for screening for infections with a predominantly sexual mode of transmission: Secondary | ICD-10-CM

## 2022-10-16 DIAGNOSIS — O0933 Supervision of pregnancy with insufficient antenatal care, third trimester: Secondary | ICD-10-CM

## 2022-10-16 DIAGNOSIS — Z01818 Encounter for other preprocedural examination: Secondary | ICD-10-CM

## 2022-10-16 DIAGNOSIS — Z130 Encounter for screening for diseases of the blood and blood-forming organs and certain disorders involving the immune mechanism: Secondary | ICD-10-CM

## 2022-10-16 DIAGNOSIS — Z3A35 35 weeks gestation of pregnancy: Secondary | ICD-10-CM

## 2022-10-16 LAB — POCT URINALYSIS DIPSTICK OB
Bilirubin, UA: NEGATIVE
Glucose, UA: NEGATIVE
Ketones, UA: NEGATIVE
Nitrite, UA: NEGATIVE
POC,PROTEIN,UA: NEGATIVE
Spec Grav, UA: 1.015 (ref 1.010–1.025)
Urobilinogen, UA: 0.2 U/dL
pH, UA: 6 (ref 5.0–8.0)

## 2022-10-16 MED ORDER — TINIDAZOLE 500 MG PO TABS: 2.0000 g | ORAL_TABLET | Freq: Every day | ORAL | 2 refills | Status: DC

## 2022-10-16 MED ORDER — SOLOSEC 2 G PO PACK: 1.0000 | PACK | Freq: Once | ORAL | 0 refills | Status: AC
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
## 2022-10-17 ENCOUNTER — Telehealth: Payer: Self-pay

## 2022-10-17 LAB — 28 WEEK RH+PANEL
Basophils Absolute: 0 10*3/uL (ref 0.0–0.2)
Basos: 1 %
EOS (ABSOLUTE): 0.1 10*3/uL (ref 0.0–0.4)
Eos: 2 %
Gestational Diabetes Screen: 107 mg/dL (ref 70–139)
HIV Screen 4th Generation wRfx: NONREACTIVE
Hematocrit: 35.7 % (ref 34.0–46.6)
Hemoglobin: 11.7 g/dL (ref 11.1–15.9)
Immature Grans (Abs): 0 10*3/uL (ref 0.0–0.1)
Immature Granulocytes: 1 %
Lymphocytes Absolute: 2.5 10*3/uL (ref 0.7–3.1)
Lymphs: 41 %
MCH: 28.8 pg (ref 26.6–33.0)
MCHC: 32.8 g/dL (ref 31.5–35.7)
MCV: 88 fL (ref 79–97)
Monocytes Absolute: 0.4 10*3/uL (ref 0.1–0.9)
Monocytes: 6 %
Neutrophils Absolute: 3.1 10*3/uL (ref 1.4–7.0)
Neutrophils: 49 %
Platelets: 117 10*3/uL — ABNORMAL LOW (ref 150–450)
RBC: 4.06 x10E6/uL (ref 3.77–5.28)
RDW: 12.5 % (ref 11.7–15.4)
RPR Ser Ql: NONREACTIVE
WBC: 6.1 10*3/uL (ref 3.4–10.8)

## 2022-10-17 NOTE — Telephone Encounter (Signed)
PA has been started. Waiting on clinical questions or response.

## 2022-10-18 LAB — STREP GP B NAA: Strep Gp B NAA: POSITIVE — AB

## 2022-10-20 NOTE — Telephone Encounter (Signed)
Approved.  

## 2022-10-22 ENCOUNTER — Other Ambulatory Visit: Payer: Self-pay

## 2022-10-22 ENCOUNTER — Encounter
Admission: RE | Admit: 2022-10-22 | Discharge: 2022-10-22 | Disposition: A | Discharge status: Home / Self Care | Payer: 59 | Source: Ambulatory Visit | Attending: Obstetrics and Gynecology | Admitting: Obstetrics and Gynecology

## 2022-10-22 HISTORY — DX: Personal history of other complications of pregnancy, childbirth and the puerperium: Z87.59

## 2022-10-22 NOTE — Patient Instructions (Addendum)
Your procedure is scheduled on: Monday, October 21  Arrival Time: Please call Labor and Delivery the day before your scheduled C-Section to find out your arrival time. [phone].  Arrival: If your arrival time is prior to 6:00 am, please enter through the Emergency Room Entrance and you will be directed to Labor and Delivery. If your arrival time is 6:00 am or later, please enter the Medical Mall and follow the greeter's instructions.  REMEMBER: Instructions that are not followed completely may result in serious medical risk, up to and including death; or upon the discretion of your surgeon and anesthesiologist your surgery may need to be rescheduled.  Do not eat or drink after midnight the night before surgery.  No gum chewing or hard candies.  One week prior to surgery: Stop Anti-inflammatories (NSAIDS) such as Advil, Aleve, Ibuprofen, Motrin, Naproxen, Naprosyn and Aspirin based products such as Excedrin, Goody's Powder, BC Powder. Stop ANY OVER THE COUNTER supplements until after surgery.  You may however, continue to take Tylenol if needed for pain up until the day of surgery.  Continue taking all of your other prescription medications up until the day of surgery.  ON THE DAY OF SURGERY DO NOT TAKE ANY MEDICATIONS   No Alcohol for 24 hours before or after surgery.  No Smoking including e-cigarettes for 24 hours prior to surgery.  No chewable tobacco products for at least 6 hours prior to surgery.  No nicotine patches on the day of surgery.  Do not use any "recreational" drugs for at least a week prior to your surgery.  Please be advised that the combination of cocaine and anesthesia may have negative outcomes, up to and including death. If you test positive for cocaine, your surgery will be cancelled.  On the morning of surgery brush your teeth with toothpaste and water, you may rinse your mouth with mouthwash if you wish. Do not swallow any toothpaste or mouthwash.  Use  CHG wipes as directed on instruction sheet.  Do not wear jewelry, make-up, hairpins, clips or nail polish.  For welded (permanent) jewelry: bracelets, anklets, waist bands, etc.  Please have this removed prior to surgery.  If it is not removed, there is a chance that hospital personnel will need to cut it off on the day of surgery.  Do not wear lotions, powders, or perfumes.   Do not shave body hair from the neck down 48 hours before surgery.  Contact lenses, hearing aids and dentures may not be worn into surgery.  Do not bring valuables to the hospital. [facility] is not responsible for any missing/lost belongings or valuables.   Notify your doctor if there is any change in your medical condition (cold, fever, infection).  Wear comfortable clothing (specific to your surgery type) to the hospital.  After surgery, you can help prevent lung complications by doing breathing exercises.  Take deep breaths and cough every 1-2 hours. Your doctor may order a device called an Incentive Spirometer to help you take deep breaths. When coughing or sneezing, hold a pillow firmly against your incision with both hands. This is called "splinting." Doing this helps protect your incision. It also decreases belly discomfort.  Please call the Pre-admissions Testing Dept. at [phone] if you have any questions about these instructions.  Surgery Visitation Policy:  Visitor Passes   All visitors, including children, need an identification sticker when visiting. These stickers must be worn where they can be seen.   Labor & Delivery  Laboring women  may have one designated support person and two other visitors of any age visit. The support person must remain the same. The visitors may switch with other visitors. Visitation is permitted 24 hours per day. The designated support person or a visitor over the age of 16 may sleep overnight in the patient's room. A doula registered with [facility] for  labor and delivery support is not considered a visitor. Doulas not registered with [facility] are considered visitors.  Mother Baby Unit, OB Specialty and Gynecological Care  A designated support person and three visitors of any age may visit. The three visitors may switch out. The designated support person or a visitor age 21 or older may stay overnight in the room. During the postpartum period (up to 6 weeks), if the mother is the patient, she can have her newborn stay with her if there is another support person present who can be responsible for the baby.     Preparing the Skin Before Surgery     To help prevent the risk of infection at your surgical site, we are now providing you with rinse-free Sage 2% Chlorhexidine Gluconate (CHG) disposable wipes.  Chlorhexidine Gluconate (CHG) Soap  o An antiseptic cleaner that kills germs and bonds with the skin to continue killing germs even after washing  o Used for showering the night before surgery and morning of surgery  The night before surgery: Shower or bathe with warm water. Do not apply perfume, lotions, powders. Wait one hour after shower. Skin should be dry and cool. Open Sage wipe package - use 6 disposable cloths. Wipe body using one cloth for the right arm, one cloth for the left arm, one cloth for the right leg, one cloth for the left leg, one cloth for the chest/abdomen area, and one cloth for the back. Do not use on open wounds or sores. Do not use on face or genitals (private parts). If you are breast feeding, do not use on breasts. 5. Do not rinse, allow to dry. 6. Skin may feel "tacky" for several minutes. 7. Dress in clean clothes. 8. Place clean sheets on your bed and do not sleep with pets.  REPEAT ABOVE ON THE MORNING OF SURGERY BEFORE ARRIVING TO THE HOSPITAL.

## 2022-10-23 ENCOUNTER — Ambulatory Visit (INDEPENDENT_AMBULATORY_CARE_PROVIDER_SITE_OTHER): Payer: 59 | Admitting: Obstetrics and Gynecology

## 2022-10-23 ENCOUNTER — Encounter: Payer: Self-pay | Admitting: Urgent Care

## 2022-10-23 ENCOUNTER — Encounter
Admission: RE | Admit: 2022-10-23 | Discharge: 2022-10-23 | Disposition: A | Discharge status: Home / Self Care | Payer: 59 | Source: Ambulatory Visit | Attending: Obstetrics and Gynecology | Admitting: Obstetrics and Gynecology

## 2022-10-23 ENCOUNTER — Encounter: Payer: Self-pay | Admitting: Obstetrics and Gynecology

## 2022-10-23 VITALS — BP 125/87 | HR 92 | Wt 199.6 lb

## 2022-10-23 DIAGNOSIS — Z3A36 36 weeks gestation of pregnancy: Secondary | ICD-10-CM

## 2022-10-23 DIAGNOSIS — Z01812 Encounter for preprocedural laboratory examination: Secondary | ICD-10-CM | POA: Insufficient documentation

## 2022-10-23 DIAGNOSIS — Z01818 Encounter for other preprocedural examination: Secondary | ICD-10-CM

## 2022-10-23 DIAGNOSIS — O0993 Supervision of high risk pregnancy, unspecified, third trimester: Secondary | ICD-10-CM

## 2022-10-23 LAB — CBC
HCT: 37.2 % (ref 36.0–46.0)
Hemoglobin: 12.4 g/dL (ref 12.0–15.0)
MCH: 28.8 pg (ref 26.0–34.0)
MCHC: 33.3 g/dL (ref 30.0–36.0)
MCV: 86.5 fL (ref 80.0–100.0)
Platelets: 123 10*3/uL — ABNORMAL LOW (ref 150–400)
RBC: 4.3 MIL/uL (ref 3.87–5.11)
RDW: 13.2 % (ref 11.5–15.5)
WBC: 6.8 10*3/uL (ref 4.0–10.5)
nRBC: 0 % (ref 0.0–0.2)

## 2022-10-23 LAB — RAPID HIV SCREEN (HIV 1/2 AB+AG)
HIV 1/2 Antibodies: NONREACTIVE
HIV-1 P24 Antigen - HIV24: NONREACTIVE

## 2022-10-23 NOTE — Progress Notes (Signed)
ROB: Patient reported that she is not having any problems.  Reports very active baby.  While in the exam room she received a call that her mother had passed.  She was understandably upset and wanted to get out as soon as possible.  She reports that she has no questions with or about her C-section on Monday.

## 2022-10-23 NOTE — Progress Notes (Signed)
ROB. Patient states daily fetal movement.She states no questions or concerns at this time.

## 2022-10-24 LAB — RPR: RPR Ser Ql: NONREACTIVE

## 2022-10-27 ENCOUNTER — Inpatient Hospital Stay
Admission: RE | Admit: 2022-10-27 | Discharge: 2022-10-30 | DRG: 784 | Disposition: A | Discharge status: Home / Self Care | Payer: 59 | Attending: Obstetrics and Gynecology | Admitting: Obstetrics and Gynecology

## 2022-10-27 ENCOUNTER — Other Ambulatory Visit: Payer: Self-pay

## 2022-10-27 ENCOUNTER — Encounter
Admission: RE | Disposition: A | Discharge status: Home / Self Care | Payer: Self-pay | Source: Home / Self Care | Attending: Obstetrics and Gynecology

## 2022-10-27 ENCOUNTER — Inpatient Hospital Stay: Payer: Self-pay

## 2022-10-27 ENCOUNTER — Encounter: Payer: Self-pay | Admitting: Obstetrics and Gynecology

## 2022-10-27 ENCOUNTER — Inpatient Hospital Stay: Payer: 59

## 2022-10-27 DIAGNOSIS — Z8249 Family history of ischemic heart disease and other diseases of the circulatory system: Secondary | ICD-10-CM

## 2022-10-27 DIAGNOSIS — O99334 Smoking (tobacco) complicating childbirth: Secondary | ICD-10-CM | POA: Diagnosis present

## 2022-10-27 DIAGNOSIS — O0933 Supervision of pregnancy with insufficient antenatal care, third trimester: Secondary | ICD-10-CM

## 2022-10-27 DIAGNOSIS — Z3480 Encounter for supervision of other normal pregnancy, unspecified trimester: Principal | ICD-10-CM

## 2022-10-27 DIAGNOSIS — O34211 Maternal care for low transverse scar from previous cesarean delivery: Secondary | ICD-10-CM | POA: Diagnosis not present

## 2022-10-27 DIAGNOSIS — Z98891 History of uterine scar from previous surgery: Secondary | ICD-10-CM

## 2022-10-27 DIAGNOSIS — Z23 Encounter for immunization: Secondary | ICD-10-CM | POA: Diagnosis not present

## 2022-10-27 DIAGNOSIS — F17210 Nicotine dependence, cigarettes, uncomplicated: Secondary | ICD-10-CM | POA: Diagnosis present

## 2022-10-27 DIAGNOSIS — O34212 Maternal care for vertical scar from previous cesarean delivery: Secondary | ICD-10-CM | POA: Diagnosis not present

## 2022-10-27 DIAGNOSIS — Z5986 Financial insecurity: Secondary | ICD-10-CM

## 2022-10-27 DIAGNOSIS — K219 Gastro-esophageal reflux disease without esophagitis: Secondary | ICD-10-CM | POA: Diagnosis present

## 2022-10-27 DIAGNOSIS — Z2839 Other underimmunization status: Secondary | ICD-10-CM

## 2022-10-27 DIAGNOSIS — Z833 Family history of diabetes mellitus: Secondary | ICD-10-CM | POA: Diagnosis not present

## 2022-10-27 DIAGNOSIS — A599 Trichomoniasis, unspecified: Secondary | ICD-10-CM | POA: Diagnosis present

## 2022-10-27 DIAGNOSIS — O9962 Diseases of the digestive system complicating childbirth: Secondary | ICD-10-CM | POA: Diagnosis not present

## 2022-10-27 DIAGNOSIS — F32A Depression, unspecified: Secondary | ICD-10-CM | POA: Diagnosis present

## 2022-10-27 DIAGNOSIS — O99330 Smoking (tobacco) complicating pregnancy, unspecified trimester: Secondary | ICD-10-CM | POA: Diagnosis present

## 2022-10-27 DIAGNOSIS — Z8759 Personal history of other complications of pregnancy, childbirth and the puerperium: Secondary | ICD-10-CM

## 2022-10-27 DIAGNOSIS — Z823 Family history of stroke: Secondary | ICD-10-CM

## 2022-10-27 DIAGNOSIS — O09899 Supervision of other high risk pregnancies, unspecified trimester: Secondary | ICD-10-CM

## 2022-10-27 DIAGNOSIS — O9882 Other maternal infectious and parasitic diseases complicating childbirth: Secondary | ICD-10-CM | POA: Diagnosis not present

## 2022-10-27 DIAGNOSIS — O99824 Streptococcus B carrier state complicating childbirth: Secondary | ICD-10-CM | POA: Diagnosis not present

## 2022-10-27 DIAGNOSIS — F1290 Cannabis use, unspecified, uncomplicated: Secondary | ICD-10-CM | POA: Diagnosis present

## 2022-10-27 DIAGNOSIS — Z3A37 37 weeks gestation of pregnancy: Secondary | ICD-10-CM | POA: Diagnosis not present

## 2022-10-27 DIAGNOSIS — O99893 Other specified diseases and conditions complicating puerperium: Secondary | ICD-10-CM | POA: Diagnosis not present

## 2022-10-27 DIAGNOSIS — O99324 Drug use complicating childbirth: Secondary | ICD-10-CM | POA: Diagnosis not present

## 2022-10-27 DIAGNOSIS — Z302 Encounter for sterilization: Secondary | ICD-10-CM | POA: Diagnosis not present

## 2022-10-27 DIAGNOSIS — Z3202 Encounter for pregnancy test, result negative: Secondary | ICD-10-CM

## 2022-10-27 DIAGNOSIS — O9912 Other diseases of the blood and blood-forming organs and certain disorders involving the immune mechanism complicating childbirth: Secondary | ICD-10-CM | POA: Diagnosis present

## 2022-10-27 DIAGNOSIS — F1210 Cannabis abuse, uncomplicated: Secondary | ICD-10-CM | POA: Diagnosis present

## 2022-10-27 DIAGNOSIS — Z01818 Encounter for other preprocedural examination: Secondary | ICD-10-CM

## 2022-10-27 DIAGNOSIS — O34219 Maternal care for unspecified type scar from previous cesarean delivery: Secondary | ICD-10-CM | POA: Diagnosis not present

## 2022-10-27 DIAGNOSIS — D696 Thrombocytopenia, unspecified: Secondary | ICD-10-CM | POA: Diagnosis not present

## 2022-10-27 DIAGNOSIS — R030 Elevated blood-pressure reading, without diagnosis of hypertension: Secondary | ICD-10-CM | POA: Diagnosis present

## 2022-10-27 DIAGNOSIS — Z412 Encounter for routine and ritual male circumcision: Secondary | ICD-10-CM | POA: Diagnosis not present

## 2022-10-27 DIAGNOSIS — Z051 Observation and evaluation of newborn for suspected infectious condition ruled out: Secondary | ICD-10-CM | POA: Diagnosis not present

## 2022-10-27 DIAGNOSIS — Z8751 Personal history of pre-term labor: Secondary | ICD-10-CM

## 2022-10-27 LAB — COMPREHENSIVE METABOLIC PANEL
ALT: 13 U/L (ref 0–44)
AST: 21 U/L (ref 15–41)
Albumin: 2.8 g/dL — ABNORMAL LOW (ref 3.5–5.0)
Alkaline Phosphatase: 114 U/L (ref 38–126)
Anion gap: 6 (ref 5–15)
BUN: 9 mg/dL (ref 6–20)
CO2: 22 mmol/L (ref 22–32)
Calcium: 8.4 mg/dL — ABNORMAL LOW (ref 8.9–10.3)
Chloride: 105 mmol/L (ref 98–111)
Creatinine, Ser: 0.65 mg/dL (ref 0.44–1.00)
GFR, Estimated: >60 mL/min (ref >60)
Glucose, Bld: 82 mg/dL (ref 70–99)
Potassium: 4.2 mmol/L (ref 3.5–5.1)
Sodium: 133 mmol/L — ABNORMAL LOW (ref 135–145)
Total Bilirubin: 0.3 mg/dL (ref 0.3–1.2)
Total Protein: 6.1 g/dL — ABNORMAL LOW (ref 6.5–8.1)

## 2022-10-27 LAB — CBC
HCT: 34.4 % — ABNORMAL LOW (ref 36.0–46.0)
Hemoglobin: 11.6 g/dL — ABNORMAL LOW (ref 12.0–15.0)
MCH: 28.8 pg (ref 26.0–34.0)
MCHC: 33.7 g/dL (ref 30.0–36.0)
MCV: 85.4 fL (ref 80.0–100.0)
Platelets: 124 10*3/uL — ABNORMAL LOW (ref 150–400)
RBC: 4.03 MIL/uL (ref 3.87–5.11)
RDW: 13 % (ref 11.5–15.5)
WBC: 10.2 10*3/uL (ref 4.0–10.5)
nRBC: 0 % (ref 0.0–0.2)

## 2022-10-27 LAB — TYPE AND SCREEN
ABO/RH(D): O POS
Antibody Screen: NEGATIVE

## 2022-10-27 LAB — URINE DRUG SCREEN, QUALITATIVE (ARMC ONLY)
Amphetamines, Ur Screen: NOT DETECTED
Barbiturates, Ur Screen: NOT DETECTED
Benzodiazepine, Ur Scrn: NOT DETECTED
Cannabinoid 50 Ng, Ur ~~LOC~~: POSITIVE — AB
Cocaine Metabolite,Ur ~~LOC~~: NOT DETECTED
MDMA (Ecstasy)Ur Screen: NOT DETECTED
Methadone Scn, Ur: NOT DETECTED
Opiate, Ur Screen: NOT DETECTED
Phencyclidine (PCP) Ur S: NOT DETECTED
Tricyclic, Ur Screen: NOT DETECTED

## 2022-10-27 LAB — PROTEIN / CREATININE RATIO, URINE
Creatinine, Urine: 101 mg/dL
Protein Creatinine Ratio: 0.09 mg/mg{creat} (ref 0.00–0.15)
Total Protein, Urine: 9 mg/dL

## 2022-10-27 SURGERY — Surgical Case
Anesthesia: Spinal | Laterality: Bilateral

## 2022-10-27 MED ORDER — DIPHENHYDRAMINE HCL 50 MG/ML IJ SOLN
12.5000 mg | INTRAMUSCULAR | Status: DC | PRN
  Administered 2022-10-27 (×2): 12.5 mg via INTRAVENOUS
  Filled 2022-10-27 (×2): qty 1

## 2022-10-27 MED ORDER — SENNOSIDES 8.8 MG/5ML PO SYRP
10.0000 mL | ORAL_SOLUTION | Freq: Every day | ORAL | Status: DC
  Administered 2022-10-27 – 2022-10-29 (×2): 10 mL via ORAL
  Filled 2022-10-27 (×3): qty 10

## 2022-10-27 MED ORDER — FENTANYL CITRATE (PF) 100 MCG/2ML IJ SOLN
25.0000 ug | INTRAMUSCULAR | Status: DC | PRN
Start: 2022-10-27 — End: 2022-10-27
  Administered 2022-10-27: 50 ug via INTRAVENOUS
  Filled 2022-10-27: qty 2

## 2022-10-27 MED ORDER — DIBUCAINE (PERIANAL) 1 % EX OINT: 1.0000 | TOPICAL_OINTMENT | CUTANEOUS | Status: DC | PRN

## 2022-10-27 MED ORDER — MORPHINE SULFATE (PF) 0.5 MG/ML IJ SOLN
INTRAMUSCULAR | Status: AC
  Filled 2022-10-27: qty 10

## 2022-10-27 MED ORDER — SOD CITRATE-CITRIC ACID 500-334 MG/5ML PO SOLN
30.0000 mL | ORAL | Status: AC
  Administered 2022-10-27: 30 mL via ORAL

## 2022-10-27 MED ORDER — NALOXONE HCL 0.4 MG/ML IJ SOLN: 0.4000 mg | INTRAMUSCULAR | Status: DC | PRN

## 2022-10-27 MED ORDER — OXYCODONE HCL 5 MG PO TABS
5.0000 mg | ORAL_TABLET | Freq: Once | ORAL | Status: DC | PRN
Start: 2022-10-27 — End: 2022-10-27

## 2022-10-27 MED ORDER — IBUPROFEN 100 MG/5ML PO SUSP
600.0000 mg | Freq: Four times a day (QID) | ORAL | Status: DC | PRN
  Administered 2022-10-29 – 2022-10-30 (×4): 600 mg via ORAL
  Filled 2022-10-27 (×8): qty 30

## 2022-10-27 MED ORDER — MENTHOL 3 MG MT LOZG
1.0000 | LOZENGE | OROMUCOSAL | Status: DC | PRN
Start: 2022-10-27 — End: 2022-10-30

## 2022-10-27 MED ORDER — ACETAMINOPHEN 500 MG PO TABS
1000.0000 mg | ORAL_TABLET | ORAL | Status: DC
  Filled 2022-10-27: qty 2

## 2022-10-27 MED ORDER — GABAPENTIN 100 MG PO CAPS
100.0000 mg | ORAL_CAPSULE | Freq: Three times a day (TID) | ORAL | Status: DC
  Administered 2022-10-27 – 2022-10-29 (×3): 100 mg via ORAL
  Filled 2022-10-27 (×6): qty 1

## 2022-10-27 MED ORDER — ZOLPIDEM TARTRATE 5 MG PO TABS: 5.0000 mg | ORAL_TABLET | Freq: Every evening | ORAL | Status: DC | PRN

## 2022-10-27 MED ORDER — OXYCODONE HCL 5 MG/5ML PO SOLN: 5.0000 mg | Freq: Once | ORAL | Status: DC | PRN

## 2022-10-27 MED ORDER — LACTATED RINGERS IV SOLN: INTRAVENOUS | Status: DC

## 2022-10-27 MED ORDER — LIDOCAINE 5 % EX PTCH
1.0000 | MEDICATED_PATCH | CUTANEOUS | Status: DC
  Administered 2022-10-28: 1 via TRANSDERMAL
  Filled 2022-10-27: qty 1

## 2022-10-27 MED ORDER — KETOROLAC TROMETHAMINE 30 MG/ML IJ SOLN
30.0000 mg | Freq: Four times a day (QID) | INTRAMUSCULAR | Status: AC
  Administered 2022-10-27 – 2022-10-28 (×3): 30 mg via INTRAVENOUS
  Filled 2022-10-27 (×3): qty 1

## 2022-10-27 MED ORDER — MEPERIDINE HCL 25 MG/ML IJ SOLN
6.2500 mg | INTRAMUSCULAR | Status: DC | PRN
Start: 2022-10-27 — End: 2022-10-27

## 2022-10-27 MED ORDER — OXYCODONE HCL 5 MG/5ML PO SOLN
5.0000 mg | ORAL | Status: DC | PRN
  Administered 2022-10-28 – 2022-10-30 (×5): 10 mg via ORAL
  Filled 2022-10-27 (×2): qty 10
  Filled 2022-10-27: qty 5
  Filled 2022-10-27 (×2): qty 10
  Filled 2022-10-27: qty 5
  Filled 2022-10-27 (×2): qty 10
  Filled 2022-10-27: qty 5
  Filled 2022-10-27: qty 10

## 2022-10-27 MED ORDER — SODIUM CHLORIDE 0.9% FLUSH: 3.0000 mL | INTRAVENOUS | Status: DC | PRN

## 2022-10-27 MED ORDER — SIMETHICONE 80 MG PO CHEW
80.0000 mg | CHEWABLE_TABLET | Freq: Three times a day (TID) | ORAL | Status: DC
  Administered 2022-10-28 – 2022-10-30 (×8): 80 mg via ORAL
  Filled 2022-10-27 (×7): qty 1

## 2022-10-27 MED ORDER — DEXAMETHASONE SODIUM PHOSPHATE 10 MG/ML IJ SOLN
INTRAMUSCULAR | Status: DC | PRN
  Administered 2022-10-27: 10 mg via INTRAVENOUS

## 2022-10-27 MED ORDER — PHENYLEPHRINE HCL-NACL 20-0.9 MG/250ML-% IV SOLN
INTRAVENOUS | Status: DC | PRN
  Administered 2022-10-27: 50 ug/min via INTRAVENOUS

## 2022-10-27 MED ORDER — OXYTOCIN-SODIUM CHLORIDE 30-0.9 UT/500ML-% IV SOLN
INTRAVENOUS | Status: AC
  Filled 2022-10-27: qty 500

## 2022-10-27 MED ORDER — KETOROLAC TROMETHAMINE 30 MG/ML IJ SOLN
30.0000 mg | Freq: Four times a day (QID) | INTRAMUSCULAR | Status: DC
  Administered 2022-10-27: 30 mg via INTRAVENOUS
  Filled 2022-10-27: qty 1

## 2022-10-27 MED ORDER — POVIDONE-IODINE 10 % EX SWAB: 2.0000 | Freq: Once | CUTANEOUS | Status: DC

## 2022-10-27 MED ORDER — OXYTOCIN-SODIUM CHLORIDE 30-0.9 UT/500ML-% IV SOLN: 2.5000 [IU]/h | INTRAVENOUS | Status: AC

## 2022-10-27 MED ORDER — DIPHENHYDRAMINE HCL 25 MG PO CAPS: 25.0000 mg | ORAL_CAPSULE | ORAL | Status: DC | PRN

## 2022-10-27 MED ORDER — SIMETHICONE 80 MG PO CHEW: 80.0000 mg | CHEWABLE_TABLET | ORAL | Status: DC | PRN

## 2022-10-27 MED ORDER — LIDOCAINE 5 % EX PTCH
MEDICATED_PATCH | CUTANEOUS | Status: AC
  Filled 2022-10-27: qty 1

## 2022-10-27 MED ORDER — OXYTOCIN-SODIUM CHLORIDE 30-0.9 UT/500ML-% IV SOLN
INTRAVENOUS | Status: DC | PRN
  Administered 2022-10-27 (×2): 250 mL/h via INTRAVENOUS

## 2022-10-27 MED ORDER — ORAL CARE MOUTH RINSE: 15.0000 mL | Freq: Once | OROMUCOSAL | Status: AC

## 2022-10-27 MED ORDER — OXYCODONE HCL 5 MG PO TABS: 5.0000 mg | ORAL_TABLET | ORAL | Status: AC | PRN

## 2022-10-27 MED ORDER — FENTANYL CITRATE (PF) 100 MCG/2ML IJ SOLN
INTRAMUSCULAR | Status: AC
  Filled 2022-10-27: qty 2

## 2022-10-27 MED ORDER — COCONUT OIL OIL: 1.0000 | TOPICAL_OIL | Status: DC | PRN

## 2022-10-27 MED ORDER — NALOXONE HCL 4 MG/10ML IJ SOLN: 1.0000 ug/kg/h | INTRAVENOUS | Status: DC | PRN

## 2022-10-27 MED ORDER — KETOROLAC TROMETHAMINE 30 MG/ML IJ SOLN: 30.0000 mg | Freq: Four times a day (QID) | INTRAMUSCULAR | Status: DC

## 2022-10-27 MED ORDER — LIDOCAINE 5 % EX PTCH
MEDICATED_PATCH | CUTANEOUS | Status: DC | PRN
  Administered 2022-10-27: 1 via TRANSDERMAL

## 2022-10-27 MED ORDER — SOD CITRATE-CITRIC ACID 500-334 MG/5ML PO SOLN
ORAL | Status: AC
  Filled 2022-10-27: qty 15

## 2022-10-27 MED ORDER — ACETAMINOPHEN 325 MG PO TABS
650.0000 mg | ORAL_TABLET | Freq: Four times a day (QID) | ORAL | Status: DC
  Filled 2022-10-27: qty 2

## 2022-10-27 MED ORDER — BUPIVACAINE IN DEXTROSE 0.75-8.25 % IT SOLN
INTRATHECAL | Status: DC | PRN
  Administered 2022-10-27: 1.6 mL via INTRATHECAL

## 2022-10-27 MED ORDER — ACETAMINOPHEN 160 MG/5ML PO SOLN
650.0000 mg | Freq: Four times a day (QID) | ORAL | Status: DC
  Administered 2022-10-27 (×2): 650 mg via ORAL
  Filled 2022-10-27 (×3): qty 20.3

## 2022-10-27 MED ORDER — SODIUM CHLORIDE 0.9 % IV SOLN: INTRAVENOUS | Status: DC | PRN

## 2022-10-27 MED ORDER — WITCH HAZEL-GLYCERIN EX PADS: 1.0000 | MEDICATED_PAD | CUTANEOUS | Status: DC | PRN

## 2022-10-27 MED ORDER — PRENATAL MULTIVITAMIN CH
1.0000 | ORAL_TABLET | Freq: Every day | ORAL | Status: DC
  Filled 2022-10-27 (×2): qty 1

## 2022-10-27 MED ORDER — ONDANSETRON HCL 4 MG/2ML IJ SOLN
INTRAMUSCULAR | Status: DC | PRN
  Administered 2022-10-27: 4 mg via INTRAVENOUS

## 2022-10-27 MED ORDER — LIDOCAINE HCL (PF) 1 % IJ SOLN
INTRAMUSCULAR | Status: DC | PRN
  Administered 2022-10-27 (×3): 3 mL via SUBCUTANEOUS

## 2022-10-27 MED ORDER — DIPHENHYDRAMINE HCL 50 MG/ML IJ SOLN: 25.0000 mg | Freq: Four times a day (QID) | INTRAMUSCULAR | Status: DC | PRN

## 2022-10-27 MED ORDER — FENTANYL CITRATE (PF) 100 MCG/2ML IJ SOLN
INTRAMUSCULAR | Status: DC | PRN
  Administered 2022-10-27: 15 ug via INTRATHECAL

## 2022-10-27 MED ORDER — DIPHENHYDRAMINE HCL 25 MG PO CAPS: 25.0000 mg | ORAL_CAPSULE | Freq: Four times a day (QID) | ORAL | Status: DC | PRN

## 2022-10-27 MED ORDER — GABAPENTIN 300 MG PO CAPS
300.0000 mg | ORAL_CAPSULE | ORAL | Status: DC
  Filled 2022-10-27: qty 1

## 2022-10-27 MED ORDER — ONDANSETRON HCL 4 MG/2ML IJ SOLN: 4.0000 mg | Freq: Three times a day (TID) | INTRAMUSCULAR | Status: DC | PRN

## 2022-10-27 MED ORDER — CHLORHEXIDINE GLUCONATE 0.12 % MT SOLN
15.0000 mL | Freq: Once | OROMUCOSAL | Status: AC
  Administered 2022-10-27: 15 mL via OROMUCOSAL
  Filled 2022-10-27: qty 15

## 2022-10-27 MED ORDER — CEFAZOLIN SODIUM-DEXTROSE 2-4 GM/100ML-% IV SOLN
2.0000 g | INTRAVENOUS | Status: AC
  Administered 2022-10-27: 2 g via INTRAVENOUS
  Filled 2022-10-27: qty 100

## 2022-10-27 MED ORDER — MORPHINE SULFATE (PF) 0.5 MG/ML IJ SOLN
INTRAMUSCULAR | Status: DC | PRN
  Administered 2022-10-27: .1 mg via INTRATHECAL

## 2022-10-27 MED ORDER — BUPIVACAINE HCL (PF) 0.5 % IJ SOLN
INTRAMUSCULAR | Status: AC
  Filled 2022-10-27: qty 30

## 2022-10-27 SURGICAL SUPPLY — 32 items
APL PRP STRL LF DISP 70% ISPRP (MISCELLANEOUS) ×2
BAG COUNTER SPONGE SURGICOUNT (BAG) ×1 IMPLANT
BAG SPNG CNTER NS LX DISP (BAG) ×1
CHLORAPREP W/TINT 26 (MISCELLANEOUS) ×2 IMPLANT
DRSG TELFA 3X8 NADH STRL (GAUZE/BANDAGES/DRESSINGS) ×1 IMPLANT
ELECT REM PT RETURN 9FT ADLT (ELECTROSURGICAL) ×1
ELECTRODE REM PT RTRN 9FT ADLT (ELECTROSURGICAL) ×1 IMPLANT
EXTRT SYSTEM ALEXIS 17CM (MISCELLANEOUS)
GAUZE PAD ABD 8X10 STRL (GAUZE/BANDAGES/DRESSINGS) IMPLANT
GAUZE SPONGE 4X4 12PLY STRL (GAUZE/BANDAGES/DRESSINGS) ×1 IMPLANT
GLOVE BIO SURGEON STRL SZ 6.5 (GLOVE) ×1 IMPLANT
GLOVE INDICATOR 7.0 STRL GRN (GLOVE) ×1 IMPLANT
GOWN STRL REUS W/ TWL LRG LVL3 (GOWN DISPOSABLE) ×2 IMPLANT
GOWN STRL REUS W/TWL LRG LVL3 (GOWN DISPOSABLE) ×2
HEMOSTAT SURGICEL 2X3 (HEMOSTASIS) IMPLANT
KIT TURNOVER KIT A (KITS) ×1 IMPLANT
MANIFOLD NEPTUNE II (INSTRUMENTS) ×1 IMPLANT
MAT PREVALON FULL STRYKER (MISCELLANEOUS) ×1 IMPLANT
NS IRRIG 1000ML POUR BTL (IV SOLUTION) ×1 IMPLANT
PACK C SECTION AR (MISCELLANEOUS) ×1 IMPLANT
PAD OB MATERNITY 4.3X12.25 (PERSONAL CARE ITEMS) ×1 IMPLANT
PAD PREP OB/GYN DISP 24X41 (PERSONAL CARE ITEMS) ×1 IMPLANT
SCRUB CHG 4% DYNA-HEX 4OZ (MISCELLANEOUS) ×1 IMPLANT
SUCT VACUUM KIWI BELL (SUCTIONS) IMPLANT
SUT MNCRL AB 4-0 PS2 18 (SUTURE) ×1 IMPLANT
SUT VIC AB 0 CT1 36 (SUTURE) ×4 IMPLANT
SUT VIC AB 3-0 SH 27 (SUTURE) ×1
SUT VIC AB 3-0 SH 27X BRD (SUTURE) ×1 IMPLANT
SYSTEM CONTND EXTRCTN KII BLLN (MISCELLANEOUS) IMPLANT
TAPE SURG TRANSPORE 1 IN (GAUZE/BANDAGES/DRESSINGS) IMPLANT
TRAP FLUID SMOKE EVACUATOR (MISCELLANEOUS) ×1 IMPLANT
WATER STERILE IRR 500ML POUR (IV SOLUTION) ×1 IMPLANT

## 2022-10-27 NOTE — Op Note (Signed)
Cesarean Section Procedure Note  Indications: previous uterine incision: classical x 1, low transverse x 3   Pre-operative Diagnosis: 37 week 0 day pregnancy, history of prior C-section x 4 (including one classical C-section for abruption at 27 weeks), insufficient prenatal care, persistent trichomoniasis, mild gestational thrombocytopenia, undesired fertility, tobacco and marijuana abuse.  Post-operative Diagnosis: Same  Surgeon: Hildred Laser, MD  Assistants:  Brennan Bailey, MD  Procedure:  Repeat low transverse Cesarean Section (vacuum-assisted) with Bilateral Tubal Ligation  Anesthesia: Spinal anesthesia  Findings: Female infant, cephalic presentation, 2930 grams, with Apgar scores of 9 at one minute and 9 at five minutes. Intact placenta with 3 vessel cord.  Clear amniotic fluid at amniotomy The uterine outline, tubes and ovaries appeared normal.  Dense adhesions of the omentum to the muscle and anterior abdominal wall  Procedure Details: The patient was seen in the Holding Room. The risks, benefits, complications, treatment options, and expected outcomes were discussed with the patient.  The patient concurred with the proposed plan, giving informed consent.  The site of surgery properly noted/marked. The patient was taken to the Operating Room, identified as Maria Richmond and the procedure verified as C-Section Delivery. A Time Out was held and the above information confirmed.  After induction of anesthesia, the patient was draped and prepped in the usual sterile manner. Anesthesia was tested and noted to be adequate. A Pfannenstiel incision was made and carried down through the subcutaneous tissue to the fascia. Fascial incision was made and extended transversely. The fascia was separated from the underlying rectus tissue superiorly and inferiorly. The rectus muscles were separated in the midline using the bovie due to scar tissue present.  The omentum was noted to be densely  adherent to the underside of the muscle layer.  These adhesions were dissected using both blunt dissection and with the bovie. The peritoneum was identified and entered. Peritoneal incision was extended longitudinally. The surgical assist was able to provide retraction to allow for clear visualization of surgical site. An Alexis retractor was placed in the abdomen for additional retraction. The utero-vesical peritoneal reflection was incised transversely and the bladder flap was bluntly freed from the lower uterine segment. A low transverse uterine incision was made. Delivered from cephalic presentation was a 2930 gram Female with Apgar scores of 9 at one minute and 9 at five minutes.  The assistant worked to apply adequate fundal pressure, however due to the decreased pliability of the uterine tissue, the fetal head was unable to be delivered spontaneously. A bell-shaped Kiwi vacuum was applied to the fetal head and per manufacturer instructions and 1 pull was applied after adequate pressure had been achieved with the vacuum,  to allow for successful delivery of the fetus. No pop-offs occurred. After delivery, the umbilical cord was clamped and cut, cord blood was obtained for evaluation. Delayed cord clamping was observed. The placenta was removed intact and appeared normal. The uterus was exteriorized and cleared of all clots and debris. The uterine outline, tubes and ovaries appeared normal.  The uterine incision was closed with a running locked suture of 0-Vicryl.   Hemostasis was observed.   Attention was then turned to the fallopian tubes, and where the patient's right fallopian tube was identified and grasped with a Babcock clamp.  The tube was then followed out to the fimbria.  The Babcock clamp was then used to grasp the tube approximately 4 cm from the cornual region.  A 3 cm segment of tube was then ligated with  a free tie of 0-Chromic using the Pomeroy method and excised.  The left fallopian tube was  then ligated in a similar fashion and excised. The tubal lumens were cauterized bilaterally.  Good hemostasis was noted with bilateral fallopian tubes.  The uterus was then returned to the abdomen. There was slight oozing noted along the surface of the uterus in several areas. These sites were cauterized using the bovie. Surgicele was placed at the uterine incision site for added hemostasis. The pericolic gutters were cleared of all clots and debris. The fascia was then reapproximated with a running suture of 0-Vicryl. The subcutaneous fat layer was reapproximated with 2-0 Vicryl. The skin was reapproximated with 4-0 Monocryl. The incision was covered with steri-strips and a pressure dressing. A lidocaine patch was placed over the incision site.   Instrument, sponge, and needle counts were correct prior the abdominal closure and at the conclusion of the case.    An experienced assistant was required given the standard of surgical care given the complexity of the case.  This assistant was needed for exposure, dissection, suctioning, retraction, instrument exchange, and for overall help during the procedure.  Estimated Blood Loss:  510 ml      Drains: foley catheter to gravity drainage, 140 ml of clear urine at end of the procedure         Total IV Fluids:  Total I/O In: 1250 [I.V.:1150; IV Piggyback:100] Out: 650 [Urine:140; Blood:510] ml  Specimens: Segments of bilateral fallopian tubes         Implants: None         Complications:  None; patient tolerated the procedure well.         Disposition: PACU - hemodynamically stable.         Condition: stable   Hildred Laser, MD  OB/GYN at [facility] [place]

## 2022-10-27 NOTE — Anesthesia Procedure Notes (Addendum)
Spinal  Patient location during procedure: OR Start time: 10/27/2022 7:55 AM End time: 10/27/2022 8:09 AM Reason for block: surgical anesthesia Staffing Performed: resident/CRNA, anesthesiologist and other anesthesia staff  Anesthesiologist: Piscitello, Cleda Mccreedy, MD Resident/CRNA: Hezzie Bump, CRNA Performed by: Hezzie Bump, CRNA Authorized by: Rosaria Ferries, MD   Preanesthetic Checklist Completed: patient identified, IV checked, site marked, risks and benefits discussed, surgical consent, monitors and equipment checked, pre-op evaluation and timeout performed Spinal Block Patient position: sitting Prep: Betadine Patient monitoring: heart rate, continuous pulse ox, blood pressure and cardiac monitor Approach: midline Location: L4-5 Injection technique: single-shot Needle Needle type: Whitacre and Introducer  Needle gauge: 24 G Needle length: 9 cm Assessment Events: CSF return Additional Notes Multiple attempts by multiple providers. Patient was exquisitely tender during the procedure.  Negative paresthesia. Negative blood return. Positive free-flowing CSF. Expiration date of kit checked and confirmed. Patient tolerated procedure well, without complications.

## 2022-10-27 NOTE — Anesthesia Preprocedure Evaluation (Signed)
Anesthesia Evaluation  Patient identified by MRN, date of birth, ID band Patient awake    Reviewed: Allergy & Precautions, NPO status , Patient's Chart, lab work & pertinent test results  History of Anesthesia Complications Negative for: history of anesthetic complications  Airway Mallampati: III  TM Distance: >3 FB Neck ROM: full    Dental  (+) Chipped   Pulmonary neg pulmonary ROS   Pulmonary exam normal        Cardiovascular Exercise Tolerance: Good (-) hypertensionnegative cardio ROS Normal cardiovascular exam     Neuro/Psych  PSYCHIATRIC DISORDERS         GI/Hepatic ,GERD  Controlled,,  Endo/Other    Renal/GU   negative genitourinary   Musculoskeletal   Abdominal   Peds  Hematology negative hematology ROS (+)   Anesthesia Other Findings Past Medical History: No date: Depression No date: GERD (gastroesophageal reflux disease) No date: History of placental abruption No date: Preterm labor  Past Surgical History: 07/20/2014: CESAREAN SECTION 07/17/2016: CESAREAN SECTION; N/A     Comment:  Procedure: CESAREAN SECTION, Female, 6lb, 6oz;  Surgeon:               Conard Novak, MD;  Location: [facility] ORS;  Service:               Obstetrics;  Laterality: N/A; 12/18/2017: CESAREAN SECTION; N/A     Comment:  Procedure: CESAREAN SECTION;  Surgeon: Natale Milch, MD;  Location: [facility] ORS;  Service:               Obstetrics;  Laterality: N/A;  Time of Birth: 13:[birth year] Sex:              Female Wt: 6 lb 0 oz 10/04/2020: CESAREAN SECTION; N/A     Comment:  Procedure: CESAREAN SECTION;  Surgeon: Conard Novak, MD;  Location: [facility] ORS;  Service: Obstetrics;                Laterality: N/A; 2022: WISDOM TOOTH EXTRACTION     Comment:  two;  BMI    Body Mass Index: 36.21 kg/m      Reproductive/Obstetrics (+) Pregnancy                              Anesthesia Physical Anesthesia Plan  ASA: 2  Anesthesia Plan: Spinal   Post-op Pain Management:    Induction:   PONV Risk Score and Plan:   Airway Management Planned: Natural Airway and Nasal Cannula  Additional Equipment:   Intra-op Plan:   Post-operative Plan:   Informed Consent: I have reviewed the patients History and Physical, chart, labs and discussed the procedure including the risks, benefits and alternatives for the proposed anesthesia with the patient or authorized representative who has indicated his/her understanding and acceptance.     Dental Advisory Given  Plan Discussed with: Anesthesiologist  Anesthesia Plan Comments: (Patient reports no bleeding problems and no anticoagulant use.  Plan for spinal with backup GA  Patient consented for risks of anesthesia including but not limited to:  - adverse reactions to medications - damage to eyes, teeth, lips or other oral mucosa - nerve damage due to positioning  - risk of bleeding, infection and or nerve damage from spinal that could lead to paralysis -  risk of headache or failed spinal - damage to teeth, lips or other oral mucosa - sore throat or hoarseness - damage to heart, brain, nerves, lungs, other parts of body or loss of life  Patient voiced understanding and assent.)       Anesthesia Quick Evaluation

## 2022-10-27 NOTE — Transfer of Care (Signed)
Immediate Anesthesia Transfer of Care Note  Patient: Maria Richmond  Procedure(s) Performed: REPEAT CESAREAN SECTION (Bilateral) CESAREAN SECTION WITH BILATERAL TUBAL LIGATION (Bilateral)  Patient Location: Mother/Baby  Anesthesia Type:Spinal  Level of Consciousness: awake, alert , oriented, and patient cooperative  Airway & Oxygen Therapy: Patient Spontanous Breathing  Post-op Assessment: Report given to RN and Post -op Vital signs reviewed and stable  Post vital signs: Reviewed and stable  Last Vitals:  Vitals Value Taken Time  BP 153/90   Temp    Pulse 80   Resp 20   SpO2 99     Last Pain:  Vitals:   10/27/22 0604  PainSc: 0-No pain         Complications: No notable events documented.
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
## 2022-10-27 NOTE — Lactation Note (Signed)
This note was copied from a baby's chart. Lactation Consultation Note  Patient Name: Maria Richmond [id number] Date: 10/27/2022 Age:27 hours  Reason for consult: Initial assessment, early term baby   Maternal Data  This is mom's 4th baby,  repeat C/S vacuum assisted. Mom with history of  anxiety, depression, tobacco use (1 pack per day), Marijuana use (positive on 09/29/22 and 10/27/22).   On initial assessment mom reports she has latched and breastfed baby twice, she has also provided formula supplement. Mom reports she is tired and does not need lactation assistance as this time and welcomed someone return tomorrow. Provided LC number on white board if she  does need LC assistance today. Encouraged mom to call [facility] and/or care nurse for breastfeeding assistance as needed. Mom understands LC will f/u tomorrow.  Feeding Mother's feeding choice on admission: Breast and Formula   Consult Status  Follow-up 10/28/22 Inpatient  Update provided to care nurse   Maria Richmond 10/27/2022, 5:09 PM

## 2022-10-28 ENCOUNTER — Encounter: Payer: Self-pay | Admitting: Obstetrics and Gynecology

## 2022-10-28 LAB — CBC
HCT: 27.6 % — ABNORMAL LOW (ref 36.0–46.0)
Hemoglobin: 9.4 g/dL — ABNORMAL LOW (ref 12.0–15.0)
MCH: 29.4 pg (ref 26.0–34.0)
MCHC: 34.1 g/dL (ref 30.0–36.0)
MCV: 86.3 fL (ref 80.0–100.0)
Platelets: 104 10*3/uL — ABNORMAL LOW (ref 150–400)
RBC: 3.2 MIL/uL — ABNORMAL LOW (ref 3.87–5.11)
RDW: 13.1 % (ref 11.5–15.5)
WBC: 10.3 10*3/uL (ref 4.0–10.5)
nRBC: 0 % (ref 0.0–0.2)

## 2022-10-28 MED ORDER — MEASLES, MUMPS & RUBELLA VAC IJ SOLR
0.5000 mL | Freq: Once | INTRAMUSCULAR | Status: DC
  Filled 2022-10-28: qty 0.5

## 2022-10-28 MED ORDER — NICOTINE 14 MG/24HR TD PT24
14.0000 mg | MEDICATED_PATCH | Freq: Every day | TRANSDERMAL | Status: DC
  Administered 2022-10-29: 14 mg via TRANSDERMAL
  Filled 2022-10-28 (×2): qty 1

## 2022-10-28 MED ORDER — ACETAMINOPHEN 160 MG/5ML PO SOLN
650.0000 mg | Freq: Four times a day (QID) | ORAL | Status: DC
  Administered 2022-10-28 – 2022-10-29 (×3): 650 mg via ORAL
  Filled 2022-10-28 (×4): qty 20.3

## 2022-10-28 MED ORDER — VARICELLA VIRUS VACCINE LIVE 1350 PFU/0.5ML IJ SUSR
0.5000 mL | Freq: Once | INTRAMUSCULAR | Status: DC
  Filled 2022-10-28: qty 0.5

## 2022-10-28 MED ORDER — IBUPROFEN 100 MG/5ML PO SUSP: 600.0000 mg | Freq: Four times a day (QID) | ORAL | 1 refills | Status: DC | PRN

## 2022-10-28 MED ORDER — SENNOSIDES 8.8 MG/5ML PO SYRP: 10.0000 mL | ORAL_SOLUTION | Freq: Every evening | ORAL | 0 refills | Status: AC | PRN

## 2022-10-28 MED ORDER — OXYCODONE HCL 5 MG/5ML PO SOLN: 5.0000 mg | ORAL | 0 refills | Status: DC | PRN

## 2022-10-28 MED ORDER — ACETAMINOPHEN 160 MG/5ML PO SOLN: 650.0000 mg | Freq: Four times a day (QID) | ORAL | 1 refills | Status: DC

## 2022-10-28 MED ORDER — ACETAMINOPHEN 160 MG/5ML PO SOLN
650.0000 mg | Freq: Four times a day (QID) | ORAL | Status: DC
  Filled 2022-10-28: qty 20.3

## 2022-10-28 NOTE — Anesthesia Postprocedure Evaluation (Signed)
Anesthesia Post Note  Patient: Lisa-Marie Zhjmia Schoenfelder  Procedure(s) Performed: REPEAT CESAREAN SECTION (Bilateral) CESAREAN SECTION WITH BILATERAL TUBAL LIGATION (Bilateral)  Patient location during evaluation: Mother Baby Anesthesia Type: Spinal Level of consciousness: awake and alert Pain management: pain level controlled Vital Signs Assessment: post-procedure vital signs reviewed and stable Respiratory status: spontaneous breathing, nonlabored ventilation and respiratory function stable Cardiovascular status: stable Postop Assessment: no headache, no backache and epidural receding Anesthetic complications: no   No notable events documented.   Last Vitals:  Vitals:   10/27/22 2311 10/28/22 0315  BP: 125/77 123/85  Pulse: 76 72  Resp: 18 18  Temp: 37.1 C 36.9 C  SpO2: 98% 99%    Last Pain:  Vitals:   10/28/22 0315  TempSrc: Oral  PainSc:                  Jeanine Luz

## 2022-10-28 NOTE — Progress Notes (Signed)
Postpartum Day # 1: Cesarean Delivery (repeat) with BTL.   Subjective: Patient reports tolerating PO, + flatus, and no problems voiding.  Pain is well controlled. Is formula feeding. Notes bleeding is moderate flow, no passage of large clots.   Objective: Vital signs in last 24 hours: Temp:  [97.3 F (36.3 C)-98.7 F (37.1 C)] 98.4 F (36.9 C) (10/22 0315) Pulse Rate:  [69-98] 72 (10/22 0315) Resp:  [11-20] 18 (10/22 0315) BP: (123-158)/(77-111) 123/85 (10/22 0315) SpO2:  [94 %-99 %] 99 % (10/22 0315)  Physical Exam:  General: alert and no distress Lungs: clear to auscultation bilaterally Breasts: normal appearance, no masses or tenderness Heart: regular rate and rhythm, S1, S2 normal, no murmur, click, rub or gallop Abdomen: soft, non-tender; bowel sounds normal; no masses,  no organomegaly Pelvis: Lochia appropriate, Uterine Fundus firm, Incision: healing well, no significant drainage, no dehiscence, no significant erythema Extremities: DVT Evaluation: No evidence of DVT seen on physical exam. Negative Homan's sign. No cords or calf tenderness. No significant calf/ankle edema.  Recent Labs    10/27/22 1006 10/28/22 0523  HGB 11.6* 9.4*  HCT 34.4* 27.6*    Assessment/Plan: Status post Cesarean section. Doing well postoperatively.  Social Work consult, due to limited [facility]. Circumcision prior to discharge Contraception: immediate PP BTL during C-section Advance diet as tolerated Continue PO pain management Patient with mild to moderately elevated BPs for ~ 4 hours after her C-section. No prior h/o HTN. Continue to monitor, and treat if continued elevations occur.   Continue current care.  Patient considering discharging home today or tomorrow.    Hildred Laser, MD [facility] at [facility]

## 2022-10-28 NOTE — Anesthesia Post-op Follow-up Note (Signed)
  Anesthesia Pain Follow-up Note  Patient: Maria Richmond  Day #: 1  Date of Follow-up: 10/28/2022 Time: 7:53 AM  Last Vitals:  Vitals:   10/27/22 2311 10/28/22 0315  BP: 125/77 123/85  Pulse: 76 72  Resp: 18 18  Temp: 37.1 C 36.9 C  SpO2: 98% 99%    Level of Consciousness: alert  Pain: none   Side Effects:None  Catheter Site Exam:clean, dry     Plan: D/C from anesthesia care at surgeon's request  Itamar Mcgowan

## 2022-10-28 NOTE — Lactation Note (Addendum)
This note was copied from a baby's chart. Lactation Consultation Note  Patient Name: Boy Tirsa Sabir ZOXWR'U Date: 10/28/2022 Age:27 hours Reason for consult: Follow-up assessment;Early term 37-38.6wks;Other (Comment) (Discharge Education)   Maternal Data Lactation to room for follow up assessment and to provide discharge education. Mom stated that she other child would not and therefore she didn't breastfeed them.  This infant is latching.   Feeding Mother's Current Feeding Choice: Breast Milk and Formula Nipple Type: Slow - flow  No feeding observed.  Interventions Interventions: Education;CDC Guidelines for Breast Pump Cleaning  Discharge Discharge Education: Engorgement and breast care;Outpatient recommendation  Education on engorgement prevention/treatment was discussed as well as breastmilk storage guidelines. LC reviewed milk production expectations and making sure she feeds infant at least 8x w/in a 24hr period.  LC provided patient with a handout on breastmilk storage guidelines from [facility]. [facility] outpatient lactation services phone number written on the white board in the room.  Patient verbalized understanding.  Consult Status Consult Status: Complete    Yvette Rack Free 10/28/2022, 1:40 PM

## 2022-10-28 NOTE — Lactation Note (Signed)
This note was copied from a baby's chart. Lactation Consultation Note  Patient Name: Boy Deshawnda Mechem [id number] Date: 10/28/2022 Age:27 hours Reason for consult: Follow-up assessment;Early term 37-38.6wks  LC following up with patient, after a DEBP set up.  Patient stated she didn't know how to use it or when to use the pump.  Patient was feeding infant formula upon entry into the room.  Patient stated that infant is latching and feeding off both breast.    LC provided education on how to use the DEBP and how to use the manual pump.  Education was provided to patient on milk production expectations, burping infant in between feeds and following infants feeding cues.  Patient verbalized understanding.   Feeding Mother's Current Feeding Choice: Breast Milk and Formula  Lactation Tools Discussed/Used Tools: Pump Breast pump type: Double-Electric Breast Pump Pump Education: Setup, frequency, and cleaning Reason for Pumping: Patient verbalized wanting to pump.  Interventions Interventions: Breast feeding basics reviewed;DEBP;Education;CDC Guidelines for Breast Pump Cleaning  Discharge WIC Program: Yes  Consult Status Consult Status: Follow-up Follow-up type: In-patient    Yvette Rack Free 10/28/2022, 4:52 PM

## 2022-10-28 NOTE — Progress Notes (Signed)
TOC consult for high depression score. Consult reports patient lost her mother last week unexpectedly. CSW notes depression situational and warranted due to extreme life circumstances. Depression and therapy resources added to discharge paperwork.   Darolyn Rua, [place], MSW, [place] [phone]

## 2022-10-28 NOTE — Discharge Summary (Signed)
Postpartum Discharge Summary  Date of Service updated: 10/30/2022     Patient Name: Maria Richmond DOB: [birth year] MRN: [id number]  Date of admission: 10/27/2022 Delivery date:10/27/2022 Delivering provider: Hildred Laser Date of discharge: 10/30/2022  Admitting diagnosis: S/P cesarean section [Z98.891] Intrauterine pregnancy: [redacted]w[redacted]d     Secondary diagnosis:  Principal Problem:   S/P cesarean section Active Problems:   History of classical cesarean section   History of placental abruption   History of preterm delivery   Anxiety and depression   Marijuana use   Tobacco use in pregnancy   Trichomoniasis   Susceptible to varicella (non-immune), currently pregnant   Rubella non-immune status, antepartum  Additional problems: Undesired fertility, Elevated Edinburgh score    Discharge diagnosis: Term Pregnancy Delivered                                              Post partum procedures: None Augmentation: N/A Complications: None  Hospital course: Sceduled C/S   27 y.o. yo [id number] at [redacted]w[redacted]d was admitted to the hospital 10/27/2022 for scheduled cesarean section with the following indication:Elective Repeat.Delivery details are as follows:  Membrane Rupture Time/Date:  ,   Delivery Method:C-Section, Vacuum Assisted Operative Delivery:Device used:Kiwi bell vacuum Indication: Tissue dystocia.  Details of operation can be found in separate operative note.  She had elevated BPs PP and was started on Procardia XL 30 mg. Patient had elevated postpartum [place] screening, Social Work consult performed.   She is ambulating, tolerating a regular diet, passing flatus, and urinating well. Patient is discharged home in stable condition on  10/30/22        Newborn Data: Birth date:10/27/2022 Birth time:8:45 AM Gender:Female Living status:Living Apgars:9 ,9  Weight:2930 g    Magnesium Sulfate received: No BMZ received: No Rhophylac:No MMR: ordered T-DaP:Given prenatally Flu:  No, declined.  RSV Vaccine received: No Transfusion:No Immunizations administered: Immunization History  Administered Date(s) Administered   Hepatitis A 09/25/2006   Hepatitis B 03/31/1995, 11/11/1999, 07/22/2000   Tdap 05/14/2016, 09/29/2022    Physical exam  Vitals:   10/29/22 2112 10/29/22 2303 10/30/22 0316 10/30/22 0755  BP: (!) 154/102 (!) 141/98 135/86 (!) 148/98  Pulse: 85 96 76 89  Resp:  (!) 21 20 18   Temp:  98.7 F (37.1 C) 98.3 F (36.8 C) 98.5 F (36.9 C)  TempSrc:  Oral Oral Oral  SpO2: 100% 98% 98% 95%  Weight:      Height:       General: alert and no distress Lochia: appropriate Uterine Fundus: firm Incision: Healing well with no significant drainage, No significant erythema, Dressing is clean, dry, and intact DVT Evaluation: No evidence of DVT seen on physical exam. Negative Homan's sign. No cords or calf tenderness.   Labs: Lab Results  Component Value Date   WBC 7.1 10/29/2022   HGB 9.6 (L) 10/29/2022   HCT 28.6 (L) 10/29/2022   MCV 87.5 10/29/2022   PLT 117 (L) 10/29/2022      Latest Ref Rng & Units 10/29/2022    9:37 PM  CMP  Glucose 70 - 99 mg/dL 98   BUN 6 - 20 mg/dL 11   Creatinine 6.27 - 1.00 mg/dL 0.35   Sodium 009 - 381 mmol/L 136   Potassium 3.5 - 5.1 mmol/L 3.8   Chloride 98 - 111 mmol/L 101   CO2 22 -  32 mmol/L 27   Calcium 8.9 - 10.3 mg/dL 8.7   Total Protein 6.5 - 8.1 g/dL 6.2   Total Bilirubin 0.3 - 1.2 mg/dL 0.4   Alkaline Phos 38 - 126 U/L 83   AST 15 - 41 U/L 22   ALT 0 - 44 U/L 17    Edinburgh Score:    10/28/2022    1:17 PM  Edinburgh Postnatal Depression Scale Screening Tool  I have been able to laugh and see the funny side of things. 0  I have looked forward with enjoyment to things. 0  I have blamed myself unnecessarily when things went wrong. 3  I have been anxious or worried for no good reason. 3  I have felt scared or panicky for no good reason. 2  Things have been getting on top of me. 3  I have been  so unhappy that I have had difficulty sleeping. 3  I have felt sad or miserable. 3  I have been so unhappy that I have been crying. 1  The thought of harming myself has occurred to me. 0  Edinburgh Postnatal Depression Scale Total 18      After visit meds:    Allergies as of 10/30/2022   No Known Allergies      Medication List     TAKE these medications    acetaminophen 160 MG/5ML solution Commonly known as: TYLENOL Take 20.3 mLs (650 mg total) by mouth every 6 (six) hours.   ferrous sulfate 325 (65 FE) MG tablet Take 1 tablet (325 mg total) by mouth daily with breakfast. Start taking on: October 31, 2022   ibuprofen 100 MG/5ML suspension Commonly known as: ADVIL Take 30 mLs (600 mg total) by mouth every 6 (six) hours as needed for mild pain (pain score 1-3).   NIFEdipine 30 MG 24 hr tablet Commonly known as: ADALAT CC Take 1 tablet (30 mg total) by mouth daily. Start taking on: October 31, 2022   oxyCODONE 5 MG/5ML solution Commonly known as: ROXICODONE Take 5-10 mLs (5-10 mg total) by mouth every 4 (four) hours as needed for severe pain (pain score 7-10) or moderate pain (pain score 4-6).   Prenatal Gummies 0.18-25 MG Chew Chew 2 tablets by mouth daily.   sennosides 8.8 MG/5ML syrup Commonly known as: SENOKOT Take 10 mLs by mouth at bedtime as needed for mild constipation.                Discharge home in stable condition Infant Feeding: Bottle Infant Disposition:home with mother Discharge instruction: per After Visit Summary and Postpartum booklet. Activity: Advance as tolerated. Pelvic rest for 6 weeks.  Diet: routine diet Anticipated Birth Control: BTL done PP Postpartum Appointment:6 weeks Additional Postpartum F/U: Postpartum Depression checkup and Incision check 1 week. Office visit in 6 weeks. BP check on Monday, 11/03/22. Future Appointments: Future Appointments  Date Time Provider Department Center  11/05/2022 11:15 AM Hildred Laser,  MD [facility] None   Follow up Visit:  Follow-up Information     Hildred Laser, MD Follow up.   Specialties: Obstetrics and Gynecology, Radiology Why: Incision check in 1 week. Contact information: [address] [place] [address] [phone]                     10/30/2022 Glenetta Borg, CNM

## 2022-10-28 NOTE — Discharge Instructions (Signed)
Mental Health Resources for Therapy  [facility]- [place]  Make an appointment: [phone]  [facility] - Located in [facility]  Make an appointment: [phone]  [facility] [place]  Make an appointment: [phone]  [address]  [place], [place] [address]  For additional local or virtual therapy appointment options, you can visit [email or web address] and filter by insurance and city to find local providers. Provider profiles include specialties to find the best fit for you!  

## 2022-10-29 ENCOUNTER — Telehealth: Payer: Self-pay

## 2022-10-29 LAB — CBC WITH DIFFERENTIAL/PLATELET
Abs Immature Granulocytes: 0.04 10*3/uL (ref 0.00–0.07)
Basophils Absolute: 0 10*3/uL (ref 0.0–0.1)
Basophils Relative: 0 %
Eosinophils Absolute: 0.2 10*3/uL (ref 0.0–0.5)
Eosinophils Relative: 3 %
HCT: 28.6 % — ABNORMAL LOW (ref 36.0–46.0)
Hemoglobin: 9.6 g/dL — ABNORMAL LOW (ref 12.0–15.0)
Immature Granulocytes: 1 %
Lymphocytes Relative: 39 %
Lymphs Abs: 2.7 10*3/uL (ref 0.7–4.0)
MCH: 29.4 pg (ref 26.0–34.0)
MCHC: 33.6 g/dL (ref 30.0–36.0)
MCV: 87.5 fL (ref 80.0–100.0)
Monocytes Absolute: 0.6 10*3/uL (ref 0.1–1.0)
Monocytes Relative: 8 %
Neutro Abs: 3.5 10*3/uL (ref 1.7–7.7)
Neutrophils Relative %: 49 %
Platelets: 117 10*3/uL — ABNORMAL LOW (ref 150–400)
RBC: 3.27 MIL/uL — ABNORMAL LOW (ref 3.87–5.11)
RDW: 13.3 % (ref 11.5–15.5)
Smear Review: NORMAL
WBC: 7.1 10*3/uL (ref 4.0–10.5)
nRBC: 0 % (ref 0.0–0.2)

## 2022-10-29 LAB — COMPREHENSIVE METABOLIC PANEL
ALT: 17 U/L (ref 0–44)
AST: 22 U/L (ref 15–41)
Albumin: 2.8 g/dL — ABNORMAL LOW (ref 3.5–5.0)
Alkaline Phosphatase: 83 U/L (ref 38–126)
Anion gap: 8 (ref 5–15)
BUN: 11 mg/dL (ref 6–20)
CO2: 27 mmol/L (ref 22–32)
Calcium: 8.7 mg/dL — ABNORMAL LOW (ref 8.9–10.3)
Chloride: 101 mmol/L (ref 98–111)
Creatinine, Ser: 0.75 mg/dL (ref 0.44–1.00)
GFR, Estimated: >60 mL/min (ref >60)
Glucose, Bld: 98 mg/dL (ref 70–99)
Potassium: 3.8 mmol/L (ref 3.5–5.1)
Sodium: 136 mmol/L (ref 135–145)
Total Bilirubin: 0.4 mg/dL (ref 0.3–1.2)
Total Protein: 6.2 g/dL — ABNORMAL LOW (ref 6.5–8.1)

## 2022-10-29 LAB — GLUCOSE, CAPILLARY: Glucose-Capillary: 78 mg/dL (ref 70–99)

## 2022-10-29 MED ORDER — LABETALOL HCL 200 MG PO TABS: 200.0000 mg | ORAL_TABLET | Freq: Two times a day (BID) | ORAL | Status: DC

## 2022-10-29 MED ORDER — NIFEDIPINE ER OSMOTIC RELEASE 30 MG PO TB24
30.0000 mg | ORAL_TABLET | Freq: Every day | ORAL | Status: DC
  Administered 2022-10-29 – 2022-10-30 (×2): 30 mg via ORAL
  Filled 2022-10-29 (×2): qty 1

## 2022-10-29 MED ORDER — ACETAMINOPHEN 160 MG/5ML PO SOLN
650.0000 mg | Freq: Four times a day (QID) | ORAL | Status: DC
  Administered 2022-10-29 – 2022-10-30 (×3): 650 mg via ORAL
  Filled 2022-10-29 (×4): qty 20.3

## 2022-10-29 MED ORDER — LABETALOL HCL 100 MG PO TABS: 200.0000 mg | ORAL_TABLET | Freq: Two times a day (BID) | ORAL | Status: DC

## 2022-10-29 MED ORDER — ACETAMINOPHEN 160 MG/5ML PO SOLN
650.0000 mg | Freq: Four times a day (QID) | ORAL | Status: DC
  Administered 2022-10-29 (×2): 650 mg via ORAL
  Filled 2022-10-29 (×4): qty 20.3

## 2022-10-29 NOTE — Progress Notes (Signed)
Subjective: Postpartum Day 2: Cesarean Delivery Patient reports incisional pain, tolerating PO, + flatus, and no problems voiding.   She has been using a nicotine patch. Pain is controlled with oral medication. She would like to stay until tomorrow. Objective: Vital signs in last 24 hours: Temp:  [97.7 F (36.5 C)-98.7 F (37.1 C)] 98 F (36.7 C) (10/23 0759) Pulse Rate:  [70-88] 75 (10/23 0759) Resp:  [18-19] 18 (10/23 0759) BP: (110-139)/(60-90) 122/82 (10/23 0759) SpO2:  [97 %-99 %] 99 % (10/23 0759)  Physical Exam:  General: cooperative, fatigued, no distress, and mildly obese Lochia: appropriate Uterine Fundus: firm Incision: healing well, no significant drainage, honeycomb dressing in place is C, D, I DVT Evaluation: No evidence of DVT seen on physical exam. Negative Homan's sign. No cords or calf tenderness.  Recent Labs    10/27/22 1006 10/28/22 0523  HGB 11.6* 9.4*  HCT 34.4* 27.6*    Assessment/Plan: Status post Cesarean section. Doing well postoperatively.  Discharge home tomorrow.Mirna Mires, CNM 10/29/2022, 11:01 AM

## 2022-10-29 NOTE — Telephone Encounter (Signed)
According to CoverMyMeds it looks like this has already been sent in and we are waiting on response.

## 2022-10-29 NOTE — Progress Notes (Signed)
Patient ID: Maria Richmond, female   DOB: 07-08-1995, 27 y.o.   MRN: [id number] Contacted by the Postpartum unit due to patient complaining of dizziness and feeling unwell. Per the staff, her blood pressures are elevated: 161/96, 151/96, 154/102.  She denies headache, but rather c/o light headedness.   O:Orthostatic vital taken, no Orthostatic hypotension noted. Labs: [facility] panel ordered Results for orders placed or performed during the hospital encounter of 10/27/22 (from the past 24 hour(s))  Glucose, capillary     Status: None   Collection Time: 10/29/22  7:50 PM  Result Value Ref Range   Glucose-Capillary 78 70 - 99 mg/dL  Comprehensive metabolic panel     Status: Abnormal   Collection Time: 10/29/22  9:37 PM  Result Value Ref Range   Sodium 136 135 - 145 mmol/L   Potassium 3.8 3.5 - 5.1 mmol/L   Chloride 101 98 - 111 mmol/L   CO2 27 22 - 32 mmol/L   Glucose, Bld 98 70 - 99 mg/dL   BUN 11 6 - 20 mg/dL   Creatinine, Ser 4.69 0.44 - 1.00 mg/dL   Calcium 8.7 (L) 8.9 - 10.3 mg/dL   Total Protein 6.2 (L) 6.5 - 8.1 g/dL   Albumin 2.8 (L) 3.5 - 5.0 g/dL   AST 22 15 - 41 U/L   ALT 17 0 - 44 U/L   Alkaline Phosphatase 83 38 - 126 U/L   Total Bilirubin 0.4 0.3 - 1.2 mg/dL   GFR, Estimated >62 >95 mL/min   Anion gap 8 5 - 15  CBC with Differential/Platelet     Status: Abnormal   Collection Time: 10/29/22  9:37 PM  Result Value Ref Range   WBC 7.1 4.0 - 10.5 K/uL   RBC 3.27 (L) 3.87 - 5.11 MIL/uL   Hemoglobin 9.6 (L) 12.0 - 15.0 g/dL   HCT 28.4 (L) 13.2 - 44.0 %   MCV 87.5 80.0 - 100.0 fL   MCH 29.4 26.0 - 34.0 pg   MCHC 33.6 30.0 - 36.0 g/dL   RDW 10.2 72.5 - 36.6 %   Platelets 117 (L) 150 - 400 K/uL   nRBC 0.0 0.0 - 0.2 %   Neutrophils Relative % 49 %   Neutro Abs 3.5 1.7 - 7.7 K/uL   Lymphocytes Relative 39 %   Lymphs Abs 2.7 0.7 - 4.0 K/uL   Monocytes Relative 8 %   Monocytes Absolute 0.6 0.1 - 1.0 K/uL   Eosinophils Relative 3 %   Eosinophils Absolute 0.2 0.0 - 0.5  K/uL   Basophils Relative 0 %   Basophils Absolute 0.0 0.0 - 0.1 K/uL   WBC Morphology MORPHOLOGY UNREMARKABLE    RBC Morphology MORPHOLOGY UNREMARKABLE    Smear Review Normal platelet morphology    Immature Granulocytes 1 %   Abs Immature Granulocytes 0.04 0.00 - 0.07 K/uL    See vital sign Flowsheet for blood pressure readings  A: Post operative day #2, S/P repeat Cesarean section  Hypertension on Post operative Day #2.5 Normal PIH labs- awaiting UPC ratio value.  Plan: Dr. Valentino Saxon contacted and blood pressure readings conveyed. Will start patient on Procardia 30 XL to address the hypertension.  Mirna Mires, CNM  10/30/2022 12:01 AM

## 2022-10-30 ENCOUNTER — Ambulatory Visit: Payer: 59

## 2022-10-30 LAB — PROTEIN / CREATININE RATIO, URINE
Creatinine, Urine: 53 mg/dL
Total Protein, Urine: <6 mg/dL

## 2022-10-30 MED ORDER — NIFEDIPINE ER 30 MG PO TB24: 30.0000 mg | ORAL_TABLET | Freq: Every day | ORAL | 3 refills | Status: AC

## 2022-10-30 MED ORDER — INFLUENZA VIRUS VACC SPLIT PF (FLUZONE) 0.5 ML IM SUSY: 0.5000 mL | PREFILLED_SYRINGE | INTRAMUSCULAR | Status: DC

## 2022-10-30 MED ORDER — FERROUS SULFATE 325 (65 FE) MG PO TABS: 325.0000 mg | ORAL_TABLET | Freq: Every day | ORAL | 3 refills | Status: AC

## 2022-10-30 MED ORDER — FERROUS SULFATE 325 (65 FE) MG PO TABS: 325.0000 mg | ORAL_TABLET | Freq: Every day | ORAL | Status: DC

## 2022-10-30 NOTE — Telephone Encounter (Signed)
PA was approved patient has been notified.

## 2022-10-30 NOTE — Progress Notes (Signed)
Patient discharged home with family.  Discharge instructions, when to follow up, and prescriptions reviewed with patient.  Patient verbalized understanding. Patient will be escorted out by auxiliary.   

## 2022-10-30 NOTE — Lactation Note (Signed)
This note was copied from a baby's chart. Lactation Consultation Note  Patient Name: Maria Richmond [id number] Date: 10/30/2022 Age:27 hours Reason for consult: Follow-up assessment;Early term 37-38.6wks   Maternal Data Lactation to room for a follow up assessment w/ a 73hr old baby Maria "Adonis".  Patient stated that feedings have been going well.  She is noticing a change in her breast where they are feeling heavier.  Patient stated that she has not done any pumping since it was provided in her room.    Feeding Mother's Current Feeding Choice: Breast Milk and Formula  Interventions Interventions: Education  LC provided encouragement to mom on her positive breastfeeding experience so far.  Reminded patient to take all of her DEBP parts in case she receives a pump from [facility].  Patient verbalized understanding.   Consult Status Consult Status: Complete Follow-up type: Call as needed    Yvette Rack Free 10/30/2022, 9:59 AM

## 2022-10-30 NOTE — Final Progress Note (Signed)
Subjective: Postpartum Day 3: Cesarean Delivery Maria Richmond is feeling well overall. She is ambulating, voiding, has had a BM, and is tolerating POs. Her pain is well-controlled and her bleeding is WNL. She has concerns about depression and would like to be connected with a counselor. She is breastfeeding and bottle feeding without difficulty. She was started on Procardia last night for elevated BPs.   Objective: Vital signs in last 24 hours: Temp:  [97.9 F (36.6 C)-98.7 F (37.1 C)] 98.5 F (36.9 C) (10/24 0755) Pulse Rate:  [73-96] 89 (10/24 0755) Resp:  [18-21] 18 (10/24 0755) BP: (129-161)/(86-102) 148/98 (10/24 0755) SpO2:  [95 %-100 %] 95 % (10/24 0755)  Physical Exam:  General: alert, cooperative, and appears stated age Cardiac: RRR Lungs: CTAB Abdomen: soft, non-tender, normal BS Lochia: appropriate Uterine Fundus: firm Incision: healing well, no significant drainage, honeycomb dressing c/d/i DVT Evaluation: No evidence of DVT seen on physical exam.  Recent Labs    10/28/22 0523 10/29/22 2137  HGB 9.4* 9.6*  HCT 27.6* 28.6*    Assessment/Plan: Status post Cesarean section. Doing well postoperatively.  Discharge home this afternoon if BPs remain mild range. Continue Procardia XL 30 mg daily. PO iron Discharge instructions reviewed Contacted case manager Gayland Curry for referral to counselor. BP check on Monday, 11/03/22. Incision check in one week Office visit in 6 weeks  Glenetta Borg, CNM 10/30/2022, 11:20 AM

## 2022-10-31 LAB — SURGICAL PATHOLOGY

## 2022-10-31 NOTE — Progress Notes (Deleted)
NURSE VISIT NOTE  Subjective:    Patient ID: Maria Richmond, female    DOB: [birth year], 27 y.o.   MRN: [id number]  HPI  Patient is a 27 y.o. [id number] female who presents for BP check per order from Guadlupe Spanish, CNM.   Patient reports compliance with prescribed BP medications: yes Nifedipine  30mg  daily  Last dose of BP medication: {Elopement time:3044023}  BP Readings from Last 3 Encounters:  10/30/22 117/70  10/23/22 125/87  09/29/22 126/83   Pulse Readings from Last 3 Encounters:  10/30/22 92  10/23/22 92  09/29/22 91    Objective:    There were no vitals taken for this visit.  Assessment:   No diagnosis found.   Plan:   Per Dr. Andria Rhein Providers:28529}:  {JYNWGN:56213:Y}  Patient verbalized understanding of instructions.   Fonda Kinder, CMA

## 2022-11-01 LAB — VARICELLA ZOSTER ANTIBODY, IGG: Varicella IgG: NONREACTIVE

## 2022-11-03 ENCOUNTER — Telehealth: Payer: Self-pay | Admitting: Obstetrics and Gynecology

## 2022-11-03 ENCOUNTER — Ambulatory Visit: Payer: 59

## 2022-11-03 NOTE — Telephone Encounter (Signed)
Reached out to pt to reschedule BP check that was scheduled on 11/03/2022 at 10:30 with the nurse.   Left message for pt to call back.

## 2022-11-05 ENCOUNTER — Ambulatory Visit (INDEPENDENT_AMBULATORY_CARE_PROVIDER_SITE_OTHER): Payer: 59 | Admitting: Obstetrics and Gynecology

## 2022-11-05 ENCOUNTER — Encounter: Payer: Self-pay | Admitting: Obstetrics and Gynecology

## 2022-11-05 VITALS — BP 124/91 | HR 86 | Ht 62.0 in | Wt 177.9 lb

## 2022-11-05 DIAGNOSIS — Z98891 History of uterine scar from previous surgery: Secondary | ICD-10-CM

## 2022-11-05 DIAGNOSIS — Z4889 Encounter for other specified surgical aftercare: Secondary | ICD-10-CM

## 2022-11-05 DIAGNOSIS — F53 Postpartum depression: Secondary | ICD-10-CM | POA: Insufficient documentation

## 2022-11-05 DIAGNOSIS — O165 Unspecified maternal hypertension, complicating the puerperium: Secondary | ICD-10-CM | POA: Insufficient documentation

## 2022-11-05 MED ORDER — SERTRALINE HCL 50 MG PO TABS: 50.0000 mg | ORAL_TABLET | Freq: Every day | ORAL | 1 refills | Status: AC

## 2022-11-05 NOTE — Progress Notes (Signed)
OBSTETRICS/GYNECOLOGY POST-OPERATIVE CLINIC VISIT  Subjective:     Maria Richmond is a 27 y.o. female who presents to the clinic 1 weeks status post REPEAT CESAREAN SECTION CESAREAN SECTION WITH BILATERAL TUBAL LIGATION  for  h/o prior c/s x 4 . Eating a regular diet without difficulty. Bowel movements are normal. The patient is not having any pain. Is not having any bleeding. Is breast and bottle feeding.  Notes that she is feeling very stressed at home. Would like to return to work soon.  Also notes financial obligations she needs to meet and having to pay for day care. Notes that her mood is not well.  Postpartum depression screen is positive.  [place] score is 25.   Patient was initiated on Procardia 30 mg for postpartum HTN that developed while inpatient. Notes compliance with medication. Denies any undesirable side effects.   The following portions of the patient's history were reviewed and updated as appropriate: allergies, current medications, past family history, past medical history, past social history, past surgical history, and problem list.  Review of Systems Pertinent items are noted in HPI.   Objective:   BP (!) 124/91 (Patient Position: Sitting)   Pulse 86   Ht 5\' 2"  (1.575 m)   Wt 177 lb 14.4 oz (80.7 kg)   LMP 01/06/2022 (Approximate)   BMI 32.54 kg/m  Body mass index is 32.54 kg/m.  General:  alert and no distress  Abdomen: soft, bowel sounds active, non-tender  Incision:   healing well, no drainage, no erythema, no hernia, no seroma, no swelling, no dehiscence, incision well approximated       11/05/2022   11:26 AM 10/28/2022    1:17 PM 10/27/2022    2:30 PM 04/11/2022   10:33 AM 10/05/2020    9:30 PM  Edinburgh Postnatal Depression Scale Screening Tool  I have been able to laugh and see the funny side of things. 3 0 -- 0 1  I have looked forward with enjoyment to things. 2 0  0 2  I have blamed myself unnecessarily when things went wrong. 3 3   2 3   I have been anxious or worried for no good reason. 2 3  0 3  I have felt scared or panicky for no good reason. 3 2  0 1  Things have been getting on top of me. 3 3  0 2  I have been so unhappy that I have had difficulty sleeping. 3 3  0 3  I have felt sad or miserable. 3 3  0 3  I have been so unhappy that I have been crying. 3 1  0 3  The thought of harming myself has occurred to me. 0 0  0 0  Edinburgh Postnatal Depression Scale Total 25 18  2 21      Pathology:     1. Right and left fallopian tube segments,  :       - COMPLETE CROSS-SECTIONS OF BILATERAL BENIGN FALLOPIAN TUBES   Assessment:   Patient s/p REPEAT CESAREAN SECTION CESAREAN SECTION WITH BILATERAL TUBAL LIGATION (surgery)  Postpartum HTN Postpartum depression   Plan:   1. Continue any current medications as instructed by provider. 2. Wound care discussed. 3. Operative findings again reviewed. Pathology report discussed. 4. Activity restrictions: no bending, stooping, or squatting, no lifting more than 10-15 pounds, and pelvic rest 5. 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 Advised on use of postpartum recovery  garment while at work. 6. Continue Procardia until 6 weeks postpartum for PP HTN. 7. Lactating mother, breast and formula feeding. Doing well, no concerns at this time.   8. Discussion had with patient today regarding symptoms.  Likely suffering from postpartum depression.  Currently no SI/HI.  Patient notes that she has good support at home.  Advised on getting as much rest as possible and utilizing her support system as sleep deprivation can also intensify symptoms. Discussed options for management, including counseling and/or medications, or both.  Patient desires medication for now.  Will prescribe Zoloft 50 mg. To follow up in 4 weeks with postpartum visit.  Patient seen by Social Work today.  9. Follow up: 5 weeks for  week postpartum care.    Hildred Laser,  MD [facility]

## 2022-11-05 NOTE — Patient Instructions (Signed)

## 2022-11-10 NOTE — Telephone Encounter (Signed)
Pt was seen on 11/05/2022 with Dr. Valentino Saxon.

## 2022-12-10 ENCOUNTER — Ambulatory Visit: Payer: 59 | Admitting: Obstetrics and Gynecology

## 2022-12-11 ENCOUNTER — Telehealth: Payer: Self-pay | Admitting: Obstetrics and Gynecology

## 2022-12-11 ENCOUNTER — Ambulatory Visit: Payer: 59 | Admitting: Obstetrics and Gynecology

## 2022-12-11 NOTE — Telephone Encounter (Signed)
Reached out to pt to reschedule 6 week pp visit that was scheduled with Dr. Valentino Saxon on 12/11/2022 at 3:35.  Left message for pt to call back to reschedule.

## 2022-12-11 NOTE — Progress Notes (Unsigned)
   OBSTETRICS POSTPARTUM CLINIC PROGRESS NOTE  Subjective:     Maria Richmond is a 27 y.o. [phone] female who presents for a postpartum visit. She is 6 weeks postpartum following a C-Section, Vacuum Assisted . I have fully reviewed the prenatal and intrapartum course. The delivery was at 37.0 gestational weeks.  Anesthesia: spinal. Postpartum course has been ***. Baby's course has been ***. Baby is feeding by {breast/bottle:69}. Bleeding: patient {HAS HAS AVW:09811} not resumed menses, with No LMP recorded.. Bowel function is {normal:32111}. Bladder function is {normal:32111}. Patient {is/is not:9024} sexually active. Contraception method desired is tubal ligation. Postpartum depression screening: {neg default:13464::"negative"}.  EDPS score is ***.    The following portions of the patient's history were reviewed and updated as appropriate: allergies, current medications, past family history, past medical history, past social history, past surgical history, and problem list.  Review of Systems {ros; complete:30496}   Objective:    There were no vitals taken for this visit.  General:  alert and no distress   Breasts:  inspection negative, no nipple discharge or bleeding, no masses or nodularity palpable  Lungs: clear to auscultation bilaterally  Heart:  regular rate and rhythm, S1, S2 normal, no murmur, click, rub or gallop  Abdomen: soft, non-tender; bowel sounds normal; no masses,  no organomegaly.  ***Well healed Pfannenstiel incision   Vulva:  normal  Vagina: normal vagina, no discharge, exudate, lesion, or erythema  Cervix:  no cervical motion tenderness and no lesions  Corpus: normal size, contour, position, consistency, mobility, non-tender  Adnexa:  normal adnexa and no mass, fullness, tenderness  Rectal Exam: Not performed.         Labs:  Lab Results  Component Value Date   HGB 9.6 (L) 10/29/2022     Assessment:   No diagnosis found.   Plan:    1.  Contraception: tubal ligation 2. Will check Hgb for h/o postpartum anemia of less than 10.  3. Follow up in: {1-10:13787} {time; units:19136} or as needed.    Hildred Laser, MD [facility]

## 2022-12-12 ENCOUNTER — Encounter: Payer: Self-pay | Admitting: Obstetrics and Gynecology

## 2022-12-12 NOTE — Telephone Encounter (Signed)
Reached out to pt (2x) to reschedule 6 week pp visit that was scheduled with Dr. Valentino Saxon on 12/11/2022 at 3:35.  Could not leave a message, never went to voicemail.  Will send a MyChart letter.

## 2023-11-02 ENCOUNTER — Other Ambulatory Visit: Payer: Self-pay

## 2023-11-02 ENCOUNTER — Emergency Department
Admission: EM | Admit: 2023-11-02 | Discharge: 2023-11-02 | Disposition: A | Discharge status: Home / Self Care | Payer: MEDICAID | Attending: Emergency Medicine | Admitting: Emergency Medicine

## 2023-11-02 DIAGNOSIS — J069 Acute upper respiratory infection, unspecified: Secondary | ICD-10-CM | POA: Insufficient documentation

## 2023-11-02 DIAGNOSIS — R059 Cough, unspecified: Secondary | ICD-10-CM | POA: Diagnosis present

## 2023-11-02 LAB — RESP PANEL BY RT-PCR (RSV, FLU A&B, COVID)  RVPGX2
Influenza A by PCR: NEGATIVE
Influenza B by PCR: NEGATIVE
Resp Syncytial Virus by PCR: NEGATIVE
SARS Coronavirus 2 by RT PCR: NEGATIVE

## 2023-11-02 LAB — GROUP A STREP BY PCR: Group A Strep by PCR: NOT DETECTED

## 2023-11-02 NOTE — ED Notes (Signed)
 See triage note  Presents with sore throat  States she developed sore throat yesterday  Afebrile on arrival

## 2023-11-02 NOTE — ED Triage Notes (Signed)
 Pt comes with sore throat, cough and congestion that started last night. Pt states no fevers.

## 2023-11-02 NOTE — Discharge Instructions (Signed)
 Your COVID, flu, RSV, and strep tests are negative.  You may continue to take Tylenol /ibuprofen  per package instructions to help with your symptoms.  Please return for any new, worsening, or changing symptoms or other concerns.  Member that you are likely highly contagious to others.
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
